# Patient Record
Sex: Female | Born: 1955 | Race: White | Hispanic: No | Smoking: Former smoker
Health system: Southern US, Community
[De-identification: ages and names within clinical notes are randomized; demographics above are authoritative.]

## PROBLEM LIST (undated history)

## (undated) DIAGNOSIS — E785 Hyperlipidemia, unspecified: Secondary | ICD-10-CM

## (undated) DIAGNOSIS — IMO0002 Reserved for concepts with insufficient information to code with codable children: Secondary | ICD-10-CM

## (undated) DIAGNOSIS — R229 Localized swelling, mass and lump, unspecified: Secondary | ICD-10-CM

## (undated) DIAGNOSIS — M1990 Unspecified osteoarthritis, unspecified site: Secondary | ICD-10-CM

## (undated) DIAGNOSIS — M25561 Pain in right knee: Secondary | ICD-10-CM

## (undated) DIAGNOSIS — F419 Anxiety disorder, unspecified: Secondary | ICD-10-CM

## (undated) DIAGNOSIS — D589 Hereditary hemolytic anemia, unspecified: Secondary | ICD-10-CM

## (undated) DIAGNOSIS — R011 Cardiac murmur, unspecified: Secondary | ICD-10-CM

## (undated) DIAGNOSIS — Z8489 Family history of other specified conditions: Secondary | ICD-10-CM

## (undated) DIAGNOSIS — I878 Other specified disorders of veins: Secondary | ICD-10-CM

## (undated) DIAGNOSIS — M25562 Pain in left knee: Secondary | ICD-10-CM

## (undated) DIAGNOSIS — I10 Essential (primary) hypertension: Secondary | ICD-10-CM

## (undated) DIAGNOSIS — R600 Localized edema: Secondary | ICD-10-CM

## (undated) DIAGNOSIS — K611 Rectal abscess: Secondary | ICD-10-CM

## (undated) DIAGNOSIS — J45909 Unspecified asthma, uncomplicated: Secondary | ICD-10-CM

## (undated) DIAGNOSIS — M2550 Pain in unspecified joint: Secondary | ICD-10-CM

## (undated) DIAGNOSIS — G8929 Other chronic pain: Secondary | ICD-10-CM

## (undated) HISTORY — DX: Other specified disorders of veins: I87.8

## (undated) HISTORY — DX: Unspecified osteoarthritis, unspecified site: M19.90

## (undated) HISTORY — DX: Pain in unspecified joint: M25.50

## (undated) HISTORY — DX: Essential (primary) hypertension: I10

## (undated) HISTORY — DX: Cardiac murmur, unspecified: R01.1

## (undated) HISTORY — DX: Localized edema: R60.0

## (undated) HISTORY — PX: COLONOSCOPY: SHX174

## (undated) HISTORY — DX: Hyperlipidemia, unspecified: E78.5

## (undated) HISTORY — DX: Rectal abscess: K61.1

## (undated) HISTORY — DX: Other chronic pain: G89.29

---

## 1999-09-15 ENCOUNTER — Emergency Department (HOSPITAL_COMMUNITY): Admission: EM | Admit: 1999-09-15 | Discharge: 1999-09-15 | Payer: Self-pay | Admitting: Internal Medicine

## 1999-09-15 ENCOUNTER — Encounter: Payer: Self-pay | Admitting: Internal Medicine

## 2000-01-17 ENCOUNTER — Other Ambulatory Visit: Admission: RE | Admit: 2000-01-17 | Discharge: 2000-01-17 | Payer: Self-pay | Admitting: Internal Medicine

## 2001-03-01 ENCOUNTER — Other Ambulatory Visit: Admission: RE | Admit: 2001-03-01 | Discharge: 2001-03-01 | Payer: Self-pay | Admitting: Obstetrics & Gynecology

## 2001-03-09 ENCOUNTER — Encounter (INDEPENDENT_AMBULATORY_CARE_PROVIDER_SITE_OTHER): Payer: Self-pay | Admitting: Specialist

## 2001-03-09 ENCOUNTER — Other Ambulatory Visit: Admission: RE | Admit: 2001-03-09 | Discharge: 2001-03-09 | Payer: Self-pay | Admitting: Obstetrics & Gynecology

## 2001-07-26 ENCOUNTER — Other Ambulatory Visit: Admission: RE | Admit: 2001-07-26 | Discharge: 2001-07-26 | Payer: Self-pay | Admitting: Obstetrics & Gynecology

## 2002-05-20 ENCOUNTER — Encounter: Payer: Self-pay | Admitting: Internal Medicine

## 2002-05-20 ENCOUNTER — Encounter: Admission: RE | Admit: 2002-05-20 | Discharge: 2002-05-20 | Payer: Self-pay | Admitting: Internal Medicine

## 2002-11-29 ENCOUNTER — Emergency Department (HOSPITAL_COMMUNITY): Admission: EM | Admit: 2002-11-29 | Discharge: 2002-11-29 | Payer: Self-pay | Admitting: Emergency Medicine

## 2002-11-29 ENCOUNTER — Encounter: Payer: Self-pay | Admitting: Emergency Medicine

## 2006-08-04 ENCOUNTER — Encounter: Admission: RE | Admit: 2006-08-04 | Discharge: 2006-08-04 | Payer: Self-pay | Admitting: Orthopedic Surgery

## 2008-03-06 ENCOUNTER — Ambulatory Visit: Payer: Self-pay | Admitting: Cardiovascular Disease

## 2008-03-06 ENCOUNTER — Inpatient Hospital Stay (HOSPITAL_COMMUNITY): Admission: EM | Admit: 2008-03-06 | Discharge: 2008-03-12 | Payer: Self-pay | Admitting: Emergency Medicine

## 2008-03-06 ENCOUNTER — Ambulatory Visit: Payer: Self-pay | Admitting: Oncology

## 2008-03-08 ENCOUNTER — Encounter (INDEPENDENT_AMBULATORY_CARE_PROVIDER_SITE_OTHER): Payer: Self-pay | Admitting: Internal Medicine

## 2008-03-13 ENCOUNTER — Ambulatory Visit: Payer: Self-pay | Admitting: Oncology

## 2008-03-17 LAB — CBC & DIFF AND RETIC
Basophils Absolute: 0 10*3/uL (ref 0.0–0.1)
EOS%: 0.2 % (ref 0.0–7.0)
Eosinophils Absolute: 0 10*3/uL (ref 0.0–0.5)
HCT: 32.8 % — ABNORMAL LOW (ref 34.8–46.6)
HGB: 11.7 g/dL (ref 11.6–15.9)
IRF: 0.41 — ABNORMAL HIGH (ref 0.130–0.330)
MCH: 36.1 pg — ABNORMAL HIGH (ref 26.0–34.0)
MCV: 101.3 fL — ABNORMAL HIGH (ref 81.0–101.0)
NEUT#: 10.1 10*3/uL — ABNORMAL HIGH (ref 1.5–6.5)
NEUT%: 92.9 % — ABNORMAL HIGH (ref 39.6–76.8)
RETIC #: 177.9 10*3/uL — ABNORMAL HIGH (ref 19.7–115.1)
lymph#: 0.5 10*3/uL — ABNORMAL LOW (ref 0.9–3.3)

## 2008-03-17 LAB — LACTATE DEHYDROGENASE: LDH: 531 U/L — ABNORMAL HIGH (ref 94–250)

## 2008-03-17 LAB — COMPREHENSIVE METABOLIC PANEL
ALT: 65 U/L — ABNORMAL HIGH (ref 0–35)
AST: 48 U/L — ABNORMAL HIGH (ref 0–37)
Albumin: 4.1 g/dL (ref 3.5–5.2)
CO2: 27 mEq/L (ref 19–32)
Calcium: 10.1 mg/dL (ref 8.4–10.5)
Chloride: 99 mEq/L (ref 96–112)
Creatinine, Ser: 1.21 mg/dL — ABNORMAL HIGH (ref 0.40–1.20)
Potassium: 4.4 mEq/L (ref 3.5–5.3)
Total Protein: 6.7 g/dL (ref 6.0–8.3)

## 2008-03-17 LAB — MORPHOLOGY

## 2008-04-06 LAB — CBC & DIFF AND RETIC
BASO%: 0.2 % (ref 0.0–2.0)
EOS%: 1.8 % (ref 0.0–7.0)
Eosinophils Absolute: 0.2 10*3/uL (ref 0.0–0.5)
LYMPH%: 15.7 % (ref 14.0–48.0)
MCH: 34.9 pg — ABNORMAL HIGH (ref 26.0–34.0)
MCHC: 35.1 g/dL (ref 32.0–36.0)
MCV: 99.6 fL (ref 81.0–101.0)
MONO%: 5.1 % (ref 0.0–13.0)
NEUT#: 6.4 10*3/uL (ref 1.5–6.5)
Platelets: 204 10*3/uL (ref 145–400)
RBC: 3.82 10*6/uL (ref 3.70–5.32)
RDW: 16.7 % — ABNORMAL HIGH (ref 11.3–14.5)
RETIC #: 98.2 10*3/uL (ref 19.7–115.1)
Retic %: 2.6 % — ABNORMAL HIGH (ref 0.4–2.3)

## 2008-04-06 LAB — MORPHOLOGY

## 2008-04-14 LAB — CBC & DIFF AND RETIC
BASO%: 0.3 % (ref 0.0–2.0)
Eosinophils Absolute: 0.1 10*3/uL (ref 0.0–0.5)
HCT: 39.6 % (ref 34.8–46.6)
LYMPH%: 15.7 % (ref 14.0–48.0)
MCHC: 35.2 g/dL (ref 32.0–36.0)
MCV: 98.9 fL (ref 81.0–101.0)
MONO#: 0.3 10*3/uL (ref 0.1–0.9)
MONO%: 2.7 % (ref 0.0–13.0)
NEUT%: 80.2 % — ABNORMAL HIGH (ref 39.6–76.8)
Platelets: 196 10*3/uL (ref 145–400)
WBC: 10.5 10*3/uL — ABNORMAL HIGH (ref 3.9–10.0)

## 2008-04-14 LAB — MORPHOLOGY

## 2008-04-14 LAB — HAPTOGLOBIN: Haptoglobin: 87 mg/dL (ref 16–200)

## 2008-04-21 LAB — CBC & DIFF AND RETIC
Basophils Absolute: 0 10*3/uL (ref 0.0–0.1)
EOS%: 0.2 % (ref 0.0–7.0)
Eosinophils Absolute: 0 10*3/uL (ref 0.0–0.5)
HCT: 37.6 % (ref 34.8–46.6)
HGB: 13.2 g/dL (ref 11.6–15.9)
IRF: 0.47 — ABNORMAL HIGH (ref 0.130–0.330)
MCH: 34.3 pg — ABNORMAL HIGH (ref 26.0–34.0)
MONO#: 0.5 10*3/uL (ref 0.1–0.9)
NEUT#: 9.5 10*3/uL — ABNORMAL HIGH (ref 1.5–6.5)
NEUT%: 80.5 % — ABNORMAL HIGH (ref 39.6–76.8)
lymph#: 1.8 10*3/uL (ref 0.9–3.3)

## 2008-04-21 LAB — MORPHOLOGY: PLT EST: ADEQUATE

## 2008-04-21 LAB — CHCC SMEAR

## 2008-04-28 ENCOUNTER — Ambulatory Visit: Payer: Self-pay | Admitting: Oncology

## 2008-04-28 LAB — CBC & DIFF AND RETIC
BASO%: 0.6 % (ref 0.0–2.0)
EOS%: 0.4 % (ref 0.0–7.0)
LYMPH%: 18.1 % (ref 14.0–48.0)
MCH: 33.4 pg (ref 26.0–34.0)
MCHC: 34.6 g/dL (ref 32.0–36.0)
MCV: 96.4 fL (ref 81.0–101.0)
MONO%: 4.5 % (ref 0.0–13.0)
Platelets: 253 10*3/uL (ref 145–400)
RBC: 3.87 10*6/uL (ref 3.70–5.32)
Retic %: 3.1 % — ABNORMAL HIGH (ref 0.4–2.3)

## 2008-04-28 LAB — COMPREHENSIVE METABOLIC PANEL
CO2: 28 mEq/L (ref 19–32)
Creatinine, Ser: 1.22 mg/dL — ABNORMAL HIGH (ref 0.40–1.20)
Glucose, Bld: 86 mg/dL (ref 70–99)
Sodium: 145 mEq/L (ref 135–145)
Total Bilirubin: 1.1 mg/dL (ref 0.3–1.2)
Total Protein: 6.1 g/dL (ref 6.0–8.3)

## 2008-05-10 LAB — CBC & DIFF AND RETIC
BASO%: 0.6 % (ref 0.0–2.0)
Basophils Absolute: 0.1 10*3/uL (ref 0.0–0.1)
HCT: 40.9 % (ref 34.8–46.6)
LYMPH%: 19.9 % (ref 14.0–48.0)
MCH: 32.7 pg (ref 26.0–34.0)
MCHC: 34.8 g/dL (ref 32.0–36.0)
MONO#: 0.4 10*3/uL (ref 0.1–0.9)
NEUT%: 74.9 % (ref 39.6–76.8)
Platelets: 240 10*3/uL (ref 145–400)
WBC: 9.6 10*3/uL (ref 3.9–10.0)

## 2008-05-10 LAB — BASIC METABOLIC PANEL
Calcium: 9.8 mg/dL (ref 8.4–10.5)
Chloride: 97 mEq/L (ref 96–112)
Creatinine, Ser: 1.13 mg/dL (ref 0.40–1.20)

## 2008-05-10 LAB — LACTATE DEHYDROGENASE: LDH: 306 U/L — ABNORMAL HIGH (ref 94–250)

## 2008-05-25 LAB — CBC & DIFF AND RETIC
BASO%: 0.4 % (ref 0.0–2.0)
LYMPH%: 23.6 % (ref 14.0–48.0)
MCH: 32.2 pg (ref 26.0–34.0)
MCHC: 35.1 g/dL (ref 32.0–36.0)
MCV: 91.7 fL (ref 81.0–101.0)
MONO%: 8.2 % (ref 0.0–13.0)
Platelets: 199 10*3/uL (ref 145–400)
RBC: 4.45 10*6/uL (ref 3.70–5.32)
Retic %: 3.2 % — ABNORMAL HIGH (ref 0.4–2.3)

## 2008-05-25 LAB — LACTATE DEHYDROGENASE: LDH: 288 U/L — ABNORMAL HIGH (ref 94–250)

## 2008-05-25 LAB — MORPHOLOGY

## 2008-05-31 ENCOUNTER — Ambulatory Visit: Payer: Self-pay | Admitting: Oncology

## 2008-06-08 LAB — CBC & DIFF AND RETIC
BASO%: 0.7 % (ref 0.0–2.0)
Eosinophils Absolute: 0.1 10*3/uL (ref 0.0–0.5)
LYMPH%: 20 % (ref 14.0–48.0)
MCHC: 34 g/dL (ref 32.0–36.0)
MONO#: 0.5 10*3/uL (ref 0.1–0.9)
NEUT#: 5.1 10*3/uL (ref 1.5–6.5)
RBC: 4.35 10*6/uL (ref 3.70–5.32)
RDW: 14.6 % — ABNORMAL HIGH (ref 11.3–14.5)
RETIC #: 110.1 10*3/uL (ref 19.7–115.1)
Retic %: 2.5 % — ABNORMAL HIGH (ref 0.4–2.3)
WBC: 7.3 10*3/uL (ref 3.9–10.0)
lymph#: 1.5 10*3/uL (ref 0.9–3.3)

## 2008-06-08 LAB — MORPHOLOGY: PLT EST: ADEQUATE

## 2008-06-15 LAB — CBC & DIFF AND RETIC
Eosinophils Absolute: 0.1 10*3/uL (ref 0.0–0.5)
MCV: 92.7 fL (ref 81.0–101.0)
MONO%: 8.1 % (ref 0.0–13.0)
NEUT#: 4 10*3/uL (ref 1.5–6.5)
RBC: 4.49 10*6/uL (ref 3.70–5.32)
RDW: 14.7 % — ABNORMAL HIGH (ref 11.3–14.5)
RETIC #: 113.6 10*3/uL (ref 19.7–115.1)
Retic %: 2.5 % — ABNORMAL HIGH (ref 0.4–2.3)
WBC: 6.2 10*3/uL (ref 3.9–10.0)

## 2008-06-29 LAB — CBC & DIFF AND RETIC
BASO%: 0.3 % (ref 0.0–2.0)
LYMPH%: 25.8 % (ref 14.0–48.0)
MCHC: 34.6 g/dL (ref 32.0–36.0)
MCV: 90.5 fL (ref 81.0–101.0)
MONO%: 8.8 % (ref 0.0–13.0)
Platelets: 191 10*3/uL (ref 145–400)
RBC: 4.57 10*6/uL (ref 3.70–5.32)
Retic %: 2 % (ref 0.4–2.3)
WBC: 6.3 10*3/uL (ref 3.9–10.0)

## 2008-07-06 LAB — CBC & DIFF AND RETIC
BASO%: 0.7 % (ref 0.0–2.0)
EOS%: 3.5 % (ref 0.0–7.0)
HCT: 41.5 % (ref 34.8–46.6)
LYMPH%: 27.4 % (ref 14.0–48.0)
MCH: 31 pg (ref 26.0–34.0)
MCHC: 34.2 g/dL (ref 32.0–36.0)
NEUT%: 60.2 % (ref 39.6–76.8)
Platelets: 193 10*3/uL (ref 145–400)

## 2008-07-11 ENCOUNTER — Ambulatory Visit: Payer: Self-pay | Admitting: Oncology

## 2008-07-13 LAB — CBC & DIFF AND RETIC
Basophils Absolute: 0 10*3/uL (ref 0.0–0.1)
Eosinophils Absolute: 0.2 10*3/uL (ref 0.0–0.5)
HCT: 41.6 % (ref 34.8–46.6)
HGB: 14.1 g/dL (ref 11.6–15.9)
IRF: 0.43 — ABNORMAL HIGH (ref 0.130–0.330)
MCV: 90.3 fL (ref 81.0–101.0)
MONO%: 7.2 % (ref 0.0–13.0)
NEUT#: 4.6 10*3/uL (ref 1.5–6.5)
NEUT%: 68.6 % (ref 39.6–76.8)
RDW: 14.3 % (ref 11.3–14.5)
RETIC #: 72.8 10*3/uL (ref 19.7–115.1)
lymph#: 1.4 10*3/uL (ref 0.9–3.3)

## 2008-07-21 LAB — FERRITIN: Ferritin: 219 ng/mL (ref 10–291)

## 2008-07-21 LAB — IRON AND TIBC
%SAT: 26 % (ref 20–55)
Iron: 76 ug/dL (ref 42–145)
TIBC: 295 ug/dL (ref 250–470)

## 2008-07-21 LAB — COMPREHENSIVE METABOLIC PANEL
Albumin: 4 g/dL (ref 3.5–5.2)
Alkaline Phosphatase: 82 U/L (ref 39–117)
BUN: 23 mg/dL (ref 6–23)
Glucose, Bld: 100 mg/dL — ABNORMAL HIGH (ref 70–99)
Total Bilirubin: 1 mg/dL (ref 0.3–1.2)

## 2008-07-21 LAB — CBC & DIFF AND RETIC
BASO%: 0.3 % (ref 0.0–2.0)
EOS%: 3.5 % (ref 0.0–7.0)
LYMPH%: 22.8 % (ref 14.0–48.0)
MCHC: 33.8 g/dL (ref 32.0–36.0)
MCV: 90.3 fL (ref 81.0–101.0)
MONO%: 7.1 % (ref 0.0–13.0)
Platelets: 189 10*3/uL (ref 145–400)
RBC: 4.53 10*6/uL (ref 3.70–5.32)
Retic %: 2.1 % (ref 0.4–2.3)

## 2008-07-21 LAB — MORPHOLOGY

## 2008-08-21 LAB — CBC WITH DIFFERENTIAL/PLATELET
BASO%: 0.3 % (ref 0.0–2.0)
HCT: 41 % (ref 34.8–46.6)
LYMPH%: 27.3 % (ref 14.0–48.0)
MCH: 30.2 pg (ref 26.0–34.0)
MCHC: 33.9 g/dL (ref 32.0–36.0)
MCV: 89.2 fL (ref 81.0–101.0)
MONO#: 0.5 10*3/uL (ref 0.1–0.9)
MONO%: 8.3 % (ref 0.0–13.0)
NEUT%: 60.3 % (ref 39.6–76.8)
Platelets: 180 10*3/uL (ref 145–400)
RBC: 4.6 10*6/uL (ref 3.70–5.32)

## 2008-08-21 LAB — MORPHOLOGY: PLT EST: ADEQUATE

## 2008-09-14 ENCOUNTER — Ambulatory Visit: Payer: Self-pay | Admitting: Oncology

## 2008-10-16 LAB — CBC WITH DIFFERENTIAL/PLATELET
BASO%: 0.4 % (ref 0.0–2.0)
EOS%: 2.6 % (ref 0.0–7.0)
LYMPH%: 23.8 % (ref 14.0–48.0)
MCHC: 34.4 g/dL (ref 32.0–36.0)
MCV: 89.3 fL (ref 81.0–101.0)
MONO%: 7.3 % (ref 0.0–13.0)
NEUT#: 4 10*3/uL (ref 1.5–6.5)
Platelets: 170 10*3/uL (ref 145–400)
RBC: 4.71 10*6/uL (ref 3.70–5.32)
RDW: 14.3 % (ref 11.3–14.5)

## 2008-10-16 LAB — MORPHOLOGY

## 2008-11-17 ENCOUNTER — Ambulatory Visit: Payer: Self-pay | Admitting: Oncology

## 2008-12-12 LAB — COMPREHENSIVE METABOLIC PANEL
ALT: 22 U/L (ref 0–35)
AST: 21 U/L (ref 0–37)
Albumin: 4.2 g/dL (ref 3.5–5.2)
BUN: 24 mg/dL — ABNORMAL HIGH (ref 6–23)
CO2: 29 mEq/L (ref 19–32)
Calcium: 9.8 mg/dL (ref 8.4–10.5)
Chloride: 100 mEq/L (ref 96–112)
Creatinine, Ser: 0.95 mg/dL (ref 0.40–1.20)
Potassium: 3.9 mEq/L (ref 3.5–5.3)

## 2008-12-12 LAB — CBC & DIFF AND RETIC
Basophils Absolute: 0 10*3/uL (ref 0.0–0.1)
EOS%: 3.1 % (ref 0.0–7.0)
Eosinophils Absolute: 0.2 10*3/uL (ref 0.0–0.5)
HCT: 43.8 % (ref 34.8–46.6)
HGB: 15.2 g/dL (ref 11.6–15.9)
IRF: 0.25 (ref 0.130–0.330)
MCH: 31.5 pg (ref 26.0–34.0)
MCV: 90.8 fL (ref 81.0–101.0)
MONO%: 8.1 % (ref 0.0–13.0)
NEUT#: 3.9 10*3/uL (ref 1.5–6.5)
NEUT%: 63.8 % (ref 39.6–76.8)
RDW: 13.6 % (ref 11.3–14.5)
RETIC #: 67 10*3/uL (ref 19.7–115.1)

## 2008-12-12 LAB — MORPHOLOGY

## 2008-12-12 LAB — LACTATE DEHYDROGENASE: LDH: 180 U/L (ref 94–250)

## 2009-08-09 ENCOUNTER — Emergency Department (HOSPITAL_COMMUNITY): Admission: EM | Admit: 2009-08-09 | Discharge: 2009-08-09 | Payer: Self-pay | Admitting: Emergency Medicine

## 2010-01-27 IMAGING — CR DG CHEST 1V PORT
1 series · 1 of 1 positions shown · non-contrast
Comparison: No priors available

CLINICAL DATA: Chest discomfort.  Weakness.

PORTABLE CHEST - 1 VIEW

[view not recorded]
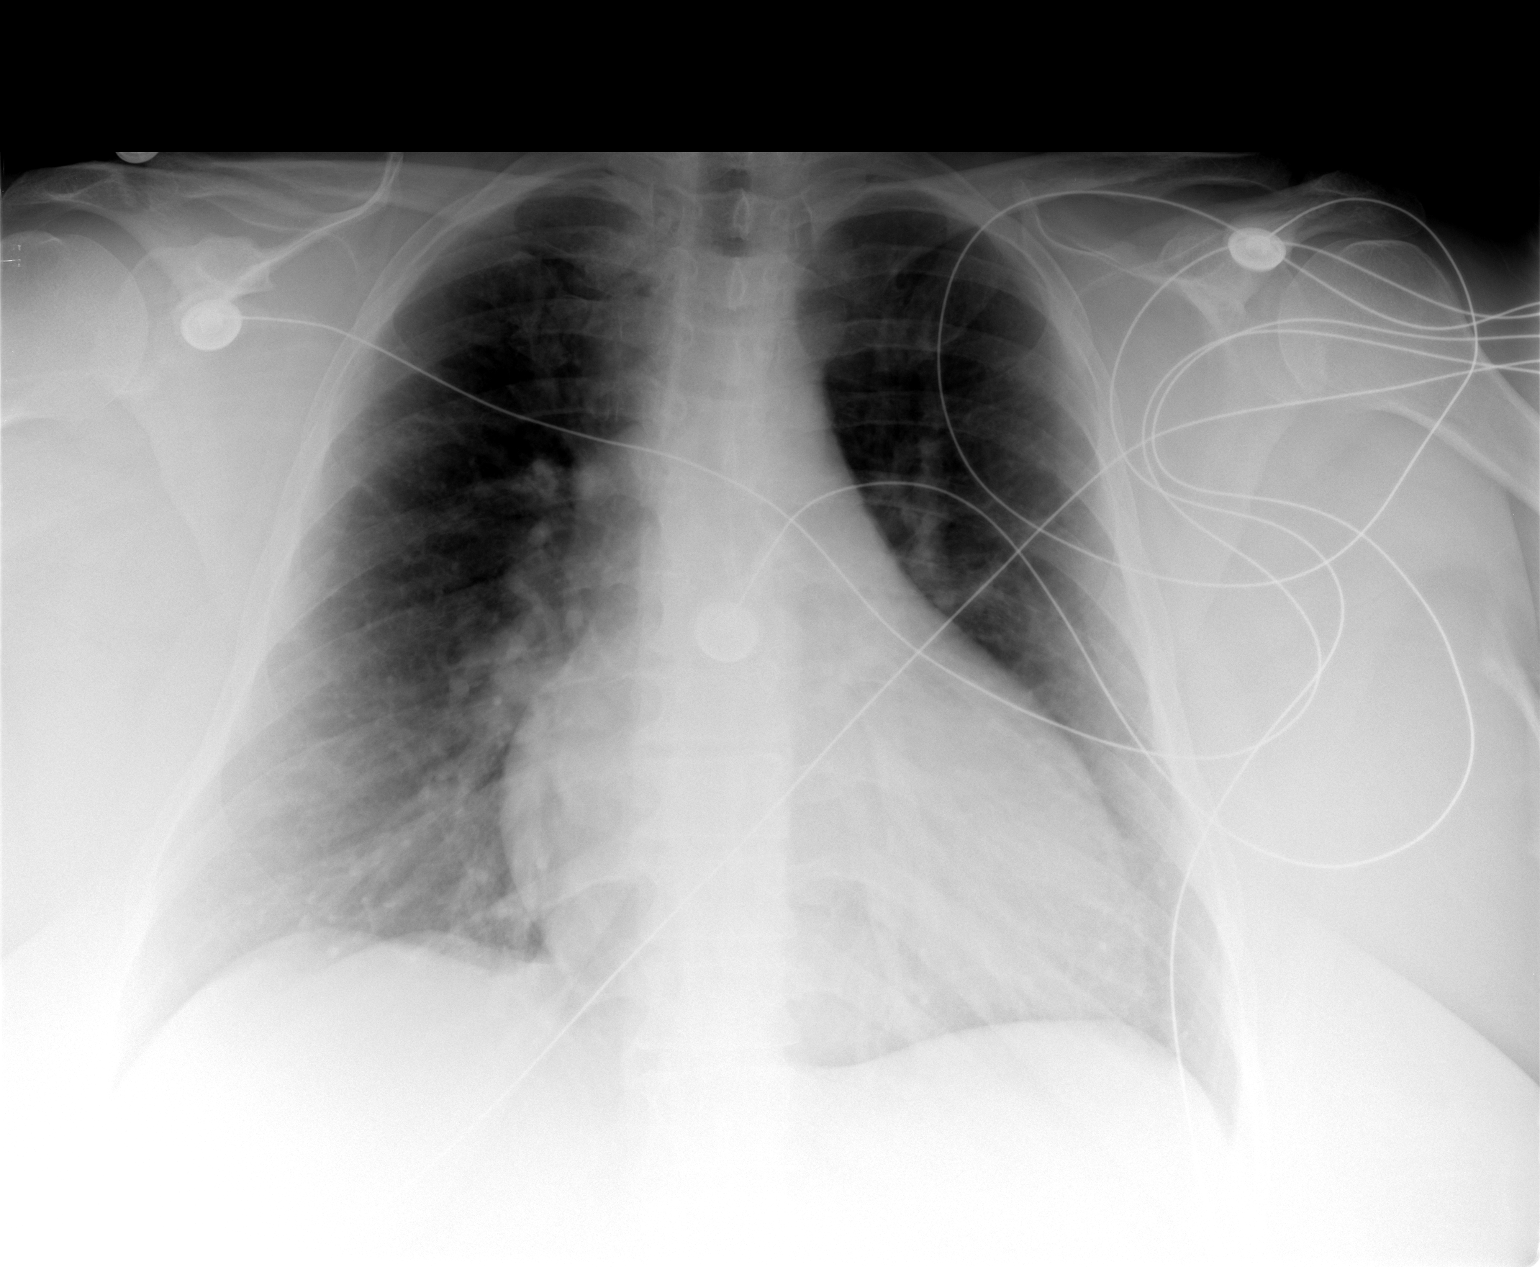

[1 of 1 positions shown; findings below may reference images not displayed]

FINDINGS: Mild to moderate cardiomegaly is seen as well as
pulmonary vascular congestion.  There is no evidence of pulmonary
infiltrate or edema.  There is no evidence of pleural effusion.
IMPRESSION: Cardiomegaly and pulmonary vascular congestion.  No acute findings.

## 2010-01-28 IMAGING — CT CT PELVIS W/ CM
2 of 5 series · 16 of 46 positions shown, 18 images · IV contrast (agent unspecified)
Comparison: Plain film 03/06/2008.

CT CHEST

CLINICAL DATA: FATIGUE.  DYSPNEA.  ELEVATED BILIRUBIN.  LOW
PLATELET COUNT.  ELEVATED WHITE BLOOD CELL COUNT.

CT CHEST, ABDOMEN AND PELVIS WITH CONTRAST
TECHNIQUE: Contiguous axial images of the chest abdomen and pelvis
were obtained after IV contrast administration.
Contrast: 125 ml [id number]-6CC.

[Series 2: cap xxl 5.0 b40s · axial · 0.81mm/px · z∈[-591,-71]mm · 13 of 118 slices shown, 15 images]
[im 7/118  soft-tissue]
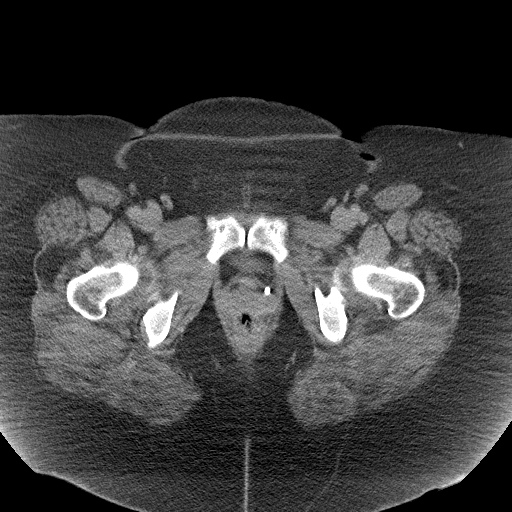
[im 7/118  bone]
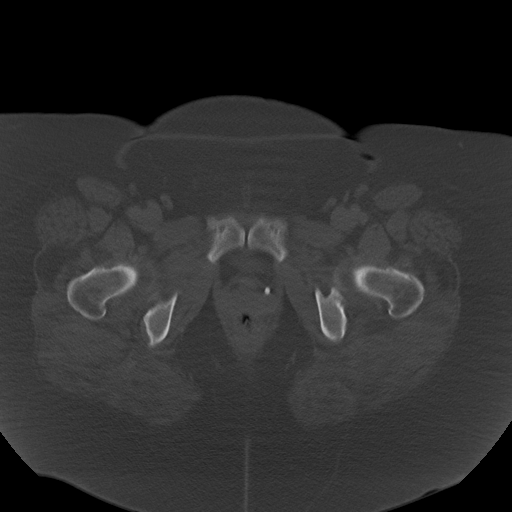
[im 14/118  soft-tissue]
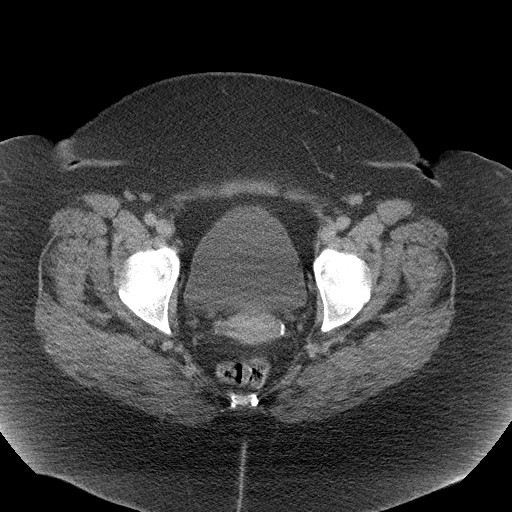
[im 28/118  soft-tissue]
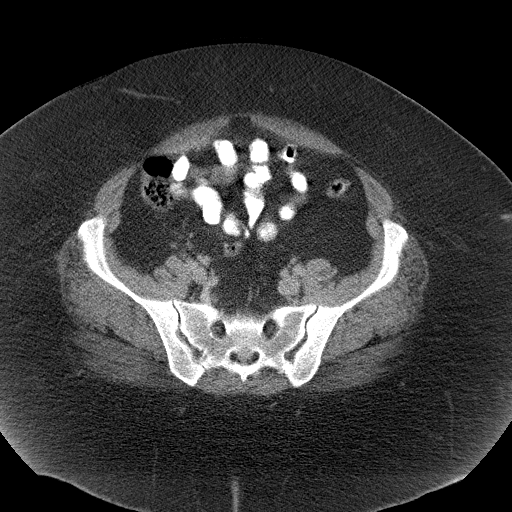
[im 35/118  soft-tissue]
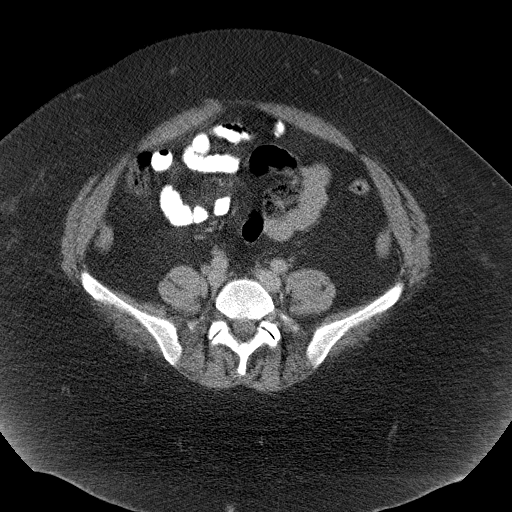
[im 42/118  soft-tissue]
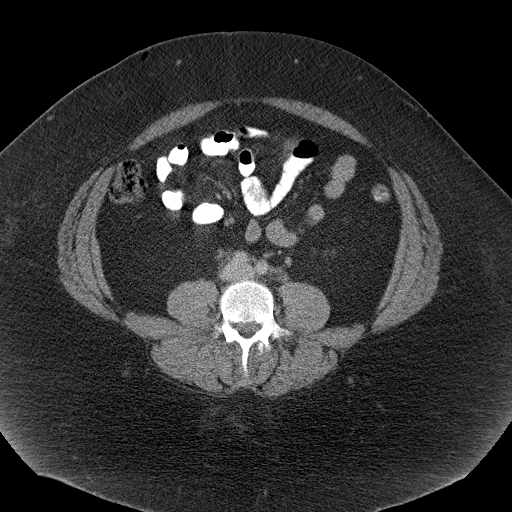
[im 49/118  soft-tissue]
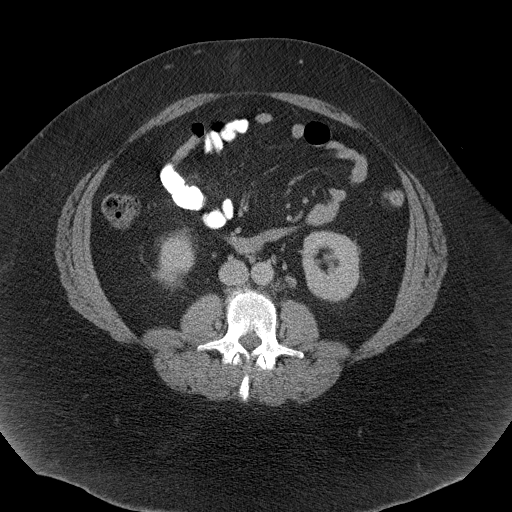
[im 62/118  soft-tissue]
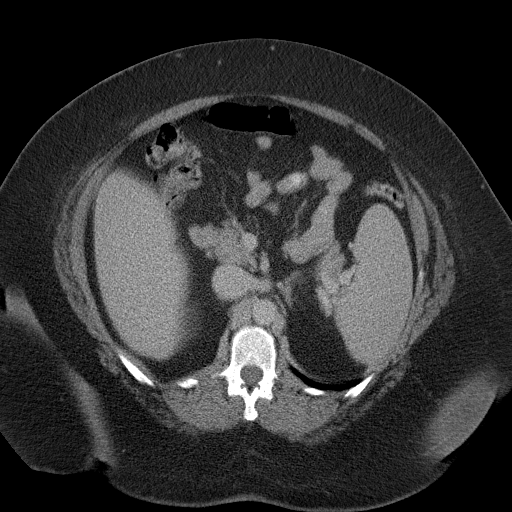
[im 69/118  soft-tissue]
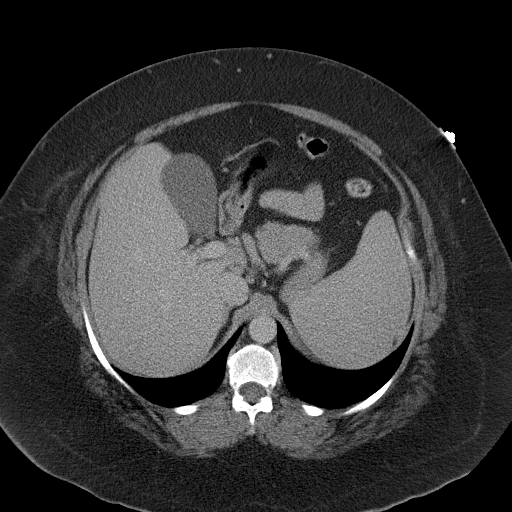
[im 76/118  soft-tissue]
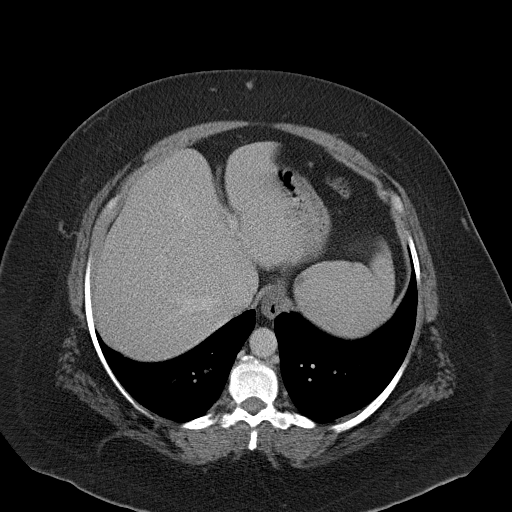
[im 76/118  bone]
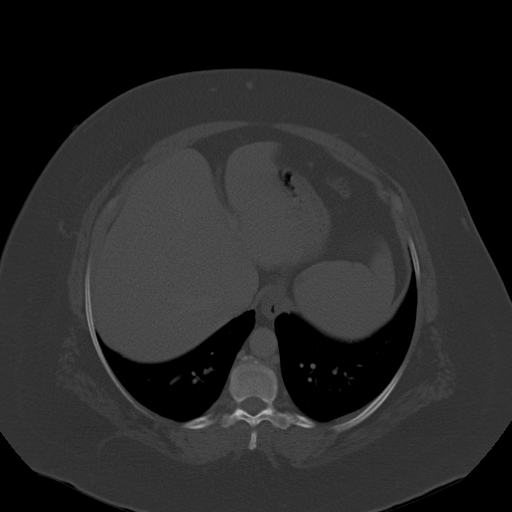
[im 83/118  soft-tissue]
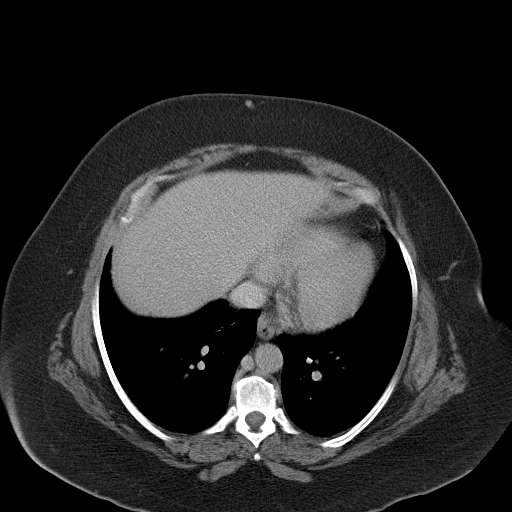
[im 90/118  soft-tissue]
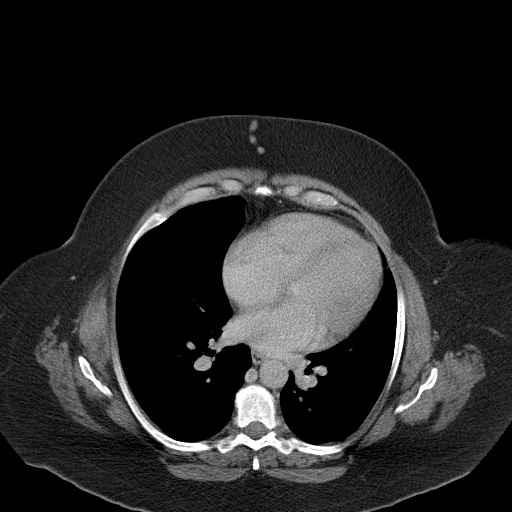
[im 104/118  soft-tissue]
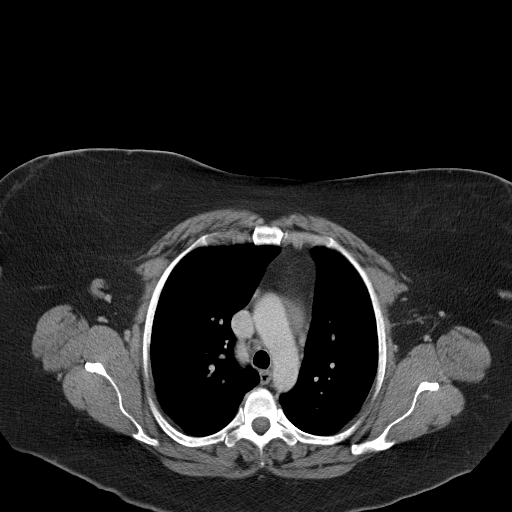
[im 111/118  soft-tissue]
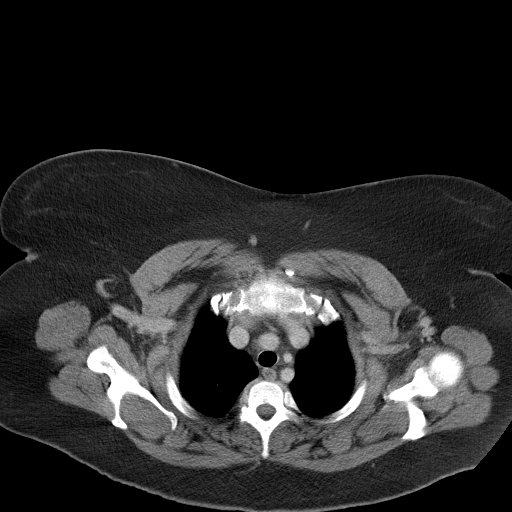

[Series 602: <mpr thick range> · coronal · 1.15mm/px · 3 of 102 slices shown]
[im 34/102  soft-tissue]
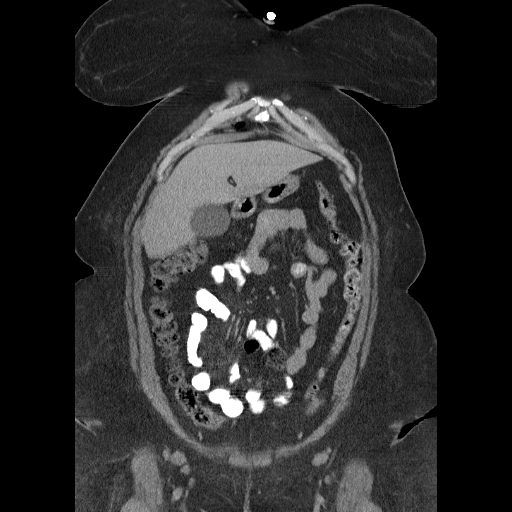
[im 45/102  soft-tissue]
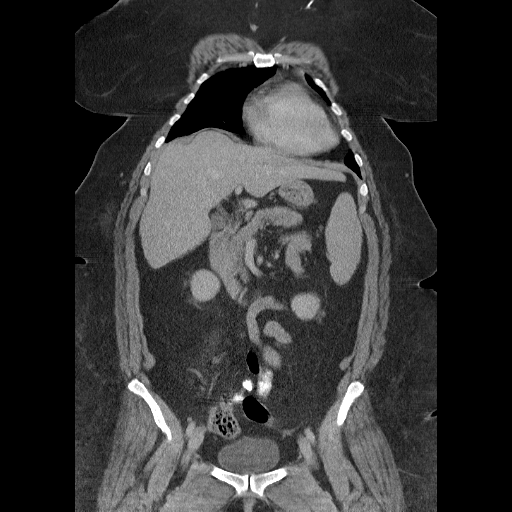
[im 57/102  soft-tissue]
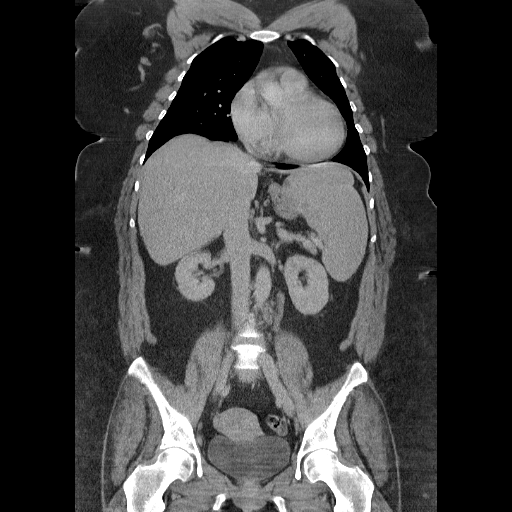

[16 of 46 positions shown; findings below may reference images not displayed]

FINDINGS: Lung windows demonstrate mild motion artifact at the lung
bases.  No nodules, airspace opacities.

Soft tissue windows demonstrate no axillary adenopathy.  Mild
cardiomegaly.  No pericardial or pleural effusion.  No mediastinal
adenopathy.  Evaluation of the hila is mildly limited by patient's
size and suboptimal contrast enhancement.  Tiny hiatal hernia.
IMPRESSION: 1.  No acute process in the chest.

CT ABDOMEN
FINDINGS: Normal liver.  The spleen is mildly enlarged measuring
9.4 x 13.3 cm transverse and 13.5 cm cranial caudal dimension.
Mild motion artifact involves the abdomen as well.  Normal
pancreas, gallbladder, biliary tract , adrenal glands, right
kidney.  Left upper pole renal cyst.

A left para-aortic retroperitoneal lymph node measures 1.6 x 2.3 cm
on image 34.  This appears to maintain its fatty hilum.  More
superior node measures 1.1 x 1.2 cm image 66.  No retrocrural
adenopathy.

Normal colon, appendix, and terminal ileum.

normal abdominal small bowel without ascites.
IMPRESSION: 1.  Mild splenomegaly and retroperitoneal adenopathy.  Findings are
nonspecific.  In the right clinical setting, early lymphoma could
have this appearance.  If this is a clinical concern, further
evaluation with CT PET may be informative.  Alternatively, the
abdominal abnormalities may be reactive and could be reevaluated
with follow-up CT at 2- 3 months.

CT PELVIS
FINDINGS: Sigmoid colon diverticulosis.  Normal pelvic small bowel
loops.  8 mm right external iliac lymph node on image 103 is not
pathologic by size criteria.  Normal urinary bladder and uterus.
Normal bones.
IMPRESSION: 1.  No acute process in the pelvis.

## 2011-03-25 NOTE — Consult Note (Signed)
Maria Stone            ACCOUNT NO.:  [id number]   MEDICAL RECORD NO.:  [id number]          PATIENT TYPE:  INP   LOCATION:  1401                         FACILITY:  [facility]   PHYSICIAN:  Pierce Crane, MD        DATE OF BIRTH:  1956/10/26   DATE OF CONSULTATION:  03/06/2008  DATE OF DISCHARGE:                                 CONSULTATION   Please refer to notes from Maria Stone, P.A.   Maria Stone is a pleasant 55 year old woman in general good health.  She has a history of osteoarthritis, has been on Lodine for the past 2-3  weeks.  She has a 2 to 3-week history of progressive fatigue, shortness  of breath on exertion, and frank exhaustion.  She also has been seen in  the ER.  Hemoglobin was found to be 5.8.  She was admitted to the  hospital.  In association with her labs, she was also found to have a  white count of 8.3, a platelet count of 153.  Smear revealed  polychromasia, no leukoblasts.  Coags normal.  LDH elevated at 612.  Bilirubin noted to be elevated at 4.9, mostly indirect.  Reticulocyte  count is 23%.  She does have warm autoantibodies noted on a cross match  as well.  She is in the process of receiving her second unit of packed  red blood cells with no ill effects.   PAST MEDICAL HISTORY:  Significant for:  1. History of asthma.  2. History of murmur.  3. History of obesity.  4. She has previously been on Lasix for peripheral edema.  5. She has been on a variety of different blood pressure medications,      initially Prinivil, then Benicar, now off all other medications.   FAMILY HISTORY:  She has a positive family history of the mother having  been treated for aplastic anemia.  She has a father, brother, and sister  all who have history of diabetes.   SOCIAL HISTORY:  She is divorced, has one adult child, age 44.  She is  originally from [place], moving to this area about 12 years ago.  She does not smoke or drink.   ALLERGIES:  CODEINE and  PENICILLIN.   REVIEW OF SYSTEMS:  She denied any bleeding or bruising, no other  rashes, joint pains.   PHYSICAL EXAMINATION:  GENERAL:  This is an obese woman looking her  stated age.  She has no palpable peripheral adenopathy.  BREASTS:  Not examined.  Axillae normal.  ABDOMEN:  Prominent.  I cannot appreciate any obvious hepatomegaly.  There may be a spleen that is prominent and/or palpable.  I cannot  appreciate any mass.  EXTREMITIES:  She has some peripheral edema.   Smear not examined as of yet.   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  She has been given blood because of  profound anemia.  We will start her on steroids this evening and follow  her LDH and  reticulocyte count.  Because of her  weight, she will need to have her blood sugars monitored fairly  carefully.  She may require sliding scale insulin.  She should also  receive DVT prophylaxis as well.  On exam, it is also noted that she has  scleral icterus.  We will plan to go ahead with CT scan in the morning  as well.      Pierce Crane, MD  Electronically Signed     PR/MEDQ  D:  03/06/2008  T:  03/06/2008  Job:  [id number]

## 2011-03-25 NOTE — H&P (Signed)
Maria Stone, BETTEN            ACCOUNT NO.:  [id number]   MEDICAL RECORD NO.:  [id number]          PATIENT TYPE:  EMS   LOCATION:  ED                           [facility]ACILITY[facility]  [facility]   PHYSICIAN:  Herbie Saxon, MDDATE OF BIRTH:  1956/04/03   DATE OF ADMISSION:  03/06/2008  DATE OF DISCHARGE:                              HISTORY & PHYSICAL   PRIMARY CARE PHYSICIAN:  Dr. Josiah Lobo in [facility].   She has no health care power of attorney.  She is a full code.   PRESENTING COMPLAINT:  Weakness, dizziness, one week.   HISTORY OF PRESENTING COMPLAINT:  This is a 55 year old Caucasian lady  who has been feeling extremely weak in the last six weeks with episodic  dizziness, lightheadedness, and a ringing sensation in the ears.  She  was initially seen by her primary care physician, who switched her blood  pressure medication from Prinivil to Benicar after low renal doses  __________  it was hypotension causing these episodes.  However, the  symptoms persisted with dyspnea on mild exertion, occasional  palpitation.  Patient also has chronic bilateral leg swelling, for which  she uses a diuretic.  She denies any bleeding from orifices, no  hematuria, no melena, no hematemesis, no nausea or vomiting.  There is  no diarrhea, constipation, no frequency of urination.  No syncopal  episodes.  No headache.  Patient denies any new skin rash or joint  swelling.  No bruising from the skin.  She has witnessed an  unintentional weight loss of 10 pounds in the last two months.  She does  not have any persistent flu-like symptoms.  She does not use any  antibiotics at the onset of all of this weakness and dizziness.   PAST MEDICAL HISTORY:  Hypertension.  Also includes hemorrhoids.   PAST SURGICAL HISTORY:  Nil.   FAMILY HISTORY:  Father, brother, sister all have diabetes.  Mother,  aplastic anemia.   SOCIAL HISTORY:  She is divorced.  She has one child.  She works as a  Astronomer  at [employer].  She quit smoking more than 30 years ago.  She  had smoked for less than five years, less than one pack per day.  There  is no history of significant alcohol.  There is no history of drug  abuse.   ALLERGIES:  CODEINE, PENICILLIN.   MEDICATIONS:  Prinivil.  Benicar, totally not using.  A total of 500 mg  b.i.d.   REVIEW OF SYSTEMS:  Fourteen systems reviewed.  Pertinent positives as  in the history of presenting illness.   PHYSICAL EXAMINATION:  She is a middle-aged lady, obese.  Not in acute  respiratory distress.  She is anxious.  Temperature 98, pulse 118, respiratory rate 18.  Blood pressure is  137/53.  Pale clinically.  No jaundice.  There is no clubbing or cyanosis.  Head is normocephalic and atraumatic.  Mucous membranes are moist.  No  submandibular lymphadenopathy.  Oropharynx and nasopharynx are clear.  There is no elevated JVD.  CHEST:  She has reduced breath sounds at the bases.  Heart  sounds 1, 2, and 3.  Tachycardic.  ABDOMEN:  Soft and nontender.  She has truncal obesity.  No organomegaly  palpable.  She is alert and oriented to time, place, and person.  Power is 5 in all  limbs.  Peripheral pulses reduced.  She has +2 pedal edema.  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  Cranial nerves II-XII are intact.  No new skin rash.  No joint  effusions.   Available labs show that the hematocrit is 17.6, WBC 18, platelet count  153.  __________  shows polychromasia and leukoblasts.  The chemistry  shows a sodium of 141, potassium 3.5, chloride 103, bicarbonate 32,  glucose 119, BUN 16, creatinine 1.1.  She does have 1-2 antibodies.   Chest x-ray shows cardiomegaly and pulmonary vascular congestion.  No  acute process.   EKG:  Sinus tachycardia at 107 per minute.   ASSESSMENT:  1. 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      Rule out leukemia.  2. Sinus tachycardia.  3. Pulmonary vascular congestion.  4. Morbid  obesity.  5. Hypertension, stable.  6. History of hemorrhoids.   Patient is to be admitted to a telemetry bed.  Will type and crossmatch  4 units of packed red blood cells, transfuse 2 units of packed red blood  cells with 40 mg IV Lasix after each transfusion.  Anemia workup.  Iron  studies.  Vitamin B12, folate.  Peripheral blood flow cytometry, ESR,  haptoglobin, hemoccult x2, Coombs test of the serum, and urine protein  electrophoresis.  Lupus anticoagulant antibody.  Ferritin and __________  will repeat it.  Obtain a thyroid function test, with a , BNP, lipids  and a coagulation parameters.  Cardiac enzymes x1.  Will get a  hematology evaluation for possible bone marrow biopsy and lymph node  biopsy.  Watch carefully for blood transfusion reaction.  Continue with  HCTZ 25 mg daily and KCL 10 mEq daily.  Will obtain a 2D echocardiogram.  DVT prophylaxis.  Lovenox 40 mg subcu daily.  Protonix 40 mg IV daily  for GI  prophylaxis.  Duonebs 3 ml q.6h. p.r.n.  __________ ; however,  we will keep her IV hep-locked for now.  Activity will be bedrest.  Orthostatic blood pressure checks in the morning.      Herbie Saxon, MD  Electronically Signed     MIO/MEDQ  D:  03/06/2008  T:  03/06/2008  Job:  [id number]   cc:   Josiah Lobo  Fax: [phone]

## 2011-03-25 NOTE — Consult Note (Signed)
Maria Stone, Maria Stone            ACCOUNT NO.:  [id number]   MEDICAL RECORD NO.:  [id number]          PATIENT TYPE:  INP   LOCATION:  1401                         FAC[facility]  [facility]   PHYSICIAN:  Dr. Donnie Stone              DATE OF BIRTH:  February 01, 1956   DATE OF CONSULTATION:  03/06/2008  DATE OF DISCHARGE:                                 CONSULTATION   This is an hour man dictating for Dr. Recardo Stone breath Maria Stone date of  consult.  Please marked March 06, 2008 may sure is March 06, 2008 in  March 06, 2008.   REFERRING PHYSICIAN:   CONSULTING PHYSICIAN:  Dr. Donnie Stone   REASON FOR CONSULTATION:  Rule out autoimmune anemia.   HISTORY OF PRESENT ILLNESS:  Maria Stone is a 55 year old woman whom we  asked to see for evaluation of autoimmune anemia.  She presented to the  emergency department with a 1-week history of weakness, dizziness,  anorexia, and myalgia.  She was found to have a hemoglobin of 5.6.  Blood films showed a marked anemia with prominent polychromasia, and  rare normal red blood cells, but no leukoblasts.  Her hemoglobin and  hematocrit, prior to receiving transfusion, was 5.8 and 16.4  respectively.  Her retic count is 28%; she has indirect bilirubin of  4.5; and her LDH is elevated.  Her warm antibodies are consistent with  autoimmune anemia.  Currently, she is receiving a second unit of packed  rbc's to improve her count.  Based on these findings, we were asked to  see her, with recommendations regarding her care.  Of note, it is  important to mention, that a chest x-ray shows pulmonary and vascular  congestion, as well as cardiomegaly.  Her 2-D echo is pending.  Her  mother has a diagnosis of aplastic anemia, requiring transfusion, until  she had a hysterectomy performed at age 68.  The patient had been  checked as a child, and this was negative.  This may need to be followed  up while in the hospital.   PAST MEDICAL HISTORY:  1. Hypertension.  2. Full code.  3.  Morbid obesity.  4. History of hemorrhoids.  5. Menopausal.  6. Asthma.  7. 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  8. 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   SURGERIES:  None.   ALLERGIES:  CODEINE, PENICILLIN.   MEDICATIONS:  Lovenox, Phenergan, Protonix, albuterol, Tylenol,  oxycodone, Senokot, HCTZ, KCl.   REVIEW OF SYSTEMS:  Remarkable for night sweats, but the patient admits  to be menopausal, dyspnea on exertion, hacking cough secondary to  Prinivil, but after the discontinuing the medication, this resolved.  She has intermittent diarrhea.  Extremely fatigued.  No recent air  travel.  No surgical complications such as the present situation.  Her  last menstrual period was in October 2008.  The rest of the review of  systems is negative.   FAMILY HISTORY:  Mother alive, with  a history of aplastic anemia and  iron deficiency.  Father alive with a history of diabetes, one sister  with thyroid disease and osteoarthritis.   SOCIAL HISTORY:  The patient is divorced, one grown child.  No tobacco  or alcohol abuse.  Lives in [place].  Catholic.  Last physical was 6  months ago, with normal lab values.   PHYSICAL EXAMINATION:  GENERAL:  On physical exam this is an obese 56-  year-old white female in no acute distress, alert and oriented x3.  VITAL SIGNS:  Blood pressure 173/45, pulse 109, respirations 20,  temperature 98.8, pulse oximetry 96% on room air.  HEENT:  Normocephalic, atraumatic.  Sclerae anicteric.  Oral cavity  without thrush or lesions.  NECK:  Supple.  No cervical or supraclavicular masses.  LUNGS:  Clear to auscultation bilaterally.  CARDIOVASCULAR:  Regular rate and rhythm with a soft 1/6 short systolic  murmur.  No rubs or gallops.  ABDOMEN:  Obese, nontender.  Bowel sounds x4.  No palpable hepatic  border, but there is a question of a splenomegaly.   No adenopathy.  EXTREMITIES:  With no clubbing or cyanosis.  No edema.  No inguinal  masses.  SKIN:  Without bruising or petechial rash.  No jaundice.  BREASTS:  Not examined.  GENITOURINARY AND RECTAL:  Deferred.  MUSCULOSKELETAL:  With no spinal tenderness.  NEURO:  Nonfocal.   LABS:  Hemoglobin 5.8, hematocrit 16.4, white count 8.6, platelets 135,  MCV 117.6, ANC 5.4, monocytes 0.6, lymphocytes 2.2.  SPEP and UPEP  pending, flow cytometry pending, hemoccult pending, haptoglobin pending.  Iron studies pending.  Sed rate pending.  All the other labs as  mentioned in HPI.  PTT 25.  PT 14.2, INR 1.1.  Sodium 141, potassium  3.5, BUN 16, creatinine 1.14, glucose 119, calcium 9.1.  Past review  shows marked anemia with prominent polychromasia, and rare normal red  blood cells, no leukoblasts, and there is basophilic stippling.  Normal  platelets.   ASSESSMENT/PLAN:  Dr. Donnie Stone has seen and evaluated the patient and  reviewed the chart. This is a 55 year old white female admitted, with  potential hemolytic anemia, autoimmune, currently receiving the second  unit of packed rbc's, with no reaction.  On exam, a questionable  splenomegaly was suspected, rule out underlying lymphoma.  We will begin  IV steroids, and will monitor her CBGs. DVT prophylaxis is important.  We will await for the other  results to return, and once these become available, will proceed with  further recommendations.  At this time, will monitor any changes.  Will  continue with the transfusion.  Thank you very much for allowing [name] the  opportunity to participate in the care of this nice patient.      Maria Stone, P.A.    ______________________________  Dr. Donnie Stone    SW/MEDQ  D:  03/10/2008  T:  03/10/2008  Job:  [id number]   cc:   Maria Stone, M.D.  Fax: [phone]   Maria Stone, M.D.  Fax: [phone]   Maria Stone  Fax: [phone]

## 2011-03-25 NOTE — Discharge Summary (Signed)
Maria Stone, Maria Stone            ACCOUNT NO.:  [id number]   MEDICAL RECORD NO.:  [id number]          PATIENT TYPE:  INP   LOCATION:  1401                         FACILITY:  [facility]   PHYSICIAN:  Hettie Holstein, D.O.    DATE OF BIRTH:  19-Apr-1956   DATE OF ADMISSION:  03/06/2008  DATE OF DISCHARGE:  03/12/2008                               DISCHARGE SUMMARY   PRIMARY CARE PHYSICIAN:  Dr. Josiah Lobo in [facility].   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  Donnie Coffin and Dr. [name] Chime.  Her studies revealed a warm antibody and she  responded to 120 mg dose of prednisone.   SECONDARY DIAGNOSES:  1. Hypertension with initiation of hydrochlorothiazide this admission,      suspect that this is related to initiation of steroid therapy.  2. Obesity.  3. History of asthma.  4. History of mild renal insufficiency, though her creatinine at the      time of discharge was 1.16 with an estimated GFR of 49.  This will      require followup with her primary care physician.  5. Low HDL and elevated LDL.  Her fasting lipid profile revealed a      total cholesterol/HDL ratio that places her in the increased risk      for coronary disease.  Her total cholesterol is 160, LDL is 100,      HDL 22 with a cholesterol/HDL ratio of 7.3.   DISCHARGE MEDICATIONS:  She is being discharged on:  1. Prednisone 60 mg b.i.d. as prescribed by Dr. [name] Chime with      instructions to follow up this week and prior to returning to work      to see Dr. Donnie Coffin at which time it is anticipated this will likely      be tapered after 2-4 weeks of high dose therapy, especially if her      counts continue to respond.  2. Glucophage.  She did not have a formal diagnosis of diabetes.  In      fact her hemoglobin A1c was 3.6 but her therapy with steroids has      increased her morning CBGs.  She is being initiated on small  dose      of Glucophage at 500 mg daily.  3. HCTZ 25 mg daily.  4. Folic acid 1 tablet twice daily.  5. Niacin 500 mg each night.  6. Aspirin 81 mg daily.   HISTORY OF PRESENT ILLNESS:  For full details please refer to the H&P as  dictated by Dr. Christella Noa; however briefly, Maria Stone is a 55 year old  female with a history of feeling extremely weak over the prior 6 weeks  with episodic dizziness, lightheadedness, and a ringing sensation in her  ears.  She was initially seen by her primary care physician who switched  her blood pressure medication from Prinivil to Benicar.  These were  stopped and symptoms persisted which were essentially dyspnea on mild  exertion and also occasional palpitations.  She had some chronic  bilateral lower  leg swelling for which she uses a diuretic.  She denied  any bleeding, no hematuria, no melena.  She was admitted for further  workup.  In the emergency room, she was noted to have a profoundly low  hemoglobin/hematocrit.  Hematocrit was 17.6.  She was transferred to  telemetry and a transfusion was ordered.  In the type and cross, it was determined that she had evidence of  hemolysis with a low heptoglobin and elevated LDH.  Hematology was  consulted.  She underwent a battery of testing and it was determined  that she had a warm antibody.  BNP and total CPK were all within normal  range.  Her initial LDH was 612.  Her hepatic function panel revealed  only a mild elevation of her AST, normal ALT.  Her bilirubin was  elevated as well.  She underwent transfusion and a consultation with hematology.  She  tolerated her transfusion and was initiated on steroid dose and  transitioned to prednisone without event.  She underwent the total of  5  PRBC transfusion.   LABORATORY DATA:  At the time of discharge revealed her reticulocyte  count to be elevated at 11.2.  Her total LDH was 560 which was down from  the previous day.  Sodium was 141, potassium 3.9,  BUN 32, and creatinine  1.16, glucose was 88.  Her CBC  revealed a hemoglobin of 9.1, hematocrit  of 26.5, and platelet count 128, WBC 6.9.  Her ANA was negative and her  direct bili was 0.6.  Hemoglobin A1c as described above.  Lipid profile  as described above.  Ferritin was 518.  B12 533.  TSH 0.927.  Other  indices were supportive of the diagnosis of hemolytic anemia.   A CT scan revealed no acute process in the pelvis.  There was a normal  colon, appendix, and terminal ileum, normal abdominal small bowel  without ascites.  There was a left paraaortic retroperitoneal lymph node  1.6 x 2.3 and there was a more superior node that measures 1.1 to 1.2.  There was no crural adenopathy.  The liver was normal.  The spleen was  mildly enlarged at 9.4 x 13.3 x 13.5.  These will be followed up by her  oncologist/hematologist, Dr. Donnie Coffin and Dr. [name] Chime.  There was mild  splenomegaly and retroperitoneal adenopathy, findings are nonspecific.  In the right clinical setting early lymphoma could have this appearance.  If this is a clinical concern further evaluation with CT __________  may  be more informative.  Again, this is to be followed by her  oncologist/hematologist.  She has been seen in consultation during this  hospital visit.   She is clinically medically stable for discharge to follow up with  oncology and her primary care physician.      Hettie Holstein, D.O.  Electronically Signed     ESS/MEDQ  D:  03/12/2008  T:  03/12/2008  Job:  [id number]   cc:   Pierce Crane, MD  Fax: [phone]   Josiah Lobo  Fax: [phone]

## 2011-08-05 LAB — CROSSMATCH
ABO/RH(D): O POS
DAT, IgG: POSITIVE

## 2011-08-05 LAB — BASIC METABOLIC PANEL
BUN: 16
BUN: 16
BUN: 19
CO2: 26
CO2: 32
Calcium: 8.7
Calcium: 9.1
Calcium: 9.4
Chloride: 103
Creatinine, Ser: 1.02
Creatinine, Ser: 1.14
Creatinine, Ser: 1.22 — ABNORMAL HIGH
Creatinine, Ser: 1.34 — ABNORMAL HIGH
GFR calc Af Amer: 56 — ABNORMAL LOW
GFR calc non Af Amer: 42 — ABNORMAL LOW
GFR calc non Af Amer: 46 — ABNORMAL LOW
GFR calc non Af Amer: 50 — ABNORMAL LOW
Glucose, Bld: 119 — ABNORMAL HIGH
Glucose, Bld: 88
Sodium: 139
Sodium: 141

## 2011-08-05 LAB — CBC
HCT: 16.4 — ABNORMAL LOW
Hemoglobin: 5.6 — CL
Hemoglobin: 5.8 — CL
Hemoglobin: 7.8 — CL
MCHC: 31.6
MCHC: 35.1
MCHC: 35.2
MCV: 106.1 — ABNORMAL HIGH
MCV: 107.2 — ABNORMAL HIGH
MCV: 117.6 — ABNORMAL HIGH
Platelets: 118 — ABNORMAL LOW
Platelets: 135 — ABNORMAL LOW
Platelets: 139 — ABNORMAL LOW
Platelets: 153
RBC: 2.16 — ABNORMAL LOW
RDW: 18.9 — ABNORMAL HIGH
RDW: 19.8 — ABNORMAL HIGH
RDW: 25.8 — ABNORMAL HIGH
WBC: 11 — ABNORMAL HIGH
WBC: 14.3 — ABNORMAL HIGH

## 2011-08-05 LAB — DIFFERENTIAL
Basophils Absolute: 0
Basophils Absolute: 0
Basophils Absolute: 0
Basophils Absolute: 0.1
Basophils Relative: 0
Basophils Relative: 1
Eosinophils Absolute: 0.3
Eosinophils Absolute: 0.3
Eosinophils Relative: 0
Eosinophils Relative: 0
Lymphocytes Relative: 17
Lymphocytes Relative: 25
Lymphocytes Relative: 27
Lymphs Abs: 2.2
Lymphs Abs: 3
Monocytes Absolute: 0.5
Monocytes Absolute: 0.6
Monocytes Relative: 2 — ABNORMAL LOW
Monocytes Relative: 3
Monocytes Relative: 3
Neutro Abs: 5.4
Neutrophils Relative %: 66
Neutrophils Relative %: 74

## 2011-08-05 LAB — RETICULOCYTES: Retic Count, Absolute: 310.9 — ABNORMAL HIGH

## 2011-08-05 LAB — T-HELPER CELLS (CD4) COUNT (NOT AT ARMC)
CD4 % Helper T Cell: 44
CD4 T Cell Abs: 900

## 2011-08-05 LAB — URINALYSIS, ROUTINE W REFLEX MICROSCOPIC
Hgb urine dipstick: NEGATIVE
Nitrite: NEGATIVE
Specific Gravity, Urine: 1.006
Urobilinogen, UA: 0.2

## 2011-08-05 LAB — BILIRUBIN, DIRECT: Bilirubin, Direct: 0.6 — ABNORMAL HIGH

## 2011-08-05 LAB — COMPREHENSIVE METABOLIC PANEL
ALT: 37 — ABNORMAL HIGH
Calcium: 9.5
Creatinine, Ser: 1.28 — ABNORMAL HIGH
GFR calc Af Amer: 53 — ABNORMAL LOW
GFR calc non Af Amer: 44 — ABNORMAL LOW
Glucose, Bld: 156 — ABNORMAL HIGH
Sodium: 137
Total Protein: 6

## 2011-08-05 LAB — LUPUS ANTICOAGULANT PANEL
Lupus Anticoagulant: NOT DETECTED
dRVVT Incubated 1:1 Mix: 43.3 (ref 36.1–47.0)

## 2011-08-05 LAB — HEPATIC FUNCTION PANEL
ALT: 34
Bilirubin, Direct: 0.7 — ABNORMAL HIGH
Indirect Bilirubin: 4.2 — ABNORMAL HIGH

## 2011-08-05 LAB — TROPONIN I: Troponin I: 0.16 — ABNORMAL HIGH

## 2011-08-05 LAB — ANA
Anti Nuclear Antibody(ANA): NEGATIVE
Anti Nuclear Antibody(ANA): NEGATIVE
Anti Nuclear Antibody(ANA): NEGATIVE

## 2011-08-05 LAB — PATHOLOGIST SMEAR REVIEW

## 2011-08-05 LAB — PROTEIN ELECTROPHORESIS, SERUM
Beta 2: 5.6
Gamma Globulin: 17.5
M-Spike, %: NOT DETECTED

## 2011-08-05 LAB — TRANSFERRIN: Transferrin: 195 — ABNORMAL LOW

## 2011-08-05 LAB — CK TOTAL AND CKMB (NOT AT ARMC): CK, MB: 2.2

## 2011-08-05 LAB — LACTATE DEHYDROGENASE
LDH: 612 — ABNORMAL HIGH
LDH: 649 — ABNORMAL HIGH

## 2011-08-05 LAB — PROTIME-INR: INR: 1.1

## 2011-08-05 LAB — IRON AND TIBC: UIBC: <55

## 2011-08-05 LAB — APTT: aPTT: 25

## 2011-08-05 LAB — FOLATE: Folate: 13.5

## 2011-08-05 LAB — LIPID PANEL
Cholesterol: 160
Total CHOL/HDL Ratio: 7.3

## 2011-08-05 LAB — HEMOGLOBIN A1C
Hgb A1c MFr Bld: 3.6 — ABNORMAL LOW
Mean Plasma Glucose: 51

## 2012-05-20 ENCOUNTER — Inpatient Hospital Stay (HOSPITAL_COMMUNITY)
Admission: EM | Admit: 2012-05-20 | Discharge: 2012-05-28 | DRG: 348 | Disposition: A | Discharge status: Home / Self Care | Payer: 59 | Attending: Internal Medicine | Admitting: Internal Medicine

## 2012-05-20 ENCOUNTER — Encounter (HOSPITAL_COMMUNITY): Payer: Self-pay | Admitting: Emergency Medicine

## 2012-05-20 ENCOUNTER — Emergency Department (HOSPITAL_COMMUNITY): Payer: 59

## 2012-05-20 DIAGNOSIS — K529 Noninfective gastroenteritis and colitis, unspecified: Secondary | ICD-10-CM

## 2012-05-20 DIAGNOSIS — Z6841 Body Mass Index (BMI) 40.0 and over, adult: Secondary | ICD-10-CM | POA: Diagnosis present

## 2012-05-20 DIAGNOSIS — G8929 Other chronic pain: Secondary | ICD-10-CM | POA: Diagnosis present

## 2012-05-20 DIAGNOSIS — L0231 Cutaneous abscess of buttock: Secondary | ICD-10-CM | POA: Diagnosis present

## 2012-05-20 DIAGNOSIS — R269 Unspecified abnormalities of gait and mobility: Secondary | ICD-10-CM | POA: Diagnosis present

## 2012-05-20 DIAGNOSIS — I10 Essential (primary) hypertension: Secondary | ICD-10-CM | POA: Diagnosis present

## 2012-05-20 DIAGNOSIS — L89109 Pressure ulcer of unspecified part of back, unspecified stage: Secondary | ICD-10-CM | POA: Diagnosis present

## 2012-05-20 DIAGNOSIS — L03317 Cellulitis of buttock: Secondary | ICD-10-CM | POA: Diagnosis present

## 2012-05-20 DIAGNOSIS — K611 Rectal abscess: Secondary | ICD-10-CM | POA: Diagnosis present

## 2012-05-20 DIAGNOSIS — I959 Hypotension, unspecified: Secondary | ICD-10-CM | POA: Diagnosis present

## 2012-05-20 DIAGNOSIS — M810 Age-related osteoporosis without current pathological fracture: Secondary | ICD-10-CM | POA: Diagnosis present

## 2012-05-20 DIAGNOSIS — K612 Anorectal abscess: Principal | ICD-10-CM | POA: Diagnosis present

## 2012-05-20 DIAGNOSIS — N179 Acute kidney failure, unspecified: Secondary | ICD-10-CM | POA: Diagnosis present

## 2012-05-20 DIAGNOSIS — L8992 Pressure ulcer of unspecified site, stage 2: Secondary | ICD-10-CM | POA: Diagnosis present

## 2012-05-20 DIAGNOSIS — N390 Urinary tract infection, site not specified: Secondary | ICD-10-CM | POA: Diagnosis present

## 2012-05-20 DIAGNOSIS — R112 Nausea with vomiting, unspecified: Secondary | ICD-10-CM | POA: Diagnosis present

## 2012-05-20 DIAGNOSIS — K6289 Other specified diseases of anus and rectum: Secondary | ICD-10-CM

## 2012-05-20 HISTORY — DX: Pain in right knee: M25.562

## 2012-05-20 HISTORY — DX: Pain in right knee: M25.561

## 2012-05-20 HISTORY — DX: Unspecified asthma, uncomplicated: J45.909

## 2012-05-20 HISTORY — DX: Hereditary hemolytic anemia, unspecified: D58.9

## 2012-05-20 HISTORY — DX: Morbid (severe) obesity due to excess calories: E66.01

## 2012-05-20 HISTORY — DX: Unspecified osteoarthritis, unspecified site: M19.90

## 2012-05-20 LAB — URINALYSIS, ROUTINE W REFLEX MICROSCOPIC
Glucose, UA: NEGATIVE mg/dL
Specific Gravity, Urine: 1.035 — ABNORMAL HIGH (ref 1.005–1.030)
Urobilinogen, UA: 1 mg/dL (ref 0.0–1.0)

## 2012-05-20 LAB — CBC WITH DIFFERENTIAL/PLATELET
Basophils Absolute: 0 10*3/uL (ref 0.0–0.1)
Basophils Relative: 0 % (ref 0–1)
Eosinophils Relative: 0 % (ref 0–5)
HCT: 35.9 % — ABNORMAL LOW (ref 36.0–46.0)
MCHC: 33.1 g/dL (ref 30.0–36.0)
MCV: 94.5 fL (ref 78.0–100.0)
Monocytes Absolute: 1.3 10*3/uL — ABNORMAL HIGH (ref 0.1–1.0)
Neutro Abs: 20.2 10*3/uL — ABNORMAL HIGH (ref 1.7–7.7)
RDW: 12.1 % (ref 11.5–15.5)

## 2012-05-20 LAB — URINE MICROSCOPIC-ADD ON

## 2012-05-20 LAB — COMPREHENSIVE METABOLIC PANEL
AST: 21 U/L (ref 0–37)
Albumin: 3.5 g/dL (ref 3.5–5.2)
Calcium: 10.1 mg/dL (ref 8.4–10.5)
Creatinine, Ser: 1.45 mg/dL — ABNORMAL HIGH (ref 0.50–1.10)

## 2012-05-20 MED ORDER — MORPHINE SULFATE 4 MG/ML IJ SOLN
4.0000 mg | Freq: Once | INTRAMUSCULAR | Status: AC
  Administered 2012-05-20: 4 mg via INTRAVENOUS
  Filled 2012-05-20: qty 1

## 2012-05-20 MED ORDER — ACETAMINOPHEN 325 MG PO TABS
650.0000 mg | ORAL_TABLET | Freq: Once | ORAL | Status: AC
  Administered 2012-05-20: 650 mg via ORAL
  Filled 2012-05-20: qty 2

## 2012-05-20 MED ORDER — ONDANSETRON HCL 4 MG/2ML IJ SOLN
4.0000 mg | Freq: Once | INTRAMUSCULAR | Status: AC
  Administered 2012-05-20: 4 mg via INTRAVENOUS
  Filled 2012-05-20: qty 2

## 2012-05-20 MED ORDER — VANCOMYCIN HCL 1000 MG IV SOLR
2000.0000 mg | INTRAVENOUS | Status: DC
  Filled 2012-05-20: qty 2000

## 2012-05-20 MED ORDER — ALBUTEROL SULFATE (5 MG/ML) 0.5% IN NEBU: 2.5000 mg | INHALATION_SOLUTION | Freq: Four times a day (QID) | RESPIRATORY_TRACT | Status: DC

## 2012-05-20 MED ORDER — SODIUM CHLORIDE 0.9 % IV BOLUS (SEPSIS)
1000.0000 mL | Freq: Once | INTRAVENOUS | Status: AC
  Administered 2012-05-20: 1000 mL via INTRAVENOUS

## 2012-05-20 MED ORDER — VANCOMYCIN HCL IN DEXTROSE 1-5 GM/200ML-% IV SOLN
1000.0000 mg | INTRAVENOUS | Status: DC
  Filled 2012-05-20: qty 200

## 2012-05-20 MED ORDER — VANCOMYCIN HCL IN DEXTROSE 1-5 GM/200ML-% IV SOLN
1000.0000 mg | Freq: Once | INTRAVENOUS | Status: AC
  Administered 2012-05-21: 1000 mg via INTRAVENOUS
  Filled 2012-05-20: qty 200

## 2012-05-20 MED ORDER — VANCOMYCIN HCL IN DEXTROSE 1-5 GM/200ML-% IV SOLN
1000.0000 mg | Freq: Once | INTRAVENOUS | Status: AC
  Administered 2012-05-20: 1000 mg via INTRAVENOUS
  Filled 2012-05-20: qty 200

## 2012-05-20 MED ORDER — SODIUM CHLORIDE 0.9 % IV BOLUS (SEPSIS)
500.0000 mL | Freq: Once | INTRAVENOUS | Status: AC
  Administered 2012-05-20: 500 mL via INTRAVENOUS

## 2012-05-20 NOTE — ED Notes (Signed)
MD at bedside. 

## 2012-05-20 NOTE — ED Notes (Signed)
Patient transported to CT 

## 2012-05-20 NOTE — ED Notes (Signed)
Pt c/o extremely painful hemorrhoids x 3 days.  Pt states she began vomiting this am.  Pt called her PCP who was out of the office so they recommended she come here.

## 2012-05-20 NOTE — ED Provider Notes (Addendum)
History     CSN: [id number]  Arrival date & time 05/20/12  1229   First MD Initiated Contact with Patient 05/20/12 1316      Chief Complaint  Patient presents with  . Emesis  . Rectal Pain  . Fever    (Consider location/radiation/quality/duration/timing/severity/associated sxs/prior treatment) HPI  56yoF h/o hemolytic anemia pw multiple complaints. C/O 3 days of hemorrhoid pain, now resolved after prep H and stool softeners which helped. Min pain at this time.2/10. This morning began to have vomiting and nausea. Unable to tolerate PO except pedialyte and min saltines. Bilious. One episode. +Chills Subj fevers. Denies abdominal pain, back pain. Denies rash. Last BM today was loose stool (after taking softeners). Denies hematuria/dysuria/freq/urgency. Also complaining of headache "at the top of my head" which she rates as 6/10 which began after the nausea this morning. She does not have h/o freq headaches. Did not take anything for pain at home. Denies SOB/CP/coughing  ED Notes, ED Provider Notes from 05/20/12 0000 to 05/20/12 13:08:07       Bridgette Casimer Leek, RN 05/20/2012 13:03      Pt c/o extremely painful hemorrhoids x 3 days. Pt states she began vomiting this am. Pt called her PCP who was out of the office so they recommended she come here.    Past Medical History  Diagnosis Date  . Asthma   . Hemolytic anemia     History reviewed. No pertinent past surgical history.  History reviewed. No pertinent family history.  History  Substance Use Topics  . Smoking status: Former Games developer  . Smokeless tobacco: Not on file  . Alcohol Use: Yes     occasionally    OB History    Grav Para Term Preterm Abortions TAB SAB Ect Mult Living                  Review of Systems  All other systems reviewed and are negative.  except as noted HPI   Allergies  Nsaids; Contrast media; Penicillins; and Codeine  Home Medications   Current Outpatient Rx  Name Route Sig Dispense Refill   . ACETAMINOPHEN 500 MG PO TABS Oral Take 1,000 mg by mouth every 6 (six) hours as needed. For pain    . ALENDRONATE SODIUM 70 MG PO TABS Oral Take 70 mg by mouth every Wednesday. Take with a full glass of water on an empty stomach.    Marland Kitchen VITAMIN D 1000 UNITS PO TABS Oral Take 1,000 Units by mouth daily.    Marland Kitchen FOLIC ACID 1 MG PO TABS Oral Take 1 mg by mouth daily.    . FUROSEMIDE 40 MG PO TABS Oral Take 40 mg by mouth daily as needed. Water retention    . INOSITOL NIACINATE 500 MG PO CAPS Oral Take 1 capsule by mouth daily.    Marland Kitchen LISINOPRIL 10 MG PO TABS Oral Take 10 mg by mouth daily.    Lanetta Inch BI-FLEX JOINT SHIELD PO TABS Oral Take 1 tablet by mouth daily.    . ADULT MULTIVITAMIN W/MINERALS CH Oral Take 1 tablet by mouth daily.    Marland Kitchen SPIRONOLACTONE-HCTZ 25-25 MG PO TABS Oral Take 1 tablet by mouth daily.    . TRAMADOL HCL 50 MG PO TABS Oral Take 50 mg by mouth daily as needed. For pain      BP 104/47  Pulse 113  Temp 101.7 F (38.7 C) (Oral)  Resp 18  Ht 5\' 1"  (1.549 m)  Wt 385 lb (174.635  kg)  BMI 72.75 kg/m2  SpO2 98%  Physical Exam  Nursing note and vitals reviewed. Constitutional: She is oriented to person, place, and time. She appears well-developed.  HENT:  Head: Atraumatic.  Mouth/Throat: Oropharynx is clear and moist.  Eyes: Conjunctivae and EOM are normal. Pupils are equal, round, and reactive to light.  Neck: Normal range of motion. Neck supple.       No meningismus  Cardiovascular: Normal rate, regular rhythm, normal heart sounds and intact distal pulses.   Pulmonary/Chest: Effort normal and breath sounds normal. No respiratory distress. She has no wheezes. She has no rales.  Abdominal: Soft. She exhibits no distension. There is no tenderness. There is no rebound and no guarding.       Obese No cvat  Genitourinary:       Difficult to fully visualize rectum 2/2 body habitus. +mild erythema gluteal folds +Rectal pain with digital exam  Musculoskeletal: Normal range of  motion.  Neurological: She is alert and oriented to person, place, and time. No cranial nerve deficit. She exhibits normal muscle tone. Coordination normal.       Strength 5/5 all extremities No pronator drift No facial droop   Skin: Skin is warm and dry. Rash noted.  Psychiatric: She has a normal mood and affect.    Date: 05/20/2012  Rate: 103  Rhythm: sinus tachycardia  QRS Axis: normal  Intervals: normal  ST/T Wave abnormalities: normal  Conduction Disutrbances:none  Narrative Interpretation:   Old EKG Reviewed: no sig change from previous  ED Course  Procedures (including critical care time)  Labs Reviewed  CBC WITH DIFFERENTIAL - Abnormal; Notable for the following:    WBC 22.5 (*)     RBC 3.80 (*)     Hemoglobin 11.9 (*)     HCT 35.9 (*)     Neutrophils Relative 89 (*)     Neutro Abs 20.2 (*)     Lymphocytes Relative 5 (*)     Monocytes Absolute 1.3 (*)     All other components within normal limits  COMPREHENSIVE METABOLIC PANEL - Abnormal; Notable for the following:    Sodium 134 (*)     Chloride 94 (*)     Glucose, Bld 126 (*)     Creatinine, Ser 1.45 (*)     GFR calc non Af Amer 39 (*)     GFR calc Af Amer 45 (*)     All other components within normal limits  URINALYSIS, ROUTINE W REFLEX MICROSCOPIC   No results found.  No diagnosis found.  MDM  Fever, rectal pain, vomiting. Leukocytosis 22K. CR 1.45. IVF bolus given. Labs pending including U/A. CT pelvis eval rectal abscess. IVF, morphine, zofran ordered. Reassess.      Forbes Cellar, MD 05/20/12 1610  Forbes Cellar, MD 05/20/12 1515  Forbes Cellar, MD 05/20/12 [id number]  Forbes Cellar, MD 05/20/12 [phone]

## 2012-05-21 ENCOUNTER — Encounter (HOSPITAL_COMMUNITY): Payer: Self-pay | Admitting: Emergency Medicine

## 2012-05-21 ENCOUNTER — Inpatient Hospital Stay (HOSPITAL_COMMUNITY): Payer: 59

## 2012-05-21 DIAGNOSIS — J45909 Unspecified asthma, uncomplicated: Secondary | ICD-10-CM | POA: Insufficient documentation

## 2012-05-21 DIAGNOSIS — I10 Essential (primary) hypertension: Secondary | ICD-10-CM | POA: Diagnosis present

## 2012-05-21 DIAGNOSIS — I959 Hypotension, unspecified: Secondary | ICD-10-CM | POA: Diagnosis present

## 2012-05-21 DIAGNOSIS — R112 Nausea with vomiting, unspecified: Secondary | ICD-10-CM | POA: Diagnosis present

## 2012-05-21 DIAGNOSIS — I872 Venous insufficiency (chronic) (peripheral): Secondary | ICD-10-CM | POA: Insufficient documentation

## 2012-05-21 DIAGNOSIS — E785 Hyperlipidemia, unspecified: Secondary | ICD-10-CM | POA: Insufficient documentation

## 2012-05-21 DIAGNOSIS — M810 Age-related osteoporosis without current pathological fracture: Secondary | ICD-10-CM | POA: Insufficient documentation

## 2012-05-21 DIAGNOSIS — N390 Urinary tract infection, site not specified: Secondary | ICD-10-CM | POA: Diagnosis present

## 2012-05-21 DIAGNOSIS — M170 Bilateral primary osteoarthritis of knee: Secondary | ICD-10-CM | POA: Insufficient documentation

## 2012-05-21 DIAGNOSIS — K611 Rectal abscess: Secondary | ICD-10-CM | POA: Diagnosis present

## 2012-05-21 DIAGNOSIS — D589 Hereditary hemolytic anemia, unspecified: Secondary | ICD-10-CM | POA: Insufficient documentation

## 2012-05-21 LAB — CREATININE, SERUM
Creatinine, Ser: 2.52 mg/dL — ABNORMAL HIGH (ref 0.50–1.10)
GFR calc Af Amer: 23 mL/min — ABNORMAL LOW (ref >90)

## 2012-05-21 LAB — CBC
MCV: 93.8 fL (ref 78.0–100.0)
Platelets: 149 10*3/uL — ABNORMAL LOW (ref 150–400)
RBC: 3.41 MIL/uL — ABNORMAL LOW (ref 3.87–5.11)
RDW: 12.4 % (ref 11.5–15.5)
WBC: 21.6 10*3/uL — ABNORMAL HIGH (ref 4.0–10.5)

## 2012-05-21 MED ORDER — ONDANSETRON HCL 4 MG/2ML IJ SOLN
4.0000 mg | Freq: Four times a day (QID) | INTRAMUSCULAR | Status: DC | PRN
  Administered 2012-05-21 – 2012-05-24 (×4): 4 mg via INTRAVENOUS
  Filled 2012-05-21 (×2): qty 2

## 2012-05-21 MED ORDER — ENOXAPARIN SODIUM 40 MG/0.4ML ~~LOC~~ SOLN
40.0000 mg | SUBCUTANEOUS | Status: DC
  Administered 2012-05-21 – 2012-05-24 (×4): 40 mg via SUBCUTANEOUS
  Filled 2012-05-21 (×5): qty 0.4

## 2012-05-21 MED ORDER — POTASSIUM CHLORIDE IN NACL 20-0.9 MEQ/L-% IV SOLN: INTRAVENOUS | Status: DC

## 2012-05-21 MED ORDER — DEXTROSE 5 % IV SOLN
1.0000 g | Freq: Once | INTRAVENOUS | Status: AC
  Administered 2012-05-21: 1 g via INTRAVENOUS
  Filled 2012-05-21: qty 10

## 2012-05-21 MED ORDER — FOLIC ACID 1 MG PO TABS
1.0000 mg | ORAL_TABLET | Freq: Every day | ORAL | Status: DC
  Administered 2012-05-21 – 2012-05-28 (×8): 1 mg via ORAL
  Filled 2012-05-21 (×8): qty 1

## 2012-05-21 MED ORDER — VANCOMYCIN HCL 1000 MG IV SOLR
1500.0000 mg | Freq: Two times a day (BID) | INTRAVENOUS | Status: DC
  Administered 2012-05-21: 1500 mg via INTRAVENOUS
  Filled 2012-05-21 (×2): qty 1500

## 2012-05-21 MED ORDER — ZOLPIDEM TARTRATE 5 MG PO TABS
5.0000 mg | ORAL_TABLET | Freq: Every evening | ORAL | Status: DC | PRN
  Administered 2012-05-25: 5 mg via ORAL
  Filled 2012-05-21 (×2): qty 1

## 2012-05-21 MED ORDER — ACETAMINOPHEN 325 MG PO TABS: 650.0000 mg | ORAL_TABLET | Freq: Four times a day (QID) | ORAL | Status: DC | PRN

## 2012-05-21 MED ORDER — POTASSIUM CHLORIDE IN NACL 20-0.9 MEQ/L-% IV SOLN
INTRAVENOUS | Status: DC
  Administered 2012-05-21 – 2012-05-22 (×2): via INTRAVENOUS
  Administered 2012-05-22: 1000 mL via INTRAVENOUS
  Administered 2012-05-23 – 2012-05-28 (×6): via INTRAVENOUS
  Filled 2012-05-21 (×13): qty 1000

## 2012-05-21 MED ORDER — PANTOPRAZOLE SODIUM 40 MG IV SOLR: 40.0000 mg | Freq: Once | INTRAVENOUS | Status: AC

## 2012-05-21 MED ORDER — ONDANSETRON HCL 4 MG PO TABS
4.0000 mg | ORAL_TABLET | Freq: Four times a day (QID) | ORAL | Status: DC | PRN
  Administered 2012-05-23: 4 mg via ORAL
  Filled 2012-05-21: qty 1

## 2012-05-21 MED ORDER — BISACODYL 5 MG PO TBEC: 5.0000 mg | DELAYED_RELEASE_TABLET | Freq: Every day | ORAL | Status: DC | PRN

## 2012-05-21 MED ORDER — ADULT MULTIVITAMIN W/MINERALS CH
1.0000 | ORAL_TABLET | Freq: Every day | ORAL | Status: DC
  Administered 2012-05-21 – 2012-05-28 (×8): 1 via ORAL
  Filled 2012-05-21 (×8): qty 1

## 2012-05-21 MED ORDER — ONDANSETRON HCL 4 MG/2ML IJ SOLN
4.0000 mg | Freq: Once | INTRAMUSCULAR | Status: AC
  Administered 2012-05-21: 4 mg via INTRAVENOUS
  Filled 2012-05-21: qty 2

## 2012-05-21 MED ORDER — ONDANSETRON HCL 4 MG/2ML IJ SOLN
4.0000 mg | Freq: Once | INTRAMUSCULAR | Status: DC
  Filled 2012-05-21 (×4): qty 2

## 2012-05-21 MED ORDER — DEXTROSE 5 % IV SOLN
1.0000 g | Freq: Every day | INTRAVENOUS | Status: DC
  Administered 2012-05-21 – 2012-05-27 (×7): 1 g via INTRAVENOUS
  Filled 2012-05-21 (×7): qty 10

## 2012-05-21 MED ORDER — ACETAMINOPHEN 650 MG RE SUPP: 650.0000 mg | Freq: Four times a day (QID) | RECTAL | Status: DC | PRN

## 2012-05-21 MED ORDER — ALUM & MAG HYDROXIDE-SIMETH 200-200-20 MG/5ML PO SUSP: 30.0000 mL | Freq: Four times a day (QID) | ORAL | Status: DC | PRN

## 2012-05-21 MED ORDER — ACETAMINOPHEN 500 MG PO TABS
1000.0000 mg | ORAL_TABLET | Freq: Four times a day (QID) | ORAL | Status: DC | PRN
  Administered 2012-05-21 – 2012-05-24 (×2): 1000 mg via ORAL
  Filled 2012-05-21 (×3): qty 2

## 2012-05-21 MED ORDER — CIPROFLOXACIN IN D5W 400 MG/200ML IV SOLN
400.0000 mg | Freq: Two times a day (BID) | INTRAVENOUS | Status: DC
  Administered 2012-05-21 – 2012-05-28 (×15): 400 mg via INTRAVENOUS
  Filled 2012-05-21 (×15): qty 200

## 2012-05-21 MED ORDER — VITAMIN D3 25 MCG (1000 UNIT) PO TABS
1000.0000 [IU] | ORAL_TABLET | Freq: Every day | ORAL | Status: DC
  Administered 2012-05-21 – 2012-05-28 (×8): 1000 [IU] via ORAL
  Filled 2012-05-21 (×8): qty 1

## 2012-05-21 MED ORDER — METHYLPREDNISOLONE SODIUM SUCC 125 MG IJ SOLR
125.0000 mg | Freq: Once | INTRAMUSCULAR | Status: AC
  Administered 2012-05-21: 125 mg via INTRAVENOUS
  Filled 2012-05-21: qty 2

## 2012-05-21 MED ORDER — TRAMADOL HCL 50 MG PO TABS
50.0000 mg | ORAL_TABLET | Freq: Four times a day (QID) | ORAL | Status: DC | PRN
  Administered 2012-05-21 – 2012-05-22 (×4): 50 mg via ORAL
  Filled 2012-05-21 (×4): qty 1

## 2012-05-21 MED ORDER — SODIUM CHLORIDE 0.9 % IV SOLN
INTRAVENOUS | Status: AC
  Administered 2012-05-21: 04:00:00 via INTRAVENOUS

## 2012-05-21 MED ORDER — PANTOPRAZOLE SODIUM 40 MG IV SOLR
40.0000 mg | Freq: Once | INTRAVENOUS | Status: AC
  Administered 2012-05-21: 40 mg via INTRAVENOUS
  Filled 2012-05-21: qty 40

## 2012-05-21 NOTE — ED Provider Notes (Signed)
History     CSN: [id number]  Arrival date & time 05/20/12  1229   First MD Initiated Contact with Patient 05/20/12 1316      Chief Complaint  Patient presents with  . Emesis  . Rectal Pain  . Fever    (Consider location/radiation/quality/duration/timing/severity/associated sxs/prior treatment) HPI  Past Medical History  Diagnosis Date  . Asthma   . Hemolytic anemia     History reviewed. No pertinent past surgical history.  History reviewed. No pertinent family history.  History  Substance Use Topics  . Smoking status: Former Games developer  . Smokeless tobacco: Not on file  . Alcohol Use: Yes     occasionally    OB History    Grav Para Term Preterm Abortions TAB SAB Ect Mult Living                  Review of Systems  Allergies  Nsaids; Contrast media; Penicillins; and Codeine  Home Medications   Current Outpatient Rx  Name Route Sig Dispense Refill  . ACETAMINOPHEN 500 MG PO TABS Oral Take 1,000 mg by mouth every 6 (six) hours as needed. For pain    . ALENDRONATE SODIUM 70 MG PO TABS Oral Take 70 mg by mouth every Wednesday. Take with a full glass of water on an empty stomach.    Marland Kitchen VITAMIN D 1000 UNITS PO TABS Oral Take 1,000 Units by mouth daily.    Marland Kitchen FOLIC ACID 1 MG PO TABS Oral Take 1 mg by mouth daily.    . FUROSEMIDE 40 MG PO TABS Oral Take 40 mg by mouth daily as needed. Water retention    . INOSITOL NIACINATE 500 MG PO CAPS Oral Take 1 capsule by mouth daily.    Marland Kitchen LISINOPRIL 10 MG PO TABS Oral Take 10 mg by mouth daily.    Lanetta Inch BI-FLEX JOINT SHIELD PO TABS Oral Take 1 tablet by mouth daily.    . ADULT MULTIVITAMIN W/MINERALS CH Oral Take 1 tablet by mouth daily.    Marland Kitchen SPIRONOLACTONE-HCTZ 25-25 MG PO TABS Oral Take 1 tablet by mouth daily.    . TRAMADOL HCL 50 MG PO TABS Oral Take 50 mg by mouth daily as needed. For pain      BP 112/48  Pulse 93  Temp 102.7 F (39.3 C) (Oral)  Resp 16  Ht 5\' 1"  (1.549 m)  Wt 385 lb (174.635 kg)  BMI 72.75 kg/m2   SpO2 93%  Physical Exam  ED Course  Procedures (including critical care time)  Labs Reviewed  CBC WITH DIFFERENTIAL - Abnormal; Notable for the following:    WBC 22.5 (*)     RBC 3.80 (*)     Hemoglobin 11.9 (*)     HCT 35.9 (*)     Neutrophils Relative 89 (*)     Neutro Abs 20.2 (*)     Lymphocytes Relative 5 (*)     Monocytes Absolute 1.3 (*)     All other components within normal limits  COMPREHENSIVE METABOLIC PANEL - Abnormal; Notable for the following:    Sodium 134 (*)     Chloride 94 (*)     Glucose, Bld 126 (*)     Creatinine, Ser 1.45 (*)     GFR calc non Af Amer 39 (*)     GFR calc Af Amer 45 (*)     All other components within normal limits  URINALYSIS, ROUTINE W REFLEX MICROSCOPIC - Abnormal; Notable for the following:  Color, Urine AMBER (*)  BIOCHEMICALS MAY BE AFFECTED BY COLOR   APPearance TURBID (*)     Specific Gravity, Urine 1.035 (*)     Hgb urine dipstick MODERATE (*)     Bilirubin Urine MODERATE (*)     Ketones, ur TRACE (*)     Protein, ur 100 (*)     Leukocytes, UA LARGE (*)     All other components within normal limits  URINE MICROSCOPIC-ADD ON - Abnormal; Notable for the following:    Squamous Epithelial / LPF MANY (*)     Bacteria, UA MANY (*)     Casts GRANULAR CAST (*)     All other components within normal limits   Ct Pelvis Wo Contrast  05/20/2012  *RADIOLOGY REPORT*  Clinical Data:  Rectal abscess  CT PELVIS WITHOUT CONTRAST  Technique:  Multidetector CT imaging of the pelvis was performed following the standard protocol without intravenous contrast.  Comparison:  03/07/2008  Findings:  Noncontrast images of the pelvis demonstrate abnormal stranding extending from the left ischiorectal fossa region inferiorly into the subcutaneous tissues, with a small amount of gas density in the soft tissues in the left lower perianal region which could represent a perianal fistula.  A well-defined fluid density abscess is not identified, although IV  contrast could not be provided due to a dye allergy.  Inflammatory stranding extends in the subcutaneous tissues of the left buttock.  The appendix appears normal.  There is an abnormal appearance of the cecum which is thick-walled.  The terminal ileum appears unremarkable.  Sigmoid diverticulosis noted without active diverticulitis.  Small bilateral inguinal lymph nodes are present.  IMPRESSION:  1.  Abnormal inflammatory stranding in the left ischiorectal fossa and perianal region, with a small amount of gas within the soft tissue density in the left perianal subcutaneous region, conceivably representing a perianal fistula or a small abscess.  A well-defined fluid density abscess is not observed, although IV contrast cannot be given due to a contrast allergy.  If clinically warranted, MRI using perianal fistula protocol with and without contrast could provide considerable regional detail. 2.  Suspected wall thickening in the cecum, without abnormality of the appendix or terminal ileum.  Inflammation involving the cecum is not excluded. 3.  Sigmoid diverticulosis without active diverticulitis  Original Report Authenticated By: Dellia Cloud, M.D.     1. Gastroenteritis   2. Urinary tract infection   3. Rectal inflammation       MDM  Patient is morbidly obese. She is dehydrated. Laboratory evaluation shows a urinary tract infection. CT scan shows a possible early perianal abscess. IV Rocephin and vancomycin given. Admit        Donnetta Hutching, MD 05/21/12 [phone]

## 2012-05-21 NOTE — H&P (Signed)
Maria Stone is an 56 y.o. female.   Chief Complaint: rectal pain HPI:  The patient is a 56 year old woman who for the past few days has had worsening symptoms of rectal pain, decreased appetite, nausea, and vomiting. Today she had some fever along with nausea and vomiting, so she presented to the emergency room for evaluation. She had been using Anusol HC for possible hemorrhoids without improvement in her symptoms. She denied problems with shortness of breath, cough, chest pain, abdominal pain, dysuria, or frequency. She has several medical problems including morbid obesity, hypertension, chronic venous insufficiency, a history of hemolytic anemia treated with prednisone until about 3 years ago, asthma, and hyperlipidemia. Emergency room workup was most significant for a CT scan of the abdomen and pelvis that showed some inflammatory change in the left perirectal region with possible gas consistent with abscess or fistula. There was no evidence of diverticulitis or other site of infection. Labs in the emergency room was significant for bacteriuria and moderate leukocytosis with a white blood cell count greater than 20,000. She initially felt poorly in the emergency room with nausea and vomiting. This has subsided and she no longer has significant nausea, but does have the feeling that she would like to have a bowel movement. She continues to have some left-sided rectal pain.  Past Medical History  Diagnosis Date  . Asthma   . Hemolytic anemia   . Morbid obesity   . Knee pain, bilateral   . Arthritis     Medications Prior to Admission  Medication Sig Dispense Refill  . acetaminophen (TYLENOL) 500 MG tablet Take 1,000 mg by mouth every 6 (six) hours as needed. For pain      . alendronate (FOSAMAX) 70 MG tablet Take 70 mg by mouth every Wednesday. Take with a full glass of water on an empty stomach.      . cholecalciferol (VITAMIN D) 1000 UNITS tablet Take 1,000 Units by mouth daily.      .  folic acid (FOLVITE) 1 MG tablet Take 1 mg by mouth daily.      . furosemide (LASIX) 40 MG tablet Take 40 mg by mouth daily as needed. Water retention      . Inositol Niacinate (NIACIN FLUSH FREE) 500 MG CAPS Take 1 capsule by mouth daily.      Marland Kitchen lisinopril (PRINIVIL,ZESTRIL) 10 MG tablet Take 10 mg by mouth daily.      . Misc Natural Products (OSTEO BI-FLEX JOINT SHIELD) TABS Take 1 tablet by mouth daily.      . Multiple Vitamin (MULTIVITAMIN WITH MINERALS) TABS Take 1 tablet by mouth daily.      Marland Kitchen spironolactone-hydrochlorothiazide (ALDACTAZIDE) 25-25 MG per tablet Take 1 tablet by mouth daily.      . traMADol (ULTRAM) 50 MG tablet Take 50 mg by mouth daily as needed. For pain        ADDITIONAL HOME MEDICATIONS: Folate 1 mg daily, Lasix 40 mg daily, multivitamin once daily, niacin 500 mg daily, Osteo Bi-Flex 2 tablets daily, Proventil HFA as needed, Aldactazide 25/25 once daily, vitamin D 400 units daily, lisinopril 10 mg daily, tramadol 50 mg by mouth daily when necessary moderate pain, Fosamax 70 mg by mouth once weekly, Lidoderm 5% patch daily  PHYSICIANS INVOLVED IN CARE: Creola Corn, Lacretia Nicks, Dr. Seymour Bars  History reviewed. No pertinent past surgical history. 1980 she had a leg fracture treated with casting, 2006 she had a foot fracture treated with casting  History reviewed. No  pertinent family history. her father died of complications of diabetes mellitus, type II and obesity. Her mother is age 40 years and in good health. She has one brother with diabetes mellitus, type II and one sister with diabetes mellitus, type II.   Social History:  reports that she has quit smoking. She does not have any smokeless tobacco history on file. She reports that she drinks alcohol. Her drug history not on file. she is divorced and has one daughter born in April 1977. 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 She has a history of tobacco abuse stopped in 1977 and no history of alcohol  abuse.  Allergies:  Allergies  Allergen Reactions  . Nsaids Anaphylaxis  . Contrast Media (Iodinated Diagnostic Agents) Swelling  . Penicillins Other (See Comments)    Childhood allergy  . Codeine Rash     ROS: anemia, ankle swelling, arthritis, asthma and high blood pressure  PHYSICAL EXAM: Blood pressure 80/50, pulse 92, temperature 99.2 F (37.3 C), temperature source Oral, resp. rate 20, height 5\' 1"  (1.549 m), weight 173.4 kg (382 lb 4.4 oz), SpO2 96.00%. In general, the patient is a pleasant Caucasian woman with morbid obesity who was in no apparent distress while lying supine. HEENT exam was within normal limits, neck was supple and without jugular venous distention or carotid bruit, chest was clear to auscultation, heart had a regular rate and rhythm without significant murmur or gallop, abdomen had normal bowel sounds and no hepatosplenomegaly or tenderness, extremities have bilateral 1+ edema with bilateral normal pedal pulses. There was mild left perirectal tenderness without evidence of fluctuant mass or hemorrhoids. Neurologic exam: She was alert and well oriented with normal affect, cranial nerves II through XII are normal, she was able to move all extremities well.  Results for orders placed during the hospital encounter of 05/20/12 (from the past 48 hour(s))  CBC WITH DIFFERENTIAL     Status: Abnormal   Collection Time   05/20/12  1:48 PM      Component Value Range Comment   WBC 22.5 (*) 4.0 - 10.5 K/uL    RBC 3.80 (*) 3.87 - 5.11 MIL/uL    Hemoglobin 11.9 (*) 12.0 - 15.0 Stone/dL    HCT 16.1 (*) 09.6 - 46.0 %    MCV 94.5  78.0 - 100.0 fL    MCH 31.3  26.0 - 34.0 pg    MCHC 33.1  30.0 - 36.0 Stone/dL    RDW 04.5  40.9 - 81.1 %    Platelets 179  150 - 400 K/uL    Neutrophils Relative 89 (*) 43 - 77 %    Neutro Abs 20.2 (*) 1.7 - 7.7 K/uL    Lymphocytes Relative 5 (*) 12 - 46 %    Lymphs Abs 1.1  0.7 - 4.0 K/uL    Monocytes Relative 6  3 - 12 %    Monocytes Absolute 1.3  (*) 0.1 - 1.0 K/uL    Eosinophils Relative 0  0 - 5 %    Eosinophils Absolute 0.0  0.0 - 0.7 K/uL    Basophils Relative 0  0 - 1 %    Basophils Absolute 0.0  0.0 - 0.1 K/uL   COMPREHENSIVE METABOLIC PANEL     Status: Abnormal   Collection Time   05/20/12  1:48 PM      Component Value Range Comment   Sodium 134 (*) 135 - 145 mEq/L    Potassium 3.8  3.5 - 5.1 mEq/L  Chloride 94 (*) 96 - 112 mEq/L    CO2 28  19 - 32 mEq/L    Glucose, Bld 126 (*) 70 - 99 mg/dL    BUN 19  6 - 23 mg/dL    Creatinine, Ser 1.61 (*) 0.50 - 1.10 mg/dL    Calcium 09.6  8.4 - 10.5 mg/dL    Total Protein 7.1  6.0 - 8.3 Stone/dL    Albumin 3.5  3.5 - 5.2 Stone/dL    AST 21  0 - 37 U/L    ALT 25  0 - 35 U/L    Alkaline Phosphatase 82  39 - 117 U/L    Total Bilirubin 0.8  0.3 - 1.2 mg/dL    GFR calc non Af Amer 39 (*) >90 mL/min    GFR calc Af Amer 45 (*) >90 mL/min   URINALYSIS, ROUTINE W REFLEX MICROSCOPIC     Status: Abnormal   Collection Time   05/20/12  4:15 PM      Component Value Range Comment   Color, Urine AMBER (*) YELLOW BIOCHEMICALS MAY BE AFFECTED BY COLOR   APPearance TURBID (*) CLEAR    Specific Gravity, Urine 1.035 (*) 1.005 - 1.030    pH 5.5  5.0 - 8.0    Glucose, UA NEGATIVE  NEGATIVE mg/dL    Hgb urine dipstick MODERATE (*) NEGATIVE    Bilirubin Urine MODERATE (*) NEGATIVE    Ketones, ur TRACE (*) NEGATIVE mg/dL    Protein, ur 045 (*) NEGATIVE mg/dL    Urobilinogen, UA 1.0  0.0 - 1.0 mg/dL    Nitrite NEGATIVE  NEGATIVE    Leukocytes, UA LARGE (*) NEGATIVE   URINE MICROSCOPIC-ADD ON     Status: Abnormal   Collection Time   05/20/12  4:15 PM      Component Value Range Comment   Squamous Epithelial / LPF MANY (*) RARE    WBC, UA 21-50  <3 WBC/hpf    RBC / HPF 11-20  <3 RBC/hpf    Bacteria, UA MANY (*) RARE    Casts GRANULAR CAST (*) NEGATIVE    Ct Pelvis Wo Contrast  05/20/2012  *RADIOLOGY REPORT*  Clinical Data:  Rectal abscess  CT PELVIS WITHOUT CONTRAST  Technique:  Multidetector CT  imaging of the pelvis was performed following the standard protocol without intravenous contrast.  Comparison:  03/07/2008  Findings:  Noncontrast images of the pelvis demonstrate abnormal stranding extending from the left ischiorectal fossa region inferiorly into the subcutaneous tissues, with a small amount of gas density in the soft tissues in the left lower perianal region which could represent a perianal fistula.  A well-defined fluid density abscess is not identified, although IV contrast could not be provided due to a dye allergy.  Inflammatory stranding extends in the subcutaneous tissues of the left buttock.  The appendix appears normal.  There is an abnormal appearance of the cecum which is thick-walled.  The terminal ileum appears unremarkable.  Sigmoid diverticulosis noted without active diverticulitis.  Small bilateral inguinal lymph nodes are present.  IMPRESSION:  1.  Abnormal inflammatory stranding in the left ischiorectal fossa and perianal region, with a small amount of gas within the soft tissue density in the left perianal subcutaneous region, conceivably representing a perianal fistula or a small abscess.  A well-defined fluid density abscess is not observed, although IV contrast cannot be given due to a contrast allergy.  If clinically warranted, MRI using perianal fistula protocol with and without contrast could provide  considerable regional detail. 2.  Suspected wall thickening in the cecum, without abnormality of the appendix or terminal ileum.  Inflammation involving the cecum is not excluded. 3.  Sigmoid diverticulosis without active diverticulitis  Original Report Authenticated By: Dellia Cloud, M.D.     Assessment/Plan #1 Fever with nausea and vomiting: This most likely from a perirectal abscess, and less likely from a urinary tract infection given her presenting symptoms. She is on broad-spectrum antibiotics with vancomycin, Rocephin, and ciprofloxacin given her septic  physiology. We will obtain a MRI scan of the rectal area to see if she truly has an abscess or fistula. #2 Hypotension: This most likely from intravascular volume depletion, but could be from pain medication given to her in The emergency room, and less likely from relative adrenal insufficiency from previous high-dose prednisone treatment. Accordingly she is receiving high dose IV fluids for now and one dose of Solu-Medrol. Her usual medications for hypertension are being held. #3 Acute Renal Insufficiency: She has had a mild increase in her serum creatinine level that is most likely from hypotension with renal hypoperfusion. Accordingly her lisinopril will be held and we will follow her renal function closely.   Maria Stone 05/21/2012, 4:41 AM

## 2012-05-21 NOTE — Progress Notes (Addendum)
CRITICAL VALUE ALERT  Critical value received:  BP 70/40  Date of notification:  05/21/12  Time of notification:  0320  Critical value read back:no  Nurse who received alert:  Oneita Jolly, RN   MD notified (1st page):  Dr. Eloise Harman  Time of first page:  0320  MD notified (2nd page):  Time of second page:  Responding MD: Dr. Eloise Harman, New orders received and initiated.     Time MD responded:[id number]

## 2012-05-21 NOTE — Progress Notes (Signed)
ANTIBIOTIC CONSULT NOTE - Follow-up  Pharmacy Consult for vancomycin Indication: abscess  Allergies  Allergen Reactions  . Nsaids Anaphylaxis  . Contrast Media (Iodinated Diagnostic Agents) Swelling  . Penicillins Other (See Comments)    Childhood allergy  . Codeine Rash    Patient Measurements: Height: 5\' 1"  (154.9 cm) Weight: 382 lb 4.4 oz (173.4 kg) IBW/kg (Calculated) : 47.8  Adjusted Body Weight:   Vital Signs: Temp: 98.8 F (37.1 C) (07/12 1441) Temp src: Oral (07/12 1441) BP: 118/74 mmHg (07/12 1441) Pulse Rate: 84  (07/12 1441) Intake/Output from previous day: 07/11 0701 - 07/12 0700 In: 2000 [I.V.:2000] Out: -  Intake/Output from this shift:    Labs:  Basename 05/21/12 0415 05/20/12 1348  WBC 21.6* 22.5*  HGB 10.6* 11.9*  PLT 149* 179  LABCREA -- --  CREATININE 2.52* 1.45*   Estimated Creatinine Clearance: 38.1 ml/min (by C-G formula based on Cr of 2.52). No results found for this basename: VANCOTROUGH:2,VANCOPEAK:2,VANCORANDOM:2,GENTTROUGH:2,GENTPEAK:2,GENTRANDOM:2,TOBRATROUGH:2,TOBRAPEAK:2,TOBRARND:2,AMIKACINPEAK:2,AMIKACINTROU:2,AMIKACIN:2, in the last 72 hours   Microbiology: No results found for this or any previous visit (from the past 720 hour(s)).  Medical History: Past Medical History  Diagnosis Date  . Asthma   . Hemolytic anemia   . Morbid obesity   . Knee pain, bilateral   . Arthritis     Medications:  Anti-infectives     Start     Dose/Rate Route Frequency Ordered Stop   05/21/12 2200   cefTRIAXone (ROCEPHIN) 1 g in dextrose 5 % 50 mL IVPB        1 g 100 mL/hr over 30 Minutes Intravenous Daily at bedtime 05/21/12 0327     05/21/12 1200   vancomycin (VANCOCIN) 1,500 mg in sodium chloride 0.9 % 500 mL IVPB  Status:  Discontinued        1,500 mg 250 mL/hr over 120 Minutes Intravenous Every 12 hours 05/21/12 0402 05/21/12 1516   05/21/12 0430   ciprofloxacin (CIPRO) IVPB 400 mg        400 mg 200 mL/hr over 60 Minutes  Intravenous 2 times daily 05/21/12 0401     05/21/12 0045   cefTRIAXone (ROCEPHIN) 1 g in dextrose 5 % 50 mL IVPB        1 g 100 mL/hr over 30 Minutes Intravenous  Once 05/21/12 0032 05/21/12 0244   05/20/12 2300   vancomycin (VANCOCIN) IVPB 1000 mg/200 mL premix        1,000 mg 200 mL/hr over 60 Minutes Intravenous  Once 05/20/12 2248 05/21/12 0134   05/20/12 2300   vancomycin (VANCOCIN) IVPB 1000 mg/200 mL premix        1,000 mg 200 mL/hr over 60 Minutes Intravenous  Once 05/20/12 2248 05/20/12 2353   05/20/12 2230   vancomycin (VANCOCIN) IVPB 1000 mg/200 mL premix  Status:  Discontinued        1,000 mg 200 mL/hr over 60 Minutes Intravenous To Emergency Dept 05/20/12 2147 05/20/12 2208   05/20/12 2230   vancomycin (VANCOCIN) 2,000 mg in sodium chloride 0.9 % 500 mL IVPB  Status:  Discontinued        2,000 mg 250 mL/hr over 120 Minutes Intravenous To Emergency Dept 05/20/12 2208 05/20/12 2248         Assessment: 56 yo F started on vanc/ceftriaxone/cipro for perirectal abscess. Patient in acute renal failure. Scr increased from 1.45 to 2.52.   Goal of Therapy:  Vancomycin trough level 15-20 mcg/ml  Plan:  1. Will discontinue vancomycin 1500 mg IV q 12  hours due to big bump in scr. Will f/u scr in the AM and will re-order/re-dose vancomycin accordingly. Patient already received vancomycin dose for today.  Lodema Hong 05/21/2012,3:16 PM

## 2012-05-21 NOTE — Progress Notes (Signed)
ANTIBIOTIC CONSULT NOTE - INITIAL  Pharmacy Consult for vancomycin Indication: abscess  Allergies  Allergen Reactions  . Nsaids Anaphylaxis  . Contrast Media (Iodinated Diagnostic Agents) Swelling  . Penicillins Other (See Comments)    Childhood allergy  . Codeine Rash    Patient Measurements: Height: 5\' 1"  (154.9 cm) Weight: 382 lb 4.4 oz (173.4 kg) IBW/kg (Calculated) : 47.8  Adjusted Body Weight:   Vital Signs: Temp: 99.2 F (37.3 C) (07/12 0308) Temp src: Oral (07/12 0308) BP: 70/40 mmHg (07/12 0317) Pulse Rate: 92  (07/12 0308) Intake/Output from previous day: 07/11 0701 - 07/12 0700 In: 2000 [I.V.:2000] Out: -  Intake/Output from this shift: Total I/O In: 2000 [I.V.:2000] Out: -   Labs:  Basename 05/20/12 1348  WBC 22.5*  HGB 11.9*  PLT 179  LABCREA --  CREATININE 1.45*   Estimated Creatinine Clearance: 66.2 ml/min (by C-G formula based on Cr of 1.45). No results found for this basename: VANCOTROUGH:2,VANCOPEAK:2,VANCORANDOM:2,GENTTROUGH:2,GENTPEAK:2,GENTRANDOM:2,TOBRATROUGH:2,TOBRAPEAK:2,TOBRARND:2,AMIKACINPEAK:2,AMIKACINTROU:2,AMIKACIN:2, in the last 72 hours   Microbiology: No results found for this or any previous visit (from the past 720 hour(s)).  Medical History: Past Medical History  Diagnosis Date  . Asthma   . Hemolytic anemia   . Morbid obesity   . Knee pain, bilateral   . Arthritis     Medications:  Anti-infectives     Start     Dose/Rate Route Frequency Ordered Stop   05/21/12 2200   cefTRIAXone (ROCEPHIN) 1 g in dextrose 5 % 50 mL IVPB        1 g 100 mL/hr over 30 Minutes Intravenous Daily at bedtime 05/21/12 0327     05/21/12 1200   vancomycin (VANCOCIN) 1,500 mg in sodium chloride 0.9 % 500 mL IVPB        1,500 mg 250 mL/hr over 120 Minutes Intravenous Every 12 hours 05/21/12 0402     05/21/12 0415   ciprofloxacin (CIPRO) IVPB 400 mg        400 mg 200 mL/hr over 60 Minutes Intravenous Every 12 hours 05/21/12 0401     05/21/12 0045   cefTRIAXone (ROCEPHIN) 1 g in dextrose 5 % 50 mL IVPB        1 g 100 mL/hr over 30 Minutes Intravenous  Once 05/21/12 0032 05/21/12 0244   05/20/12 2300   vancomycin (VANCOCIN) IVPB 1000 mg/200 mL premix        1,000 mg 200 mL/hr over 60 Minutes Intravenous  Once 05/20/12 2248 05/21/12 0134   05/20/12 2300   vancomycin (VANCOCIN) IVPB 1000 mg/200 mL premix        1,000 mg 200 mL/hr over 60 Minutes Intravenous  Once 05/20/12 2248 05/20/12 2353   05/20/12 2230   vancomycin (VANCOCIN) IVPB 1000 mg/200 mL premix  Status:  Discontinued        1,000 mg 200 mL/hr over 60 Minutes Intravenous To Emergency Dept 05/20/12 2147 05/20/12 2208   05/20/12 2230   vancomycin (VANCOCIN) 2,000 mg in sodium chloride 0.9 % 500 mL IVPB  Status:  Discontinued        2,000 mg 250 mL/hr over 120 Minutes Intravenous To Emergency Dept 05/20/12 2208 05/20/12 2248         Assessment: Patient with abscess.  First dose of antibiotics already given in ED.  Goal of Therapy:  Vancomycin trough level 15-20 mcg/ml  Plan:  Measure antibiotic drug levels at steady state Follow up culture results vancomycin 1500mg  iv q12hr  Maria Stone 05/21/2012,4:10 AM

## 2012-05-21 NOTE — ED Notes (Signed)
Jamesetta So, pt's mother request to be called in morning when pt is in room. #cell- [phone]

## 2012-05-22 ENCOUNTER — Inpatient Hospital Stay (HOSPITAL_COMMUNITY): Payer: 59

## 2012-05-22 DIAGNOSIS — K612 Anorectal abscess: Secondary | ICD-10-CM

## 2012-05-22 LAB — COMPREHENSIVE METABOLIC PANEL
AST: 27 U/L (ref 0–37)
CO2: 25 mEq/L (ref 19–32)
Calcium: 8.2 mg/dL — ABNORMAL LOW (ref 8.4–10.5)
Chloride: 96 mEq/L (ref 96–112)
Creatinine, Ser: 1.93 mg/dL — ABNORMAL HIGH (ref 0.50–1.10)
GFR calc Af Amer: 32 mL/min — ABNORMAL LOW (ref >90)
GFR calc non Af Amer: 28 mL/min — ABNORMAL LOW (ref >90)
Glucose, Bld: 123 mg/dL — ABNORMAL HIGH (ref 70–99)
Total Bilirubin: 0.3 mg/dL (ref 0.3–1.2)

## 2012-05-22 LAB — CBC
Hemoglobin: 10.2 g/dL — ABNORMAL LOW (ref 12.0–15.0)
MCH: 31.1 pg (ref 26.0–34.0)
MCHC: 32.9 g/dL (ref 30.0–36.0)
Platelets: 142 10*3/uL — ABNORMAL LOW (ref 150–400)
RBC: 3.28 MIL/uL — ABNORMAL LOW (ref 3.87–5.11)

## 2012-05-22 MED ORDER — HYDROCODONE-ACETAMINOPHEN 5-325 MG PO TABS
1.0000 | ORAL_TABLET | ORAL | Status: DC | PRN
  Administered 2012-05-25 – 2012-05-27 (×10): 1 via ORAL
  Filled 2012-05-22 (×10): qty 1

## 2012-05-22 MED ORDER — VANCOMYCIN HCL 1000 MG IV SOLR
2000.0000 mg | INTRAVENOUS | Status: DC
  Administered 2012-05-22 – 2012-05-25 (×4): 2000 mg via INTRAVENOUS
  Filled 2012-05-22 (×4): qty 2000

## 2012-05-22 MED ORDER — MORPHINE SULFATE 2 MG/ML IJ SOLN
2.0000 mg | INTRAMUSCULAR | Status: DC | PRN
  Administered 2012-05-22 – 2012-05-25 (×12): 2 mg via INTRAVENOUS
  Filled 2012-05-22 (×13): qty 1

## 2012-05-22 MED ORDER — ZINC OXIDE 40 % EX OINT
TOPICAL_OINTMENT | Freq: Two times a day (BID) | CUTANEOUS | Status: DC
  Administered 2012-05-22 – 2012-05-27 (×7): via TOPICAL
  Filled 2012-05-22: qty 114

## 2012-05-22 NOTE — Progress Notes (Addendum)
ANTIBIOTIC CONSULT NOTE - Follow-up  Pharmacy Consult for vancomycin Indication: perirectal abscess  Allergies  Allergen Reactions  . Nsaids Anaphylaxis  . Contrast Media (Iodinated Diagnostic Agents) Swelling  . Penicillins Other (See Comments)    Childhood allergy  . Codeine Rash    Patient Measurements: Height: 5\' 1"  (154.9 cm) Weight: 382 lb 4.4 oz (173.4 kg) IBW/kg (Calculated) : 47.8  Adjusted Body Weight: 98kg  Vital Signs: Temp: 98.3 F (36.8 C) (07/13 0526) Temp src: Oral (07/13 0526) BP: 105/72 mmHg (07/13 0526) Pulse Rate: 84  (07/13 0526) Intake/Output from previous day: 07/12 0701 - 07/13 0700 In: 1837 [P.O.:360; I.V.:1227; IV Piggyback:250] Out: 800 [Urine:800] Intake/Output from this shift:    Labs:  Basename 05/22/12 0429 05/21/12 0415 05/20/12 1348  WBC 18.7* 21.6* 22.5*  HGB 10.2* 10.6* 11.9*  PLT 142* 149* 179  LABCREA -- -- --  CREATININE 1.93* 2.52* 1.45*   Estimated Creatinine Clearance: 49.8 ml/min (by C-G formula based on Cr of 1.93).  36 ml/min/1.16m2 (normalized)   Microbiology: No results found for this or any previous visit (from the past 720 hour(s)).  Medical History: Past Medical History  Diagnosis Date  . Asthma   . Hemolytic anemia   . Morbid obesity   . Knee pain, bilateral   . Arthritis     Medications:  Anti-infectives     Start     Dose/Rate Route Frequency Ordered Stop   05/21/12 2200   cefTRIAXone (ROCEPHIN) 1 g in dextrose 5 % 50 mL IVPB        1 g 100 mL/hr over 30 Minutes Intravenous Daily at bedtime 05/21/12 0327     05/21/12 1200   vancomycin (VANCOCIN) 1,500 mg in sodium chloride 0.9 % 500 mL IVPB  Status:  Discontinued        1,500 mg 250 mL/hr over 120 Minutes Intravenous Every 12 hours 05/21/12 0402 05/21/12 1516   05/21/12 0430   ciprofloxacin (CIPRO) IVPB 400 mg        400 mg 200 mL/hr over 60 Minutes Intravenous 2 times daily 05/21/12 0401     05/21/12 0045   cefTRIAXone (ROCEPHIN) 1 g in  dextrose 5 % 50 mL IVPB        1 g 100 mL/hr over 30 Minutes Intravenous  Once 05/21/12 0032 05/21/12 0244   05/20/12 2300   vancomycin (VANCOCIN) IVPB 1000 mg/200 mL premix        1,000 mg 200 mL/hr over 60 Minutes Intravenous  Once 05/20/12 2248 05/21/12 0134   05/20/12 2300   vancomycin (VANCOCIN) IVPB 1000 mg/200 mL premix        1,000 mg 200 mL/hr over 60 Minutes Intravenous  Once 05/20/12 2248 05/20/12 2353   05/20/12 2230   vancomycin (VANCOCIN) IVPB 1000 mg/200 mL premix  Status:  Discontinued        1,000 mg 200 mL/hr over 60 Minutes Intravenous To Emergency Dept 05/20/12 2147 05/20/12 2208   05/20/12 2230   vancomycin (VANCOCIN) 2,000 mg in sodium chloride 0.9 % 500 mL IVPB  Status:  Discontinued        2,000 mg 250 mL/hr over 120 Minutes Intravenous To Emergency Dept 05/20/12 2208 05/20/12 2248         Assessment:  56 yo F started on vanc/ceftriaxone/cipro 7/10 for perirectal abscess.   Patient in acute renal failure, attributed to hypotension with renal hypoperfusion.   Vancomycin doses received so far: 2gm 7/11 at midnight, 1500mg  7/12 at noon, has  been on hold since then  Scr remains elevated today, but better than yesterday, therefore will resume vanc today.   Goal of Therapy:  Vancomycin trough level 15-20 mcg/ml  Plan:   Vancomycin 2gm IV q24h  Check trough at steady state  Follow renal function closely  Continue ceftriaxone & cipro as ordered per MD   Loralee Pacas, PharmD, BCPS Pager: [phone] 05/22/2012,7:34 AM

## 2012-05-22 NOTE — Progress Notes (Signed)
Subjective: Feels much better- no nausea/vomitting.  Rectal pain improved.  Some discomfort over left buttocks.  Objective: Vital signs in last 24 hours: Temp:  [98.3 F (36.8 C)-99.1 F (37.3 C)] 98.3 F (36.8 C) (07/13 0526) Pulse Rate:  [84-93] 84  (07/13 0526) Resp:  [18-20] 18  (07/13 0526) BP: (105-118)/(67-74) 105/72 mmHg (07/13 0526) SpO2:  [94 %-97 %] 96 % (07/13 0526) Weight change:  Last BM Date: 05/21/12  CBG (last 3)  No results found for this basename: GLUCAP:3 in the last 72 hours  Intake/Output from previous day: 07/12 0701 - 07/13 0700 In: 1837 [P.O.:360; I.V.:1227; IV Piggyback:250] Out: 800 [Urine:800] Intake/Output this shift:    General appearance: alert and no distress Eyes: no scleral icterus Throat: oropharynx moist without erythema Resp: clear to auscultation bilaterally Cardio: regular rate and rhythm, S1, S2 normal, no murmur, click, rub or gallop GI: soft, non-tender; bowel sounds normal; no masses,  no organomegaly Extremities: no clubbing, cyanosis or edema Rectal: left medial buttocks induration with small amount of skin breakdown and erythema; +tenderness; no drainage   Lab Results:  Basename 05/22/12 0429 05/21/12 0415 05/20/12 1348  NA 131* -- 134*  K 4.3 -- 3.8  CL 96 -- 94*  CO2 25 -- 28  GLUCOSE 123* -- 126*  BUN 34* -- 19  CREATININE 1.93* 2.52* --  CALCIUM 8.2* -- 10.1  MG -- -- --  PHOS -- -- --    Basename 05/22/12 0429 05/20/12 1348  AST 27 21  ALT 23 25  ALKPHOS 70 82  BILITOT 0.3 0.8  PROT 6.2 7.1  ALBUMIN 2.6* 3.5    Basename 05/22/12 0429 05/21/12 0415 05/20/12 1348  WBC 18.7* 21.6* --  NEUTROABS -- -- 20.2*  HGB 10.2* 10.6* --  HCT 31.0* 32.0* --  MCV 94.5 93.8 --  PLT 142* 149* --   Lab Results  Component Value Date   INR 1.1 03/06/2008   No results found for this basename: CKTOTAL:3,CKMB:3,CKMBINDEX:3,TROPONINI:3 in the last 72 hours No results found for this basename:  TSH,T4TOTAL,FREET3,T3FREE,THYROIDAB in the last 72 hours No results found for this basename: VITAMINB12:2,FOLATE:2,FERRITIN:2,TIBC:2,IRON:2,RETICCTPCT:2 in the last 72 hours  Studies/Results: Ct Pelvis Wo Contrast  05/20/2012  *RADIOLOGY REPORT*  Clinical Data:  Rectal abscess  CT PELVIS WITHOUT CONTRAST  Technique:  Multidetector CT imaging of the pelvis was performed following the standard protocol without intravenous contrast.  Comparison:  03/07/2008  Findings:  Noncontrast images of the pelvis demonstrate abnormal stranding extending from the left ischiorectal fossa region inferiorly into the subcutaneous tissues, with a small amount of gas density in the soft tissues in the left lower perianal region which could represent a perianal fistula.  A well-defined fluid density abscess is not identified, although IV contrast could not be provided due to a dye allergy.  Inflammatory stranding extends in the subcutaneous tissues of the left buttock.  The appendix appears normal.  There is an abnormal appearance of the cecum which is thick-walled.  The terminal ileum appears unremarkable.  Sigmoid diverticulosis noted without active diverticulitis.  Small bilateral inguinal lymph nodes are present.  IMPRESSION:  1.  Abnormal inflammatory stranding in the left ischiorectal fossa and perianal region, with a small amount of gas within the soft tissue density in the left perianal subcutaneous region, conceivably representing a perianal fistula or a small abscess.  A well-defined fluid density abscess is not observed, although IV contrast cannot be given due to a contrast allergy.  If clinically warranted, MRI using  perianal fistula protocol with and without contrast could provide considerable regional detail. 2.  Suspected wall thickening in the cecum, without abnormality of the appendix or terminal ileum.  Inflammation involving the cecum is not excluded. 3.  Sigmoid diverticulosis without active diverticulitis   Original Report Authenticated By: Dellia Cloud, M.D.     Medications: Scheduled:   . cefTRIAXone (ROCEPHIN)  IV  1 g Intravenous QHS  . cholecalciferol  1,000 Units Oral Daily  . ciprofloxacin  400 mg Intravenous BID  . enoxaparin (LOVENOX) injection  40 mg Subcutaneous Q24H  . folic acid  1 mg Oral Daily  . multivitamin with minerals  1 tablet Oral Daily  . ondansetron  4 mg Intravenous Once  . vancomycin  2,000 mg Intravenous Q24H  . DISCONTD: vancomycin  1,500 mg Intravenous Q12H   Continuous:   . sodium chloride Stopped (05/21/12 1157)  . 0.9 % NaCl with KCl 20 mEq / L 125 mL/hr at 05/22/12 1610    Assessment/Plan: Principal Problem: 1. *Perirectal abscess- unable to peform MRI or CT with contrast to evaluate further due to obesity/renal failure.  Her clinical picture is improving with decreased pain, leukocytosis and resolution of fever.  Will continue broad spectrum antibiotics.  Will consult general surgery to consider I&D, if warranted.   Active Problems: 2. Urinary tract infection- urine culture was apparently not obtained on admission.  Will obtain now though unlikely to yield anything.  Current antibiotics should be sufficient. 3. Hypotension/possible sepsis- improved with IVF hydration.  Obtain blood cultures (not obtained on admission).  Continue IV fluids, IV antibiotics. 4. Acute Renal Failure- improving with hydration consistent with prerenal azotemia vs. Mild ATN due to infection/hypotension.  Monitor.  ACE inhibitor on hold. 5. Morbid Obesity- limits ability to do MRI. 6. Nausea and vomiting- improved.  Continue Zofran prn. 7. Stage 2 Sacral Decubitus ulcer- topical dressing and rotate patient.    LOS: 2 days   Nichoel Digiulio,W DOUGLAS 05/22/2012, 9:47 AM

## 2012-05-22 NOTE — Consult Note (Signed)
Reason for Consult:possible perirectal abscess Referring Physician: [name] is an 56 y.o. female.  HPI: we are asked to evaluate this patient for a possible perirectal abscess. She states that her symptoms all began approximately 6 days ago with some perianal discomfort and she thought that she had problems with hemorrhoids. She tried treatments with Preparation H and topical hemorrhoid treatments but she received no relief. She says that the discomfort in the area gradually increased each day until she saw her primary care physician yesterday for evaluation. She was admitted to the hospital with some concern for possible sepsis. She says that she has had chills but has not had any fevers. She denies any drainage from the area of. She says her bowels have been normal but she did recently use a Dulcolax and after this she had liquid stools. During this admission she had a CT scan of the pelvis without contrast which showed some inflammatory changes in the area of the left buttock region and pararectal area without a definite fluid collection. Since admission and being on antibiotics, she says that her pain has greatly improved.  Past Medical History  Diagnosis Date  . Asthma   . Hemolytic anemia   . Morbid obesity   . Knee pain, bilateral   . Arthritis     History reviewed. No pertinent past surgical history.  History reviewed. No pertinent family history.  Social History:  reports that she has quit smoking. She does not have any smokeless tobacco history on file. She reports that she drinks alcohol. Her drug history not on file.  Allergies:  Allergies  Allergen Reactions  . Nsaids Anaphylaxis  . Contrast Media (Iodinated Diagnostic Agents) Swelling  . Penicillins Other (See Comments)    Childhood allergy  . Codeine Rash   All other review of systems negative or noncontributory except as stated in the HPI  Medications: I have reviewed the patient's current  medications.  Results for orders placed during the hospital encounter of 05/20/12 (from the past 48 hour(s))  CBC     Status: Abnormal   Collection Time   05/21/12  4:15 AM      Component Value Range Comment   WBC 21.6 (*) 4.0 - 10.5 K/uL    RBC 3.41 (*) 3.87 - 5.11 MIL/uL    Hemoglobin 10.6 (*) 12.0 - 15.0 g/dL    HCT 16.1 (*) 09.6 - 46.0 %    MCV 93.8  78.0 - 100.0 fL    MCH 31.1  26.0 - 34.0 pg    MCHC 33.1  30.0 - 36.0 g/dL    RDW 04.5  40.9 - 81.1 %    Platelets 149 (*) 150 - 400 K/uL   CREATININE, SERUM     Status: Abnormal   Collection Time   05/21/12  4:15 AM      Component Value Range Comment   Creatinine, Ser 2.52 (*) 0.50 - 1.10 mg/dL    GFR calc non Af Amer 20 (*) >90 mL/min    GFR calc Af Amer 23 (*) >90 mL/min   COMPREHENSIVE METABOLIC PANEL     Status: Abnormal   Collection Time   05/22/12  4:29 AM      Component Value Range Comment   Sodium 131 (*) 135 - 145 mEq/L    Potassium 4.3  3.5 - 5.1 mEq/L    Chloride 96  96 - 112 mEq/L    CO2 25  19 - 32 mEq/L  Glucose, Bld 123 (*) 70 - 99 mg/dL    BUN 34 (*) 6 - 23 mg/dL DELTA CHECK NOTED   Creatinine, Ser 1.93 (*) 0.50 - 1.10 mg/dL    Calcium 8.2 (*) 8.4 - 10.5 mg/dL    Total Protein 6.2  6.0 - 8.3 g/dL    Albumin 2.6 (*) 3.5 - 5.2 g/dL    AST 27  0 - 37 U/L    ALT 23  0 - 35 U/L    Alkaline Phosphatase 70  39 - 117 U/L    Total Bilirubin 0.3  0.3 - 1.2 mg/dL    GFR calc non Af Amer 28 (*) >90 mL/min    GFR calc Af Amer 32 (*) >90 mL/min   CBC     Status: Abnormal   Collection Time   05/22/12  4:29 AM      Component Value Range Comment   WBC 18.7 (*) 4.0 - 10.5 K/uL    RBC 3.28 (*) 3.87 - 5.11 MIL/uL    Hemoglobin 10.2 (*) 12.0 - 15.0 g/dL    HCT 62.9 (*) 52.8 - 46.0 %    MCV 94.5  78.0 - 100.0 fL    MCH 31.1  26.0 - 34.0 pg    MCHC 32.9  30.0 - 36.0 g/dL    RDW 41.3  24.4 - 01.0 %    Platelets 142 (*) 150 - 400 K/uL     Ct Pelvis Wo Contrast  05/20/2012  *RADIOLOGY REPORT*  Clinical Data:  Rectal  abscess  CT PELVIS WITHOUT CONTRAST  Technique:  Multidetector CT imaging of the pelvis was performed following the standard protocol without intravenous contrast.  Comparison:  03/07/2008  Findings:  Noncontrast images of the pelvis demonstrate abnormal stranding extending from the left ischiorectal fossa region inferiorly into the subcutaneous tissues, with a small amount of gas density in the soft tissues in the left lower perianal region which could represent a perianal fistula.  A well-defined fluid density abscess is not identified, although IV contrast could not be provided due to a dye allergy.  Inflammatory stranding extends in the subcutaneous tissues of the left buttock.  The appendix appears normal.  There is an abnormal appearance of the cecum which is thick-walled.  The terminal ileum appears unremarkable.  Sigmoid diverticulosis noted without active diverticulitis.  Small bilateral inguinal lymph nodes are present.  IMPRESSION:  1.  Abnormal inflammatory stranding in the left ischiorectal fossa and perianal region, with a small amount of gas within the soft tissue density in the left perianal subcutaneous region, conceivably representing a perianal fistula or a small abscess.  A well-defined fluid density abscess is not observed, although IV contrast cannot be given due to a contrast allergy.  If clinically warranted, MRI using perianal fistula protocol with and without contrast could provide considerable regional detail. 2.  Suspected wall thickening in the cecum, without abnormality of the appendix or terminal ileum.  Inflammation involving the cecum is not excluded. 3.  Sigmoid diverticulosis without active diverticulitis  Original Report Authenticated By: Dellia Cloud, M.D.     Blood pressure 102/67, pulse 88, temperature 98 F (36.7 C), temperature source Oral, resp. rate 18, height 5\' 1"  (1.549 m), weight 382 lb 4.4 oz (173.4 kg), SpO2 97.00%. General appearance: alert, cooperative  and no distress Head: Normocephalic, without obvious abnormality, atraumatic Neck: no JVD and supple, symmetrical, trachea midline Resp: nonlabored Cardio: normal rate  Extremities: venous stasis dermatitis noted Skin: erythema and tenderness in  the left buttock region with some skin excoriation, no obvious fluctuance or definite fluid collection identified.  Neurologic: Grossly normal Rectal: no internal masses, and no significant tenderness, no fluctuance  Assessment/Plan: Buttock cellulitis and possible abscess I am concerned that she has a soft tissue perirectal abscess or buttock abscess however, I cannot identify any obvious area of fluctuance or drainable fluid collection at this time. She has some erythema and some excoriated skin in the area of the left buttock region but again, there is not an obvious site for an abscess. I explained that even if we do not identify an abscess cavity at this time she may develop once in the next few days.  I have recommended soft tissue ultrasound of the area to see if we can identify a fluid collection. If she has a defined abscess, then we will plan for formal drainage in the operating room. If this appears to be cellulitis that recommend continuing the antibiotics and see if this improves with antibiotic treatment alone. Meanwhile I would recommend a topical skin barrier for the excoriated skin.  Surgery will follow along as well.  Lodema Pilot DAVID 05/22/2012, 4:20 PM

## 2012-05-23 LAB — BASIC METABOLIC PANEL
BUN: 32 mg/dL — ABNORMAL HIGH (ref 6–23)
Calcium: 7.7 mg/dL — ABNORMAL LOW (ref 8.4–10.5)
Creatinine, Ser: 1.65 mg/dL — ABNORMAL HIGH (ref 0.50–1.10)
GFR calc Af Amer: 39 mL/min — ABNORMAL LOW (ref >90)
GFR calc non Af Amer: 33 mL/min — ABNORMAL LOW (ref >90)
Glucose, Bld: 94 mg/dL (ref 70–99)

## 2012-05-23 LAB — CBC
HCT: 29.9 % — ABNORMAL LOW (ref 36.0–46.0)
Hemoglobin: 9.6 g/dL — ABNORMAL LOW (ref 12.0–15.0)
MCH: 30.2 pg (ref 26.0–34.0)
MCHC: 32.1 g/dL (ref 30.0–36.0)
RDW: 12.4 % (ref 11.5–15.5)

## 2012-05-23 NOTE — Progress Notes (Signed)
Subjective: Continues to feel better. Pain improved. [name] negative for abscess cavity  Objective: Vital signs in last 24 hours: Temp:  [98 F (36.7 C)-99.3 F (37.4 C)] 99 F (37.2 C) (07/14 0540) Pulse Rate:  [87-95] 87  (07/14 0540) Resp:  [18] 18  (07/14 0540) BP: (92-108)/(60-67) 108/66 mmHg (07/14 0540) SpO2:  [97 %-99 %] 98 % (07/14 0540) Last BM Date: 05/22/12  Intake/Output from previous day: 07/13 0701 - 07/14 0700 In: -  Out: 1325 [Urine:1325] Intake/Output this shift:    General appearance: alert, cooperative and no distress Skin: she still has some erythema and induration mainly in left buttock region, tender in area of skin excoriation but no tenderness in perineal or perirectal area.  still no fluctuance  Lab Results:   Basename 05/23/12 0419 05/22/12 0429  WBC 16.8* 18.7*  HGB 9.6* 10.2*  HCT 29.9* 31.0*  PLT 141* 142*   BMET  Basename 05/23/12 0419 05/22/12 0429  NA 133* 131*  K 4.2 4.3  CL 101 96  CO2 23 25  GLUCOSE 94 123*  BUN 32* 34*  CREATININE 1.65* 1.93*  CALCIUM 7.7* 8.2*   PT/INR No results found for this basename: LABPROT:2,INR:2 in the last 72 hours ABG No results found for this basename: PHART:2,PCO2:2,PO2:2,HCO3:2 in the last 72 hours  Studies/Results: US Pelvis Limited  05/23/2012  *RADIOLOGY REPORT*  Clinical Data: Left buttock erythema. Evaluate for abscess.  US PELVIS LIMITED OR FOLLOW UP  Comparison: 05/20/2012 CT  Findings: Focused sonographic images in the area of skin induration demonstrate no associated loculated fluid collection.  Mild subcutaneous fat edema.  Note that these images were unable to evaluate the ischioanal fossa or perianal space, where an abscess was questioned on the recent CT.  IMPRESSION: No superficial abscess identified.  Note that these images do not evaluate the ischioanal fossa or perianal space, where an abscess was questioned on the recent CT. MRI recommended to evaluate for perianal abscess.   Original Report Authenticated By: Waneta Martins, M.D.    Anti-infectives: Anti-infectives     Start     Dose/Rate Route Frequency Ordered Stop   05/22/12 1000   vancomycin (VANCOCIN) 2,000 mg in sodium chloride 0.9 % 500 mL IVPB        2,000 mg 250 mL/hr over 120 Minutes Intravenous Every 24 hours 05/22/12 0738     05/21/12 2200   cefTRIAXone (ROCEPHIN) 1 g in dextrose 5 % 50 mL IVPB        1 g 100 mL/hr over 30 Minutes Intravenous Daily at bedtime 05/21/12 0327     05/21/12 1200   vancomycin (VANCOCIN) 1,500 mg in sodium chloride 0.9 % 500 mL IVPB  Status:  Discontinued        1,500 mg 250 mL/hr over 120 Minutes Intravenous Every 12 hours 05/21/12 0402 05/21/12 1516   05/21/12 0430   ciprofloxacin (CIPRO) IVPB 400 mg        400 mg 200 mL/hr over 60 Minutes Intravenous 2 times daily 05/21/12 0401     05/21/12 0045   cefTRIAXone (ROCEPHIN) 1 g in dextrose 5 % 50 mL IVPB        1 g 100 mL/hr over 30 Minutes Intravenous  Once 05/21/12 0032 05/21/12 0244   05/20/12 2300   vancomycin (VANCOCIN) IVPB 1000 mg/200 mL premix        1,000 mg 200 mL/hr over 60 Minutes Intravenous  Once 05/20/12 2248 05/21/12 0134   05/20/12 2300   vancomycin (  VANCOCIN) IVPB 1000 mg/200 mL premix        1,000 mg 200 mL/hr over 60 Minutes Intravenous  Once 05/20/12 2248 05/20/12 2353   05/20/12 2230   vancomycin (VANCOCIN) IVPB 1000 mg/200 mL premix  Status:  Discontinued        1,000 mg 200 mL/hr over 60 Minutes Intravenous To Emergency Dept 05/20/12 2147 05/20/12 2208   05/20/12 2230   vancomycin (VANCOCIN) 2,000 mg in sodium chloride 0.9 % 500 mL IVPB  Status:  Discontinued        2,000 mg 250 mL/hr over 120 Minutes Intravenous To Emergency Dept 05/20/12 2208 05/20/12 2248          Assessment/Plan: s/p * No surgery found * This is difficult because we still cannot rule out an abscess.  She does have some erythema and tenderness and I think that she likely has a developing abscess but I  cannot localize this.  I reviewed the images with the radiologist this am and the [name] does not show a fluid collection but the CT was concerning for abscess in the perirectal area.  On exam, she does not have any tenderness in the perineum or perirectal area and her tenderness is in the buttock region, but again no fluid collection was seen in this area.  The radiologist recommended CT or MRI with contrast but she is allergic to contrast and with current renal function she is not a good candidate (also she is too large for the MRI). Continue current antibiotics and if she has an abscess this will manifest, if cellulitis only, this should improve with abx.  Okay for diet for today.  LOS: 3 days    Lodema Pilot DAVID 05/23/2012

## 2012-05-23 NOTE — Progress Notes (Signed)
Subjective: Feeling better.  Less discomfort in buttocks.  NPO this am per [facility] pending ultrasound results.  Objective: Vital signs in last 24 hours: Temp:  [98 F (36.7 C)-99.3 F (37.4 C)] 99 F (37.2 C) (07/14 0540) Pulse Rate:  [87-95] 87  (07/14 0540) Resp:  [18] 18  (07/14 0540) BP: (92-108)/(60-67) 108/66 mmHg (07/14 0540) SpO2:  [97 %-99 %] 98 % (07/14 0540) Weight change:  Last BM Date: 05/22/12  CBG (last 3)  No results found for this basename: GLUCAP:3 in the last 72 hours  Intake/Output from previous day: 07/13 0701 - 07/14 0700 In: -  Out: 1325 [Urine:1325] Intake/Output this shift:    General appearance: alert and no distress Eyes: no scleral icterus Throat: oropharynx moist without erythema Resp: clear to auscultation bilaterally Cardio: regular rate and rhythm, S1, S2 normal, no murmur, click, rub or gallop GI: soft, non-tender; bowel sounds normal; no masses,  no organomegaly Extremities: no clubbing, cyanosis or edema Rectal: less induration over left medial buttocks; paste obscures skin visualization   Lab Results:  Basename 05/23/12 0419 05/22/12 0429  NA 133* 131*  K 4.2 4.3  CL 101 96  CO2 23 25  GLUCOSE 94 123*  BUN 32* 34*  CREATININE 1.65* 1.93*  CALCIUM 7.7* 8.2*  MG -- --  PHOS -- --    Basename 05/22/12 0429 05/20/12 1348  AST 27 21  ALT 23 25  ALKPHOS 70 82  BILITOT 0.3 0.8  PROT 6.2 7.1  ALBUMIN 2.6* 3.5    Basename 05/23/12 0419 05/22/12 0429 05/20/12 1348  WBC 16.8* 18.7* --  NEUTROABS -- -- 20.2*  HGB 9.6* 10.2* --  HCT 29.9* 31.0* --  MCV 94.0 94.5 --  PLT 141* 142* --   Lab Results  Component Value Date   INR 1.1 03/06/2008   No results found for this basename: CKTOTAL:3,CKMB:3,CKMBINDEX:3,TROPONINI:3 in the last 72 hours No results found for this basename: TSH,T4TOTAL,FREET3,T3FREE,THYROIDAB in the last 72 hours No results found for this basename: VITAMINB12:2,FOLATE:2,FERRITIN:2,TIBC:2,IRON:2,RETICCTPCT:2 in  the last 72 hours  Studies/Results: US Pelvis Limited  05/23/2012  *RADIOLOGY REPORT*  Clinical Data: Left buttock erythema. Evaluate for abscess.  US PELVIS LIMITED OR FOLLOW UP  Comparison: 05/20/2012 CT  Findings: Focused sonographic images in the area of skin induration demonstrate no associated loculated fluid collection.  Mild subcutaneous fat edema.  Note that these images were unable to evaluate the ischioanal fossa or perianal space, where an abscess was questioned on the recent CT.  IMPRESSION: No superficial abscess identified.  Note that these images do not evaluate the ischioanal fossa or perianal space, where an abscess was questioned on the recent CT. MRI recommended to evaluate for perianal abscess.  Original Report Authenticated By: Waneta Martins, M.D.     Medications: Scheduled:   . cefTRIAXone (ROCEPHIN)  IV  1 g Intravenous QHS  . cholecalciferol  1,000 Units Oral Daily  . ciprofloxacin  400 mg Intravenous BID  . enoxaparin (LOVENOX) injection  40 mg Subcutaneous Q24H  . folic acid  1 mg Oral Daily  . liver oil-zinc oxide   Topical BID  . multivitamin with minerals  1 tablet Oral Daily  . ondansetron  4 mg Intravenous Once  . vancomycin  2,000 mg Intravenous Q24H   Continuous:   . 0.9 % NaCl with KCl 20 mEq / L 125 mL/hr at 05/23/12 0310    Assessment/Plan: Principal Problem:  1. *Perirectal abscess- unable to peform MRI or CT with contrast to  evaluate further due to obesity/renal failure. Her clinical picture is improving with decreased pain, leukocytosis and resolution of fever. Will continue broad spectrum antibiotics. Ultrasound unrevealing but limited.  Appreciate surgical evaluation. Active Problems:  2. Urinary tract infection- urine culture pending (was obtained yesterday after being on antibiotics).  Current antibiotics should be sufficient.  3. Hypotension/possible sepsis- improved with IVF hydration. Blood cultures pending (not obtained on admission).  Continue IV fluids, IV antibiotics.  4. Acute Renal Failure- improving with hydration consistent with prerenal azotemia vs. Mild ATN due to infection/hypotension. Monitor. ACE inhibitor on hold.  5. Morbid Obesity- limits ability to do MRI.  6. Nausea and vomiting- improved. Continue Zofran prn.  7. Stage 2 Sacral Decubitus ulcer- continue topical paste 8. Disposition- anticipate discharge in 2-3 days on oral or IV antibiotics if no surgery needed.     LOS: 3 days   Erla Bacchi,W DOUGLAS 05/23/2012, 8:58 AM

## 2012-05-24 LAB — BASIC METABOLIC PANEL
Calcium: 7.9 mg/dL — ABNORMAL LOW (ref 8.4–10.5)
GFR calc Af Amer: 39 mL/min — ABNORMAL LOW (ref >90)
GFR calc non Af Amer: 33 mL/min — ABNORMAL LOW (ref >90)
Glucose, Bld: 103 mg/dL — ABNORMAL HIGH (ref 70–99)
Sodium: 131 mEq/L — ABNORMAL LOW (ref 135–145)

## 2012-05-24 LAB — URINE CULTURE
Colony Count: NO GROWTH
Culture: NO GROWTH

## 2012-05-24 LAB — CBC
MCH: 30.6 pg (ref 26.0–34.0)
MCHC: 32.4 g/dL (ref 30.0–36.0)
Platelets: 152 10*3/uL (ref 150–400)
RDW: 12.5 % (ref 11.5–15.5)

## 2012-05-24 LAB — SURGICAL PCR SCREEN
MRSA, PCR: NEGATIVE
Staphylococcus aureus: NEGATIVE

## 2012-05-24 MED ORDER — ONDANSETRON HCL 4 MG PO TABS: 4.0000 mg | ORAL_TABLET | ORAL | Status: DC | PRN

## 2012-05-24 MED ORDER — ONDANSETRON HCL 4 MG/2ML IJ SOLN
4.0000 mg | INTRAMUSCULAR | Status: DC | PRN
  Administered 2012-05-24 (×2): 4 mg via INTRAVENOUS

## 2012-05-24 NOTE — Progress Notes (Signed)
Patient ID: Maria Stone, female   DOB: 1956-06-14, 56 y.o.   MRN: [id number] [facility] Progress Note:   * No surgery found *  Subjective: Mental status is clear Objective: Vital signs in last 24 hours: Temp:  [99.3 F (37.4 C)-101.4 F (38.6 C)] 99.5 F (37.5 C) (07/15 0848) Pulse Rate:  [90-99] 93  (07/15 0530) Resp:  [16-20] 16  (07/15 0530) BP: (103-118)/(62-72) 103/63 mmHg (07/15 0530) SpO2:  [97 %-98 %] 97 % (07/15 0530)  Intake/Output from previous day: 07/14 0701 - 07/15 0700 In: 3436 [I.V.:3436] Out: 1550 [Urine:1550] Intake/Output this shift: Total I/O In: 475 [I.V.:475] Out: -   Physical Exam: Work of breathing is  Normal.  Significant induration of the left buttocks consistent with a perirectal abscess.  Patient has already eaten today.  Stable.  Will add on schedule for drainage tomorrow.  Informed consent performed  Lab Results:  Results for orders placed during the hospital encounter of 05/20/12 (from the past 48 hour(s))  CBC     Status: Abnormal   Collection Time   05/23/12  4:19 AM      Component Value Range Comment   WBC 16.8 (*) 4.0 - 10.5 K/uL    RBC 3.18 (*) 3.87 - 5.11 MIL/uL    Hemoglobin 9.6 (*) 12.0 - 15.0 g/dL    HCT 82.9 (*) 56.2 - 46.0 %    MCV 94.0  78.0 - 100.0 fL    MCH 30.2  26.0 - 34.0 pg    MCHC 32.1  30.0 - 36.0 g/dL    RDW 13.0  86.5 - 78.4 %    Platelets 141 (*) 150 - 400 K/uL   BASIC METABOLIC PANEL     Status: Abnormal   Collection Time   05/23/12  4:19 AM      Component Value Range Comment   Sodium 133 (*) 135 - 145 mEq/L    Potassium 4.2  3.5 - 5.1 mEq/L    Chloride 101  96 - 112 mEq/L    CO2 23  19 - 32 mEq/L    Glucose, Bld 94  70 - 99 mg/dL    BUN 32 (*) 6 - 23 mg/dL    Creatinine, Ser 6.96 (*) 0.50 - 1.10 mg/dL    Calcium 7.7 (*) 8.4 - 10.5 mg/dL    GFR calc non Af Amer 33 (*) >90 mL/min    GFR calc Af Amer 39 (*) >90 mL/min   CBC     Status: Abnormal   Collection Time   05/24/12  3:51 AM   Component Value Range Comment   WBC 15.6 (*) 4.0 - 10.5 K/uL    RBC 3.17 (*) 3.87 - 5.11 MIL/uL    Hemoglobin 9.7 (*) 12.0 - 15.0 g/dL    HCT 29.5 (*) 28.4 - 46.0 %    MCV 94.3  78.0 - 100.0 fL    MCH 30.6  26.0 - 34.0 pg    MCHC 32.4  30.0 - 36.0 g/dL    RDW 13.2  44.0 - 10.2 %    Platelets 152  150 - 400 K/uL   BASIC METABOLIC PANEL     Status: Abnormal   Collection Time   05/24/12  3:51 AM      Component Value Range Comment   Sodium 131 (*) 135 - 145 mEq/L    Potassium 4.2  3.5 - 5.1 mEq/L    Chloride 100  96 - 112 mEq/L    CO2  23  19 - 32 mEq/L    Glucose, Bld 103 (*) 70 - 99 mg/dL    BUN 24 (*) 6 - 23 mg/dL    Creatinine, Ser 4.09 (*) 0.50 - 1.10 mg/dL    Calcium 7.9 (*) 8.4 - 10.5 mg/dL    GFR calc non Af Amer 33 (*) >90 mL/min    GFR calc Af Amer 39 (*) >90 mL/min     Radiology/Results: US Pelvis Limited  05/23/2012  *RADIOLOGY REPORT*  Clinical Data: Left buttock erythema. Evaluate for abscess.  US PELVIS LIMITED OR FOLLOW UP  Comparison: 05/20/2012 CT  Findings: Focused sonographic images in the area of skin induration demonstrate no associated loculated fluid collection.  Mild subcutaneous fat edema.  Note that these images were unable to evaluate the ischioanal fossa or perianal space, where an abscess was questioned on the recent CT.  IMPRESSION: No superficial abscess identified.  Note that these images do not evaluate the ischioanal fossa or perianal space, where an abscess was questioned on the recent CT. MRI recommended to evaluate for perianal abscess.  Original Report Authenticated By: Waneta Martins, M.D.    Anti-infectives: Anti-infectives     Start     Dose/Rate Route Frequency Ordered Stop   05/22/12 1000   vancomycin (VANCOCIN) 2,000 mg in sodium chloride 0.9 % 500 mL IVPB        2,000 mg 250 mL/hr over 120 Minutes Intravenous Every 24 hours 05/22/12 0738     05/21/12 2200   cefTRIAXone (ROCEPHIN) 1 g in dextrose 5 % 50 mL IVPB        1 g 100 mL/hr  over 30 Minutes Intravenous Daily at bedtime 05/21/12 0327     05/21/12 1200   vancomycin (VANCOCIN) 1,500 mg in sodium chloride 0.9 % 500 mL IVPB  Status:  Discontinued        1,500 mg 250 mL/hr over 120 Minutes Intravenous Every 12 hours 05/21/12 0402 05/21/12 1516   05/21/12 0430   ciprofloxacin (CIPRO) IVPB 400 mg        400 mg 200 mL/hr over 60 Minutes Intravenous 2 times daily 05/21/12 0401     05/21/12 0045   cefTRIAXone (ROCEPHIN) 1 g in dextrose 5 % 50 mL IVPB        1 g 100 mL/hr over 30 Minutes Intravenous  Once 05/21/12 0032 05/21/12 0244   05/20/12 2300   vancomycin (VANCOCIN) IVPB 1000 mg/200 mL premix        1,000 mg 200 mL/hr over 60 Minutes Intravenous  Once 05/20/12 2248 05/21/12 0134   05/20/12 2300   vancomycin (VANCOCIN) IVPB 1000 mg/200 mL premix        1,000 mg 200 mL/hr over 60 Minutes Intravenous  Once 05/20/12 2248 05/20/12 2353   05/20/12 2230   vancomycin (VANCOCIN) IVPB 1000 mg/200 mL premix  Status:  Discontinued        1,000 mg 200 mL/hr over 60 Minutes Intravenous To Emergency Dept 05/20/12 2147 05/20/12 2208   05/20/12 2230   vancomycin (VANCOCIN) 2,000 mg in sodium chloride 0.9 % 500 mL IVPB  Status:  Discontinued        2,000 mg 250 mL/hr over 120 Minutes Intravenous To Emergency Dept 05/20/12 2208 05/20/12 2248          Assessment/Plan: Problem List: Patient Active Problem List  Diagnosis  . Perirectal abscess  . Urinary tract infection  . Hypotension  . Obesity  . Nausea and vomiting  .  Hypertension  . Osteoporosis  . Hyperlipidemia  . Osteoarthritis of both knees  . Anemia, hemolytic  . Asthma  . Chronic venous insufficiency    Perirectal abscess.  Plan I & D in the OR * No surgery found *    LOS: 4 days   Matt B. Daphine Deutscher, MD, [facility] [phone] beeper [phone]  05/24/2012 2:50 PM

## 2012-05-24 NOTE — Progress Notes (Signed)
Subjective:  Spiked a fever last night, but still feels better. She is on her back and has her buttocks on bed with out issue.  Only mild pain. No Nausea.  Complete Home Medication List:  1) Folic Acid 1 Mg Tabs (Folic acid) .... Take one tablet by mouth every day  2) Furosemide 40 Mg Tabs (Furosemide) .Marland Kitchen.. 1 tab po qd prn  3) Multivitamins Tabs (Multiple vitamin) .... Take one tablet by mouth every day  4) Niacin 500 Mg Tabs (Niacin) .Marland Kitchen.. 1 tab po qd  5) Osteo Bi-flex Regular Strength 250-200 Mg Tabs (Glucosamine-chondroitin) .... 2 tabs po qd  6) Proventil Hfa 108 (90 Base) Mcg/act Aers (Albuterol sulfate) .... Prn  7) Spironolactone-hctz 25-25 Mg Tabs (Spironolactone-hctz) .Marland Kitchen.. 1 tab po qd  8) Vitamin D-400 Tabs (Cholecalciferol tabs) .... Take one tablet by mouth every day  9) Lidoderm 5 % Ptch (Lidocaine) .... Apply patch once daily for 12 hours, then remove for 12 hours  10) Tramadol Hcl 50 Mg Tabs (Tramadol hcl) .... One po daily prn.  11) Lisinopril 10 Mg Tabs (Lisinopril) .Marland Kitchen.. 1 po qd  12) Fosamax 70 Mg Tabs (Alendronate sodium) .Marland Kitchen.. 1 po every week  Objective: Vital signs in last 24 hours: Temp:  [99.3 F (37.4 C)-101.4 F (38.6 C)] 100 F (37.8 C) (07/15 0530) Pulse Rate:  [90-99] 93  (07/15 0530) Resp:  [16-20] 16  (07/15 0530) BP: (103-118)/(62-72) 103/63 mmHg (07/15 0530) SpO2:  [97 %-98 %] 97 % (07/15 0530) Weight change:  Last BM Date: 05/22/12  CBG (last 3)  No results found for this basename: GLUCAP:3 in the last 72 hours  Intake/Output from previous day:  Intake/Output Summary (Last 24 hours) at 05/24/12 0701 Last data filed at 05/24/12 1610  Gross per 24 hour  Intake   3436 ml  Output   1550 ml  Net   1886 ml   07/14 0701 - 07/15 0700 In: 3436 [I.V.:3436] Out: 1550 [Urine:1550]   Physical Exam  General appearance:  A and O, no distress  L Buttock: Skin with some erythema and induration mainly in left buttock region, tender in area of skin  excoriation but no tenderness in perineal or perirectal area. still no fluctuance  Resp: CTA Cardio: Reg Extremities: Thick.  Lab Results:  [facility] 05/24/12 0351 05/23/12 0419  NA 131* 133*  K 4.2 4.2  CL 100 101  CO2 23 23  GLUCOSE 103* 94  BUN 24* 32*  CREATININE 1.65* 1.65*  CALCIUM 7.9* 7.7*  MG -- --  PHOS -- --     Basename 05/22/12 0429  AST 27  ALT 23  ALKPHOS 70  BILITOT 0.3  PROT 6.2  ALBUMIN 2.6*     Basename 05/24/12 0351 05/23/12 0419  WBC 15.6* 16.8*  NEUTROABS -- --  HGB 9.7* 9.6*  HCT 29.9* 29.9*  MCV 94.3 94.0  PLT 152 141*    Lab Results  Component Value Date   INR 1.1 03/06/2008    No results found for this basename: CKTOTAL:3,CKMB:3,CKMBINDEX:3,TROPONINI:3 in the last 72 hours  No results found for this basename: TSH,T4TOTAL,FREET3,T3FREE,THYROIDAB in the last 72 hours  No results found for this basename: VITAMINB12:2,FOLATE:2,FERRITIN:2,TIBC:2,IRON:2,RETICCTPCT:2 in the last 72 hours  Micro Results: Recent Results (from the past 240 hour(s))  URINE CULTURE     Status: Normal   Collection Time   05/22/12 11:28 AM      Component Value Range Status Comment   Specimen Description URINE, RANDOM   Final  Special Requests NONE   Final    Culture  Setup Time 05/22/2012 21:41   Final    Colony Count NO GROWTH   Final    Culture NO GROWTH   Final    Report Status 05/24/2012 FINAL   Final   CULTURE, BLOOD (ROUTINE X 2)     Status: Normal (Preliminary result)   Collection Time   05/22/12 11:35 AM      Component Value Range Status Comment   Specimen Description BLOOD RIGHT ARM   Final    Special Requests     Final    Value: BOTTLES DRAWN AEROBIC AND ANAEROBIC 6CC BOTH BOTTLES   Culture  Setup Time 05/22/2012 21:40   Final    Culture     Final    Value:        BLOOD CULTURE RECEIVED NO GROWTH TO DATE CULTURE WILL BE HELD FOR 5 DAYS BEFORE ISSUING A FINAL NEGATIVE REPORT   Report Status PENDING   Incomplete   CULTURE, BLOOD (ROUTINE X 2)      Status: Normal (Preliminary result)   Collection Time   05/22/12 11:41 AM      Component Value Range Status Comment   Specimen Description BLOOD RIGHT ARM   Final    Special Requests     Final    Value: BOTTLES DRAWN AEROBIC AND ANAEROBIC 5CC BOTH BOTTLES   Culture  Setup Time 05/22/2012 21:40   Final    Culture     Final    Value:        BLOOD CULTURE RECEIVED NO GROWTH TO DATE CULTURE WILL BE HELD FOR 5 DAYS BEFORE ISSUING A FINAL NEGATIVE REPORT   Report Status PENDING   Incomplete      Studies/Results: US Pelvis Limited  05/23/2012  *RADIOLOGY REPORT*  Clinical Data: Left buttock erythema. Evaluate for abscess.  US PELVIS LIMITED OR FOLLOW UP  Comparison: 05/20/2012 CT  Findings: Focused sonographic images in the area of skin induration demonstrate no associated loculated fluid collection.  Mild subcutaneous fat edema.  Note that these images were unable to evaluate the ischioanal fossa or perianal space, where an abscess was questioned on the recent CT.  IMPRESSION: No superficial abscess identified.  Note that these images do not evaluate the ischioanal fossa or perianal space, where an abscess was questioned on the recent CT. MRI recommended to evaluate for perianal abscess.  Original Report Authenticated By: Waneta Martins, Stone.D.     Medications: Scheduled:   . cefTRIAXone (ROCEPHIN)  IV  1 g Intravenous QHS  . cholecalciferol  1,000 Units Oral Daily  . ciprofloxacin  400 mg Intravenous BID  . enoxaparin (LOVENOX) injection  40 mg Subcutaneous Q24H  . folic acid  1 mg Oral Daily  . liver oil-zinc oxide   Topical BID  . multivitamin with minerals  1 tablet Oral Daily  . ondansetron  4 mg Intravenous Once  . vancomycin  2,000 mg Intravenous Q24H   Continuous:   . 0.9 % NaCl with KCl 20 mEq / L 75 mL/hr at 05/23/12 1420     Assessment/Plan: Principal Problem:  *Perirectal abscess Active Problems:  Urinary tract infection  Hypotension  Obesity  Nausea and  vomiting  Hypertension  1. *Perirectal abscess- unable to peform MRI or CT with contrast to evaluate further due to obesity/renal failure. Her clinical picture is improving with decreased pain and improved leukocytosis.  She did spike a fever to 101.4 in last 24 hrs.  fever. Will continue broad spectrum antibiotics. Ultrasound unrevealing but limited. Appreciate surgical input.   2. Urinary tract infection- urine culture pending (was obtained yesterday after being on antibiotics). Current antibiotics should be sufficient.   3. Hypotension/possible sepsis- improved with IVF hydration - although needs less and I will adjust rate. Blood cultures pending (not obtained on admission). Continue IV fluids, IV antibiotics.   4. Acute Renal Failure- improving with hydration consistent with prerenal azotemia vs. Mild ATN due to infection/hypotension. Monitor. ACE inhibitor on hold.  Cr stable at 1.65 currently.  5. Morbid Obesity- limits ability to do MRI. Needs significant weight loss.  6. Nausea and vomiting- improved. Continue Zofran prn.   7. Stage 2 Sacral Decubitus ulcer- continue topical barrier protection.  8. Disposition- anticipate discharge in 2-3 days on oral or IV antibiotics if no surgery needed.   9. HTN - fine off ACEi  10. Anemia - Hbg stable. Following.  11. Asthma - breathing well  12. OA  13. 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  15.  Osteoporosis   16. Chronic pain   17. gait distubance - Cane/walker.  She has been walking and reports she does not need PT.    ID -  Anti-infectives     Start     Dose/Rate Route Frequency Ordered Stop   05/22/12 1000   vancomycin (VANCOCIN) 2,000 mg in sodium chloride 0.9 % 500 mL IVPB        2,000 mg 250 mL/hr over 120 Minutes Intravenous Every 24 hours 05/22/12 0738     05/21/12 2200   cefTRIAXone (ROCEPHIN) 1 g in dextrose 5 % 50 mL IVPB        1 g 100 mL/hr over 30 Minutes  Intravenous Daily at bedtime 05/21/12 0327     05/21/12 1200   vancomycin (VANCOCIN) 1,500 mg in sodium chloride 0.9 % 500 mL IVPB  Status:  Discontinued        1,500 mg 250 mL/hr over 120 Minutes Intravenous Every 12 hours 05/21/12 0402 05/21/12 1516   05/21/12 0430   ciprofloxacin (CIPRO) IVPB 400 mg        400 mg 200 mL/hr over 60 Minutes Intravenous 2 times daily 05/21/12 0401     05/21/12 0045   cefTRIAXone (ROCEPHIN) 1 g in dextrose 5 % 50 mL IVPB        1 g 100 mL/hr over 30 Minutes Intravenous  Once 05/21/12 0032 05/21/12 0244   05/20/12 2300   vancomycin (VANCOCIN) IVPB 1000 mg/200 mL premix        1,000 mg 200 mL/hr over 60 Minutes Intravenous  Once 05/20/12 2248 05/21/12 0134   05/20/12 2300   vancomycin (VANCOCIN) IVPB 1000 mg/200 mL premix        1,000 mg 200 mL/hr over 60 Minutes Intravenous  Once 05/20/12 2248 05/20/12 2353   05/20/12 2230   vancomycin (VANCOCIN) IVPB 1000 mg/200 mL premix  Status:  Discontinued        1,000 mg 200 mL/hr over 60 Minutes Intravenous To Emergency Dept 05/20/12 2147 05/20/12 2208   05/20/12 2230   vancomycin (VANCOCIN) 2,000 mg in sodium chloride 0.9 % 500 mL IVPB  Status:  Discontinued        2,000 mg 250 mL/hr over 120 Minutes Intravenous To Emergency Dept 05/20/12 2208 05/20/12 2248         DVT Prophylaxis    LOS: 4 days   Maria Stone  05/24/2012, 7:01 AM

## 2012-05-25 ENCOUNTER — Encounter (HOSPITAL_COMMUNITY): Payer: Self-pay | Admitting: Anesthesiology

## 2012-05-25 ENCOUNTER — Encounter (HOSPITAL_COMMUNITY)
Admission: EM | Disposition: A | Discharge status: Home / Self Care | Payer: Self-pay | Source: Home / Self Care | Attending: Internal Medicine

## 2012-05-25 ENCOUNTER — Inpatient Hospital Stay (HOSPITAL_COMMUNITY): Payer: 59 | Admitting: Anesthesiology

## 2012-05-25 HISTORY — PX: INCISION AND DRAINAGE PERIRECTAL ABSCESS: SHX1804

## 2012-05-25 LAB — CBC
MCH: 31.6 pg (ref 26.0–34.0)
Platelets: 163 10*3/uL (ref 150–400)
RBC: 3.26 MIL/uL — ABNORMAL LOW (ref 3.87–5.11)

## 2012-05-25 LAB — BASIC METABOLIC PANEL
CO2: 23 mEq/L (ref 19–32)
Calcium: 8.3 mg/dL — ABNORMAL LOW (ref 8.4–10.5)
GFR calc non Af Amer: 28 mL/min — ABNORMAL LOW (ref >90)
Potassium: 4.1 mEq/L (ref 3.5–5.1)
Sodium: 133 mEq/L — ABNORMAL LOW (ref 135–145)

## 2012-05-25 SURGERY — INCISION AND DRAINAGE, ABSCESS, PERIRECTAL
Anesthesia: General | Site: Rectum | Wound class: Contaminated

## 2012-05-25 MED ORDER — ACETAMINOPHEN 10 MG/ML IV SOLN
INTRAVENOUS | Status: DC | PRN
  Administered 2012-05-25: 1000 mg via INTRAVENOUS

## 2012-05-25 MED ORDER — LACTATED RINGERS IV SOLN: INTRAVENOUS | Status: DC

## 2012-05-25 MED ORDER — CIPROFLOXACIN IN D5W 400 MG/200ML IV SOLN
INTRAVENOUS | Status: AC
  Filled 2012-05-25: qty 200

## 2012-05-25 MED ORDER — PROMETHAZINE HCL 25 MG/ML IJ SOLN: 6.2500 mg | INTRAMUSCULAR | Status: DC | PRN

## 2012-05-25 MED ORDER — HEPARIN SODIUM (PORCINE) 5000 UNIT/ML IJ SOLN
5000.0000 [IU] | Freq: Three times a day (TID) | INTRAMUSCULAR | Status: DC
  Administered 2012-05-25 – 2012-05-28 (×9): 5000 [IU] via SUBCUTANEOUS
  Filled 2012-05-25 (×12): qty 1

## 2012-05-25 MED ORDER — PROPOFOL 10 MG/ML IV EMUL
INTRAVENOUS | Status: DC | PRN
  Administered 2012-05-25: 50 mg via INTRAVENOUS
  Administered 2012-05-25: 200 mg via INTRAVENOUS

## 2012-05-25 MED ORDER — VANCOMYCIN HCL 1000 MG IV SOLR
1500.0000 mg | INTRAVENOUS | Status: DC
  Administered 2012-05-26 – 2012-05-27 (×2): 1500 mg via INTRAVENOUS
  Filled 2012-05-25 (×3): qty 1500

## 2012-05-25 MED ORDER — HYDROMORPHONE HCL PF 1 MG/ML IJ SOLN: 0.2500 mg | INTRAMUSCULAR | Status: DC | PRN

## 2012-05-25 MED ORDER — MIDAZOLAM HCL 5 MG/5ML IJ SOLN
INTRAMUSCULAR | Status: DC | PRN
  Administered 2012-05-25 (×2): 1 mg via INTRAVENOUS

## 2012-05-25 MED ORDER — FENTANYL CITRATE 0.05 MG/ML IJ SOLN
INTRAMUSCULAR | Status: DC | PRN
  Administered 2012-05-25 (×3): 50 ug via INTRAVENOUS

## 2012-05-25 MED ORDER — SODIUM CHLORIDE 0.9 % IV SOLN
INTRAVENOUS | Status: DC | PRN
  Administered 2012-05-25: 07:00:00 via INTRAVENOUS

## 2012-05-25 MED ORDER — CHLORHEXIDINE GLUCONATE 0.12 % MT SOLN
OROMUCOSAL | Status: DC | PRN
  Administered 2012-05-25: 30 mL via OROMUCOSAL

## 2012-05-25 MED ORDER — SUCCINYLCHOLINE CHLORIDE 20 MG/ML IJ SOLN
INTRAMUSCULAR | Status: DC | PRN
  Administered 2012-05-25: 140 mg via INTRAVENOUS

## 2012-05-25 MED ORDER — ACETAMINOPHEN 10 MG/ML IV SOLN
INTRAVENOUS | Status: AC
  Filled 2012-05-25: qty 100

## 2012-05-25 MED ORDER — LIDOCAINE HCL (CARDIAC) 20 MG/ML IV SOLN
INTRAVENOUS | Status: DC | PRN
  Administered 2012-05-25: 80 mg via INTRAVENOUS

## 2012-05-25 SURGICAL SUPPLY — 24 items
BANDAGE GAUZE ELAST BULKY 4 IN (GAUZE/BANDAGES/DRESSINGS) ×1 IMPLANT
BLADE SURG SZ20 CARB STEEL (BLADE) ×2 IMPLANT
CLOTH BEACON ORANGE TIMEOUT ST (SAFETY) ×2 IMPLANT
DRAPE LG THREE QUARTER DISP (DRAPES) ×2 IMPLANT
ELECT CAUTERY BLADE 6.4 (BLADE) ×2 IMPLANT
GAUZE PACKING 1/2 X5 YD (GAUZE/BANDAGES/DRESSINGS) ×1 IMPLANT
GAUZE PACKING IODOFORM 1/2 (PACKING) ×2 IMPLANT
GAUZE SPONGE 4X4 16PLY XRAY LF (GAUZE/BANDAGES/DRESSINGS) ×2 IMPLANT
GLOVE BIOGEL M 8.0 STRL (GLOVE) ×2 IMPLANT
GLOVE BIOGEL PI IND STRL 7.0 (GLOVE) ×1 IMPLANT
GLOVE BIOGEL PI INDICATOR 7.0 (GLOVE) ×1
GOWN STRL NON-REIN LRG LVL3 (GOWN DISPOSABLE) ×2 IMPLANT
GOWN STRL REIN XL XLG (GOWN DISPOSABLE) ×4 IMPLANT
HOVERMATT HALF SINGLE USE (PATIENT TRANSFER) ×1 IMPLANT
KIT BASIN OR (CUSTOM PROCEDURE TRAY) ×2 IMPLANT
PACK LITHOTOMY IV (CUSTOM PROCEDURE TRAY) ×2 IMPLANT
PENCIL BUTTON HOLSTER BLD 10FT (ELECTRODE) ×2 IMPLANT
SPONGE GAUZE 4X4 12PLY (GAUZE/BANDAGES/DRESSINGS) ×2 IMPLANT
SYR BULB IRRIGATION 50ML (SYRINGE) ×1 IMPLANT
SYRINGE IRR TOOMEY STRL 70CC (SYRINGE) ×1 IMPLANT
TAPE CLOTH SURG 4X10 WHT LF (GAUZE/BANDAGES/DRESSINGS) ×1 IMPLANT
TOWEL OR 17X26 10 PK STRL BLUE (TOWEL DISPOSABLE) ×2 IMPLANT
TUBE ANAEROBIC SPECIMEN COL (MISCELLANEOUS) ×2 IMPLANT
YANKAUER SUCT BULB TIP 10FT TU (MISCELLANEOUS) ×4 IMPLANT

## 2012-05-25 NOTE — Anesthesia Postprocedure Evaluation (Signed)
Anesthesia Post Note  Patient: Maria Stone  Procedure(s) Performed: Procedure(s) (LRB): IRRIGATION AND DEBRIDEMENT PERIRECTAL ABSCESS (N/A)  Anesthesia type: General  Patient location: PACU  Post pain: Pain level controlled  Post assessment: Post-op Vital signs reviewed  Last Vitals:  Filed Vitals:   05/25/12 0915  BP: 108/44  Pulse: 81  Temp:   Resp: 23    Post vital signs: Reviewed  Level of consciousness: sedated  Complications: No apparent anesthesia complications

## 2012-05-25 NOTE — Anesthesia Preprocedure Evaluation (Addendum)
Anesthesia Evaluation  Patient identified by MRN, date of birth, ID band Patient awake    Reviewed: Allergy & Precautions, H&P , NPO status , Patient's Chart, lab work & pertinent test results  Airway Mallampati: II TM Distance: >3 FB Neck ROM: Full    Dental  (+) Teeth Intact, Caps and Dental Advisory Given   Pulmonary asthma ,  breath sounds clear to auscultation  Pulmonary exam normal       Cardiovascular hypertension, Rhythm:Regular     Neuro/Psych negative neurological ROS  negative psych ROS   GI/Hepatic negative GI ROS, Neg liver ROS,   Endo/Other  Morbid obesity  Renal/GU negative Renal ROS  negative genitourinary   Musculoskeletal negative musculoskeletal ROS (+)   Abdominal   Peds negative pediatric ROS (+)  Hematology negative hematology ROS (+)   Anesthesia Other Findings   Reproductive/Obstetrics negative OB ROS                           Anesthesia Physical Anesthesia Plan  ASA: III  Anesthesia Plan: General   Post-op Pain Management:    Induction: Intravenous  Airway Management Planned: Oral ETT  Additional Equipment:   Intra-op Plan:   Post-operative Plan: Extubation in OR  Informed Consent: I have reviewed the patients History and Physical, chart, labs and discussed the procedure including the risks, benefits and alternatives for the proposed anesthesia with the patient or authorized representative who has indicated his/her understanding and acceptance.   Dental advisory given  Plan Discussed with: CRNA  Anesthesia Plan Comments:         Anesthesia Quick Evaluation

## 2012-05-25 NOTE — Transfer of Care (Signed)
Immediate Anesthesia Transfer of Care Note  Patient: Maria Stone  Procedure(s) Performed: Procedure(s) (LRB): IRRIGATION AND DEBRIDEMENT PERIRECTAL ABSCESS (N/A)  Patient Location: PACU  Anesthesia Type: General  Level of Consciousness: awake, alert , oriented and patient cooperative  Airway & Oxygen Therapy: Patient Spontanous Breathing and Patient connected to face mask oxygen  Post-op Assessment: Report given to PACU RN, Post -op Vital signs reviewed and stable and Patient moving all extremities X 4  Post vital signs: Reviewed and stable  Complications: No apparent anesthesia complications

## 2012-05-25 NOTE — Progress Notes (Signed)
ANTIBIOTIC CONSULT NOTE - Follow-up  Pharmacy Consult for vancomycin Indication: perirectal abscess  Allergies  Allergen Reactions  . Nsaids Anaphylaxis  . Contrast Media (Iodinated Diagnostic Agents) Swelling  . Penicillins Other (See Comments)    Childhood allergy  . Codeine Rash    Patient Measurements: Height: 5\' 1"  (154.9 cm) Weight: 382 lb 4.4 oz (173.4 kg) IBW/kg (Calculated) : 47.8  Adjusted Body Weight: 98kg  Vital Signs: Temp: 98.4 F (36.9 C) (07/16 1418) Temp src: Oral (07/16 1418) BP: 90/57 mmHg (07/16 1418) Pulse Rate: 76  (07/16 1418) Intake/Output from previous day: 07/15 0701 - 07/16 0700 In: 1498.3 [I.V.:1148.3; IV Piggyback:350] Out: -  Intake/Output from this shift: Total I/O In: 1000 [I.V.:1000] Out: 10 [Blood:10]  Labs:  [facility] 05/25/12 0338 05/24/12 0351 05/23/12 0419  WBC 18.6* 15.6* 16.8*  HGB 10.3* 9.7* 9.6*  PLT 163 152 141*  LABCREA -- -- --  CREATININE 1.90* 1.65* 1.65*   Estimated Creatinine Clearance: 50.5 ml/min (by C-G formula based on Cr of 1.9). Normalized CrCl ~43 ml/min   Microbiology: Recent Results (from the past 720 hour(s))  URINE CULTURE     Status: Normal   Collection Time   05/22/12 11:28 AM      Component Value Range Status Comment   Specimen Description URINE, RANDOM   Final    Special Requests NONE   Final    Culture  Setup Time 05/22/2012 21:41   Final    Colony Count NO GROWTH   Final    Culture NO GROWTH   Final    Report Status 05/24/2012 FINAL   Final   CULTURE, BLOOD (ROUTINE X 2)     Status: Normal (Preliminary result)   Collection Time   05/22/12 11:35 AM      Component Value Range Status Comment   Specimen Description BLOOD RIGHT ARM   Final    Special Requests     Final    Value: BOTTLES DRAWN AEROBIC AND ANAEROBIC 6CC BOTH BOTTLES   Culture  Setup Time 05/22/2012 21:40   Final    Culture     Final    Value:        BLOOD CULTURE RECEIVED NO GROWTH TO DATE CULTURE WILL BE HELD FOR 5 DAYS BEFORE  ISSUING A FINAL NEGATIVE REPORT   Report Status PENDING   Incomplete   CULTURE, BLOOD (ROUTINE X 2)     Status: Normal (Preliminary result)   Collection Time   05/22/12 11:41 AM      Component Value Range Status Comment   Specimen Description BLOOD RIGHT ARM   Final    Special Requests     Final    Value: BOTTLES DRAWN AEROBIC AND ANAEROBIC 5CC BOTH BOTTLES   Culture  Setup Time 05/22/2012 21:40   Final    Culture     Final    Value:        BLOOD CULTURE RECEIVED NO GROWTH TO DATE CULTURE WILL BE HELD FOR 5 DAYS BEFORE ISSUING A FINAL NEGATIVE REPORT   Report Status PENDING   Incomplete   SURGICAL PCR SCREEN     Status: Normal   Collection Time   05/24/12  6:40 PM      Component Value Range Status Comment   MRSA, PCR NEGATIVE  NEGATIVE Final    Staphylococcus aureus NEGATIVE  NEGATIVE Final     Medical History: Past Medical History  Diagnosis Date  . Asthma   . Hemolytic anemia   . Morbid  obesity   . Knee pain, bilateral   . Arthritis     Medications:  Anti-infectives     Start     Dose/Rate Route Frequency Ordered Stop   05/22/12 1000   vancomycin (VANCOCIN) 2,000 mg in sodium chloride 0.9 % 500 mL IVPB        2,000 mg 250 mL/hr over 120 Minutes Intravenous Every 24 hours 05/22/12 0738     05/21/12 2200   cefTRIAXone (ROCEPHIN) 1 g in dextrose 5 % 50 mL IVPB        1 g 100 mL/hr over 30 Minutes Intravenous Daily at bedtime 05/21/12 0327     05/21/12 1200   vancomycin (VANCOCIN) 1,500 mg in sodium chloride 0.9 % 500 mL IVPB  Status:  Discontinued        1,500 mg 250 mL/hr over 120 Minutes Intravenous Every 12 hours 05/21/12 0402 05/21/12 1516   05/21/12 0430   ciprofloxacin (CIPRO) IVPB 400 mg        400 mg 200 mL/hr over 60 Minutes Intravenous 2 times daily 05/21/12 0401     05/21/12 0045   cefTRIAXone (ROCEPHIN) 1 g in dextrose 5 % 50 mL IVPB        1 g 100 mL/hr over 30 Minutes Intravenous  Once 05/21/12 0032 05/21/12 0244   05/20/12 2300   vancomycin (VANCOCIN)  IVPB 1000 mg/200 mL premix        1,000 mg 200 mL/hr over 60 Minutes Intravenous  Once 05/20/12 2248 05/21/12 0134   05/20/12 2300   vancomycin (VANCOCIN) IVPB 1000 mg/200 mL premix        1,000 mg 200 mL/hr over 60 Minutes Intravenous  Once 05/20/12 2248 05/20/12 2353   05/20/12 2230   vancomycin (VANCOCIN) IVPB 1000 mg/200 mL premix  Status:  Discontinued        1,000 mg 200 mL/hr over 60 Minutes Intravenous To Emergency Dept 05/20/12 2147 05/20/12 2208   05/20/12 2230   vancomycin (VANCOCIN) 2,000 mg in sodium chloride 0.9 % 500 mL IVPB  Status:  Discontinued        2,000 mg 250 mL/hr over 120 Minutes Intravenous To Emergency Dept 05/20/12 2208 05/20/12 2248         Assessment:  56 yo F started on vanc/ceftriaxone/cipro 7/10 for perirectal abscess.   Patient in acute renal failure, attributed to hypotension with renal hypoperfusion.   Currently on Vancomycin 2g IV q 24 hours.   Vancomycin trough supratherapeutic at 21.8  Scr increased today (1.65 >> 1.9)   Goal of Therapy:  Vancomycin trough level 15-20 mcg/ml  Plan:   Change vancomycin to 1500 mg IV q 24 hours.   Check trough at steady state  Follow renal function closely  Continue ceftriaxone & cipro as ordered per MD   Loralee Pacas, PharmD, BCPS Pager: [phone] 05/25/2012,3:18 PM

## 2012-05-25 NOTE — Progress Notes (Signed)
I called Maria Stone yesterday and discussed pts case. She had low grade temps yesterday and increasing nausea. I was concerned that she had gotten as well as she was going to on Abx and asked them to consider surgical exploration. They did and she is in OR currently. Would appreciate if surgery take her on their service post op with me consulting/following.  Besides todays #s look worse.  Maria follow up later/tomorrow.  Objective: Vital signs in last 24 hours: Temp:  [99.3 F (37.4 C)-101.7 F (38.7 C)] 101.3 F (38.5 C) (07/16 0555) Pulse Rate:  [83-108] 108  (07/16 0555) Resp:  [18-20] 18  (07/16 0555) BP: (107-122)/(62-64) 122/62 mmHg (07/16 0555) SpO2:  [97 %-100 %] 97 % (07/16 0555) Weight change:  Last BM Date: 05/24/12  CBG (last 3)  No results found for this basename: GLUCAP:3 in the last 72 hours  Intake/Output from previous day:  Intake/Output Summary (Last 24 hours) at 05/25/12 0720 Last data filed at 05/25/12 1610  Gross per 24 hour  Intake 1498.33 ml  Output      0 ml  Net 1498.33 ml   07/15 0701 - 07/16 0700 In: 1498.3 [I.V.:1148.3; IV Piggyback:350] Out: -    Lab Results:  Basename 05/25/12 0338 05/24/12 0351  NA 133* 131*  K 4.1 4.2  CL 101 100  CO2 23 23  GLUCOSE 107* 103*  BUN 22 24*  CREATININE 1.90* 1.65*  CALCIUM 8.3* 7.9*  MG -- --  PHOS -- --    No results found for this basename: AST:2,ALT:2,ALKPHOS:2,BILITOT:2,PROT:2,ALBUMIN:2 in the last 72 hours   Basename 05/25/12 0338 05/24/12 0351  WBC 18.6* 15.6*  NEUTROABS -- --  HGB 10.3* 9.7*  HCT 30.6* 29.9*  MCV 93.9 94.3  PLT 163 152    Lab Results  Component Value Date   INR 1.1 03/06/2008    No results found for this basename: CKTOTAL:3,CKMB:3,CKMBINDEX:3,TROPONINI:3 in the last 72 hours  No results found for this basename: TSH,T4TOTAL,FREET3,T3FREE,THYROIDAB in the last 72 hours  No results found for this basename:  VITAMINB12:2,FOLATE:2,FERRITIN:2,TIBC:2,IRON:2,RETICCTPCT:2 in the last 72 hours  Micro Results: Recent Results (from the past 240 hour(s))  URINE CULTURE     Status: Normal   Collection Time   05/22/12 11:28 AM      Component Value Range Status Comment   Specimen Description URINE, RANDOM   Final    Special Requests NONE   Final    Culture  Setup Time 05/22/2012 21:41   Final    Colony Count NO GROWTH   Final    Culture NO GROWTH   Final    Report Status 05/24/2012 FINAL   Final   CULTURE, BLOOD (ROUTINE X 2)     Status: Normal (Preliminary result)   Collection Time   05/22/12 11:35 AM      Component Value Range Status Comment   Specimen Description BLOOD RIGHT ARM   Final    Special Requests     Final    Value: BOTTLES DRAWN AEROBIC AND ANAEROBIC 6CC BOTH BOTTLES   Culture  Setup Time 05/22/2012 21:40   Final    Culture     Final    Value:        BLOOD CULTURE RECEIVED NO GROWTH TO DATE CULTURE Maria BE HELD FOR 5 DAYS BEFORE ISSUING A FINAL NEGATIVE REPORT   Report Status PENDING   Incomplete   CULTURE, BLOOD (ROUTINE X 2)     Status: Normal (Preliminary result)   Collection  Time   05/22/12 11:41 AM      Component Value Range Status Comment   Specimen Description BLOOD RIGHT ARM   Final    Special Requests     Final    Value: BOTTLES DRAWN AEROBIC AND ANAEROBIC 5CC BOTH BOTTLES   Culture  Setup Time 05/22/2012 21:40   Final    Culture     Final    Value:        BLOOD CULTURE RECEIVED NO GROWTH TO DATE CULTURE Maria BE HELD FOR 5 DAYS BEFORE ISSUING A FINAL NEGATIVE REPORT   Report Status PENDING   Incomplete   SURGICAL PCR SCREEN     Status: Normal   Collection Time   05/24/12  6:40 PM      Component Value Range Status Comment   MRSA, PCR NEGATIVE  NEGATIVE Final    Staphylococcus aureus NEGATIVE  NEGATIVE Final      Studies/Results: No results found.   Medications: Scheduled:   . cefTRIAXone (ROCEPHIN)  IV  1 g Intravenous QHS  . cholecalciferol  1,000 Units Oral  Daily  . ciprofloxacin  400 mg Intravenous BID  . enoxaparin (LOVENOX) injection  40 mg Subcutaneous Q24H  . folic acid  1 mg Oral Daily  . liver oil-zinc oxide   Topical BID  . multivitamin with minerals  1 tablet Oral Daily  . ondansetron  4 mg Intravenous Once  . vancomycin  2,000 mg Intravenous Q24H   Continuous:   . 0.9 % NaCl with KCl 20 mEq / L 40 mL/hr at 05/24/12 0838      LOS: 5 days   Yousuf Ager M 05/25/2012, 7:20 AM

## 2012-05-25 NOTE — Preoperative (Signed)
Beta Blockers   Reason not to administer Beta Blockers:Not Applicable 

## 2012-05-25 NOTE — Op Note (Signed)
Surgeon: Wenda Low, MD, FACS  Asst:  none  Anes:  general  Procedure: Incision and drainage of left perirectal abscess-with 1/2 inch iodophor gauze placement  Diagnosis: Perirectal abscess  Complications: none  EBL:   8 cc  Description of Procedure:  Taken to room 6 and placed in stirrups.  Morbidly obese lady was padded and prepped with pcmx and draped.  Timeout.  Pus aspirated with 18 gauge needle and sent for culture.  Radial cut on the left and digital probing of large abscess cavity.  Irrigated with H202 and packed with 0.5 inch iodophor gauze.  To PACU in stable condition.  Matt B. Daphine Deutscher, MD, [facility], [place] [phone]

## 2012-05-26 ENCOUNTER — Encounter (HOSPITAL_COMMUNITY): Payer: Self-pay | Admitting: Surgery

## 2012-05-26 LAB — CBC
Hemoglobin: 9.4 g/dL — ABNORMAL LOW (ref 12.0–15.0)
Platelets: 193 10*3/uL (ref 150–400)
RBC: 3.09 MIL/uL — ABNORMAL LOW (ref 3.87–5.11)
WBC: 15 10*3/uL — ABNORMAL HIGH (ref 4.0–10.5)

## 2012-05-26 LAB — BASIC METABOLIC PANEL
CO2: 22 mEq/L (ref 19–32)
Chloride: 104 mEq/L (ref 96–112)
Glucose, Bld: 98 mg/dL (ref 70–99)
Potassium: 4.3 mEq/L (ref 3.5–5.1)
Sodium: 133 mEq/L — ABNORMAL LOW (ref 135–145)

## 2012-05-26 NOTE — Progress Notes (Signed)
Subjective: POD #1 Discussed plan with Dr Daphine Deutscher. She is to remain on IV ABx, start moving more, get Packing removed, and await Cxs. More edematous - Diuretics on hold with ARF. S/P BM this am and already cleaned up.  No New C/O  Objective: Vital signs in last 24 hours: Temp:  [98.4 F (36.9 C)-99.7 F (37.6 C)] 99.4 F (37.4 C) (07/17 0524) Pulse Rate:  [76-104] 90  (07/17 0524) Resp:  [15-23] 16  (07/17 0524) BP: (90-132)/(42-70) 102/58 mmHg (07/17 0524) SpO2:  [93 %-100 %] 95 % (07/17 0524) Weight change:  Last BM Date: 05/25/12  CBG (last 3)  No results found for this basename: GLUCAP:3 in the last 72 hours  Intake/Output from previous day:  Intake/Output Summary (Last 24 hours) at 05/26/12 0705 Last data filed at 05/25/12 0915  Gross per 24 hour  Intake   1000 ml  Output     10 ml  Net    990 ml   07/16 0701 - 07/17 0700 In: 1000 [I.V.:1000] Out: 10 [Blood:10]   Physical Exam  General appearance:  A and O, no distress  L Buttock: -S/P I And D.Marland KitchenMarland KitchenPacked.  No odor Resp: CTA Cardio: Reg Ab - morbidly obese. Extremities: Thick. Some edema.  Lab Results:  Basename 05/26/12 0330 05/25/12 0338  NA 133* 133*  K 4.3 4.1  CL 104 101  CO2 22 23  GLUCOSE 98 107*  BUN 21 22  CREATININE 2.09* 1.90*  CALCIUM 8.5 8.3*  MG -- --  PHOS -- --    No results found for this basename: AST:2,ALT:2,ALKPHOS:2,BILITOT:2,PROT:2,ALBUMIN:2 in the last 72 hours   Basename 05/26/12 0330 05/25/12 0338  WBC 15.0* 18.6*  NEUTROABS -- --  HGB 9.4* 10.3*  HCT 29.2* 30.6*  MCV 94.5 93.9  PLT 193 163    Lab Results  Component Value Date   INR 1.1 03/06/2008    No results found for this basename: CKTOTAL:3,CKMB:3,CKMBINDEX:3,TROPONINI:3 in the last 72 hours  No results found for this basename: TSH,T4TOTAL,FREET3,T3FREE,THYROIDAB in the last 72 hours  No results found for this basename: VITAMINB12:2,FOLATE:2,FERRITIN:2,TIBC:2,IRON:2,RETICCTPCT:2 in the last 72  hours  Micro Results: Recent Results (from the past 240 hour(s))  URINE CULTURE     Status: Normal   Collection Time   05/22/12 11:28 AM      Component Value Range Status Comment   Specimen Description URINE, RANDOM   Final    Special Requests NONE   Final    Culture  Setup Time 05/22/2012 21:41   Final    Colony Count NO GROWTH   Final    Culture NO GROWTH   Final    Report Status 05/24/2012 FINAL   Final   CULTURE, BLOOD (ROUTINE X 2)     Status: Normal (Preliminary result)   Collection Time   05/22/12 11:35 AM      Component Value Range Status Comment   Specimen Description BLOOD RIGHT ARM   Final    Special Requests     Final    Value: BOTTLES DRAWN AEROBIC AND ANAEROBIC 6CC BOTH BOTTLES   Culture  Setup Time 05/22/2012 21:40   Final    Culture     Final    Value:        BLOOD CULTURE RECEIVED NO GROWTH TO DATE CULTURE WILL BE HELD FOR 5 DAYS BEFORE ISSUING A FINAL NEGATIVE REPORT   Report Status PENDING   Incomplete   CULTURE, BLOOD (ROUTINE X 2)     Status:  Normal (Preliminary result)   Collection Time   05/22/12 11:41 AM      Component Value Range Status Comment   Specimen Description BLOOD RIGHT ARM   Final    Special Requests     Final    Value: BOTTLES DRAWN AEROBIC AND ANAEROBIC 5CC BOTH BOTTLES   Culture  Setup Time 05/22/2012 21:40   Final    Culture     Final    Value:        BLOOD CULTURE RECEIVED NO GROWTH TO DATE CULTURE WILL BE HELD FOR 5 DAYS BEFORE ISSUING A FINAL NEGATIVE REPORT   Report Status PENDING   Incomplete   SURGICAL PCR SCREEN     Status: Normal   Collection Time   05/24/12  6:40 PM      Component Value Range Status Comment   MRSA, PCR NEGATIVE  NEGATIVE Final    Staphylococcus aureus NEGATIVE  NEGATIVE Final   CULTURE, ROUTINE-ABSCESS     Status: Normal (Preliminary result)   Collection Time   05/25/12  8:07 AM      Component Value Range Status Comment   Specimen Description ABSCESS PERIRECTAL   Final    Special Requests NONE   Final    Gram  Stain     Final    Value: NO WBC SEEN     NO SQUAMOUS EPITHELIAL CELLS SEEN     MODERATE GRAM POSITIVE COCCI IN PAIRS   Culture PENDING   Incomplete    Report Status PENDING   Incomplete   ANAEROBIC CULTURE     Status: Normal (Preliminary result)   Collection Time   05/25/12  8:07 AM      Component Value Range Status Comment   Specimen Description ABSCESS PERIRECTAL   Final    Special Requests NONE   Final    Gram Stain     Final    Value: RARE WBC PRESENT,BOTH PMN AND MONONUCLEAR     NO SQUAMOUS EPITHELIAL CELLS SEEN     NO ORGANISMS SEEN   Culture PENDING   Incomplete    Report Status PENDING   Incomplete      Studies/Results: No results found.   Medications: Scheduled:    . cefTRIAXone (ROCEPHIN)  IV  1 g Intravenous QHS  . cholecalciferol  1,000 Units Oral Daily  . ciprofloxacin  400 mg Intravenous BID  . folic acid  1 mg Oral Daily  . heparin  5,000 Units Subcutaneous Q8H  . liver oil-zinc oxide   Topical BID  . multivitamin with minerals  1 tablet Oral Daily  . ondansetron  4 mg Intravenous Once  . vancomycin  1,500 mg Intravenous Q24H  . DISCONTD: enoxaparin (LOVENOX) injection  40 mg Subcutaneous Q24H  . DISCONTD: vancomycin  2,000 mg Intravenous Q24H   Continuous:    . 0.9 % NaCl with KCl 20 mEq / L 40 mL/hr at 05/24/12 0838  . DISCONTD: lactated ringers       Assessment/Plan: Principal Problem:  *Perirectal abscess Active Problems:  Urinary tract infection  Hypotension  Obesity  Nausea and vomiting  Hypertension  1. *Perirectal abscess- S/P I & D. Will continue broad spectrum antibiotics. Await Cxs.  Local care. Would appreciate Transfer to General surgery service.  2. Urinary tract infection- urine culture negative. Current antibiotics should be sufficient.   3. Hypotension - better but on low side.  Monitor.  4. Acute Renal Failure- improved and then worsened.  Now > 2 again.  Continue  ow dose IVF.  Diuretics on hold, BP fine. Mild ATN due to  infection/hypotension. Monitor. ACE inhibitor on hold.  Cr 1.9 - 2.07.  5. Morbid Obesity-  Needs significant weight loss. Discussed possible future bariatric surgery with Dr Daphine Deutscher.  6. Nausea and vomiting-  Zofran prn.   7. Stage 2 Sacral Decubitus ulcer- continue topical barrier protection.  8. Disposition- anticipate discharge when better.  9. HTN - fine off ACEi/diuretics.  10. Anemia - Hbg stable. Following.10.3 - 9.4 post surgery...follow  11. Asthma - breathing well  12. OA  13. 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  15.  Osteoporosis   16. Chronic pain   17. gait distubance - Cane/walker.  She has been walking and reports she does not need PT. offered again    ID -  Anti-infectives     Start     Dose/Rate Route Frequency Ordered Stop   05/26/12 1400   vancomycin (VANCOCIN) 1,500 mg in sodium chloride 0.9 % 500 mL IVPB        1,500 mg 250 mL/hr over 120 Minutes Intravenous Every 24 hours 05/25/12 1535     05/22/12 1000   vancomycin (VANCOCIN) 2,000 mg in sodium chloride 0.9 % 500 mL IVPB  Status:  Discontinued        2,000 mg 250 mL/hr over 120 Minutes Intravenous Every 24 hours 05/22/12 0738 05/25/12 1535   05/21/12 2200   cefTRIAXone (ROCEPHIN) 1 g in dextrose 5 % 50 mL IVPB        1 g 100 mL/hr over 30 Minutes Intravenous Daily at bedtime 05/21/12 0327     05/21/12 1200   vancomycin (VANCOCIN) 1,500 mg in sodium chloride 0.9 % 500 mL IVPB  Status:  Discontinued        1,500 mg 250 mL/hr over 120 Minutes Intravenous Every 12 hours 05/21/12 0402 05/21/12 1516   05/21/12 0430   ciprofloxacin (CIPRO) IVPB 400 mg        400 mg 200 mL/hr over 60 Minutes Intravenous 2 times daily 05/21/12 0401     05/21/12 0045   cefTRIAXone (ROCEPHIN) 1 g in dextrose 5 % 50 mL IVPB        1 g 100 mL/hr over 30 Minutes Intravenous  Once 05/21/12 0032 05/21/12 0244   05/20/12 2300   vancomycin (VANCOCIN) IVPB 1000 mg/200 mL premix          1,000 mg 200 mL/hr over 60 Minutes Intravenous  Once 05/20/12 2248 05/21/12 0134   05/20/12 2300   vancomycin (VANCOCIN) IVPB 1000 mg/200 mL premix        1,000 mg 200 mL/hr over 60 Minutes Intravenous  Once 05/20/12 2248 05/20/12 2353   05/20/12 2230   vancomycin (VANCOCIN) IVPB 1000 mg/200 mL premix  Status:  Discontinued        1,000 mg 200 mL/hr over 60 Minutes Intravenous To Emergency Dept 05/20/12 2147 05/20/12 2208   05/20/12 2230   vancomycin (VANCOCIN) 2,000 mg in sodium chloride 0.9 % 500 mL IVPB  Status:  Discontinued        2,000 mg 250 mL/hr over 120 Minutes Intravenous To Emergency Dept 05/20/12 2208 05/20/12 2248         DVT Prophylaxis    LOS: 6 days   Larron Armor M 05/26/2012, 7:05 AM

## 2012-05-26 NOTE — Progress Notes (Signed)
Patient reported an increase in swelling in b/l feet.  Per pt she has not taken her lasix or hctz since admission and is curious as to if these medications will be re-started.

## 2012-05-26 NOTE — Progress Notes (Signed)
Patient called out for pain medication.  When pulling morphine out of the pyxis I confused ordered dose to be 1 mg and had [employer] 1 mg in the pyxis.  Mar double checked prior to waste and order dose noted to be 2 mg.  Patient did receive 2 mg.  Pharmacy contacted, they suggested witting progress note because there is no way to pull back a waste in the pyxis.

## 2012-05-26 NOTE — Progress Notes (Signed)
1 Day Post-Op  Subjective: Doing well today, no c/o of discomfort.   Objective: Vital signs in last 24 hours: Temp:  [98.4 F (36.9 C)-99.7 F (37.6 C)] 99.4 F (37.4 C) (07/17 0524) Pulse Rate:  [76-98] 90  (07/17 0524) Resp:  [15-19] 16  (07/17 0524) BP: (90-123)/(43-70) 102/58 mmHg (07/17 0524) SpO2:  [93 %-98 %] 95 % (07/17 0524) Last BM Date: 05/25/12  Intake/Output from previous day: 07/16 0701 - 07/17 0700 In: 1000 [I.V.:1000] Out: 10 [Blood:10] Intake/Output this shift: Total I/O In: -  Out: 1 [Stool:1]  General appearance: alert, cooperative, appears stated age, no distress and morbidly obese Chest: CTA Wound: packing removed, no drainage seen, no odor, small amount of excoriationa and erythema on buttocks. WBC slowly improving, she remains afebrile.  Lab Results:   Basename 05/26/12 0330 05/25/12 0338  WBC 15.0* 18.6*  HGB 9.4* 10.3*  HCT 29.2* 30.6*  PLT 193 163   BMET  Basename 05/26/12 0330 05/25/12 0338  NA 133* 133*  K 4.3 4.1  CL 104 101  CO2 22 23  GLUCOSE 98 107*  BUN 21 22  CREATININE 2.09* 1.90*  CALCIUM 8.5 8.3*   PT/INR No results found for this basename: LABPROT:2,INR:2 in the last 72 hours ABG No results found for this basename: PHART:2,PCO2:2,PO2:2,HCO3:2 in the last 72 hours  Studies/Results: No results found.  Anti-infectives: Anti-infectives     Start     Dose/Rate Route Frequency Ordered Stop   05/26/12 1400   vancomycin (VANCOCIN) 1,500 mg in sodium chloride 0.9 % 500 mL IVPB        1,500 mg 250 mL/hr over 120 Minutes Intravenous Every 24 hours 05/25/12 1535     05/22/12 1000   vancomycin (VANCOCIN) 2,000 mg in sodium chloride 0.9 % 500 mL IVPB  Status:  Discontinued        2,000 mg 250 mL/hr over 120 Minutes Intravenous Every 24 hours 05/22/12 0738 05/25/12 1535   05/21/12 2200   cefTRIAXone (ROCEPHIN) 1 g in dextrose 5 % 50 mL IVPB        1 g 100 mL/hr over 30 Minutes Intravenous Daily at bedtime 05/21/12 0327     05/21/12 1200   vancomycin (VANCOCIN) 1,500 mg in sodium chloride 0.9 % 500 mL IVPB  Status:  Discontinued        1,500 mg 250 mL/hr over 120 Minutes Intravenous Every 12 hours 05/21/12 0402 05/21/12 1516   05/21/12 0430   ciprofloxacin (CIPRO) IVPB 400 mg        400 mg 200 mL/hr over 60 Minutes Intravenous 2 times daily 05/21/12 0401     05/21/12 0045   cefTRIAXone (ROCEPHIN) 1 g in dextrose 5 % 50 mL IVPB        1 g 100 mL/hr over 30 Minutes Intravenous  Once 05/21/12 0032 05/21/12 0244   05/20/12 2300   vancomycin (VANCOCIN) IVPB 1000 mg/200 mL premix        1,000 mg 200 mL/hr over 60 Minutes Intravenous  Once 05/20/12 2248 05/21/12 0134   05/20/12 2300   vancomycin (VANCOCIN) IVPB 1000 mg/200 mL premix        1,000 mg 200 mL/hr over 60 Minutes Intravenous  Once 05/20/12 2248 05/20/12 2353   05/20/12 2230   vancomycin (VANCOCIN) IVPB 1000 mg/200 mL premix  Status:  Discontinued        1,000 mg 200 mL/hr over 60 Minutes Intravenous To Emergency Dept 05/20/12 2147 05/20/12 2208   05/20/12 2230  vancomycin (VANCOCIN) 2,000 mg in sodium chloride 0.9 % 500 mL IVPB  Status:  Discontinued        2,000 mg 250 mL/hr over 120 Minutes Intravenous To Emergency Dept 05/20/12 2208 05/20/12 2248          Assessment/Plan: s/p Procedure(s) (LRB): IRRIGATION AND DEBRIDEMENT PERIRECTAL ABSCESS (N/A) 1. Removed wound packing, continue with abx. 2. Medical management per medicine.  LOS: 6 days    Maria Stone 05/26/2012

## 2012-05-27 LAB — CBC
Hemoglobin: 9.4 g/dL — ABNORMAL LOW (ref 12.0–15.0)
MCH: 30.7 pg (ref 26.0–34.0)
MCV: 95.1 fL (ref 78.0–100.0)
RBC: 3.06 MIL/uL — ABNORMAL LOW (ref 3.87–5.11)
WBC: 12.7 10*3/uL — ABNORMAL HIGH (ref 4.0–10.5)

## 2012-05-27 LAB — BASIC METABOLIC PANEL
CO2: 24 mEq/L (ref 19–32)
Calcium: 8.5 mg/dL (ref 8.4–10.5)
Chloride: 105 mEq/L (ref 96–112)
Creatinine, Ser: 2.12 mg/dL — ABNORMAL HIGH (ref 0.50–1.10)
Glucose, Bld: 95 mg/dL (ref 70–99)

## 2012-05-27 MED ORDER — HYDROCODONE-ACETAMINOPHEN 5-325 MG PO TABS
1.0000 | ORAL_TABLET | ORAL | Status: DC | PRN
  Administered 2012-05-27 – 2012-05-28 (×5): 2 via ORAL
  Filled 2012-05-27 (×5): qty 2

## 2012-05-27 NOTE — Progress Notes (Signed)
Subjective: POD #2 Packing removed yesterday Await Cxs. On IV Abx still. More LE edema. - Diuretics on hold with ARF. Still with Temps to 100.1  No New C/O  Still on liquids - I will advance.  Objective: Vital signs in last 24 hours: Temp:  [98.1 F (36.7 C)-100.1 F (37.8 C)] 98.1 F (36.7 C) (07/18 0510) Pulse Rate:  [80-94] 94  (07/18 0510) Resp:  [18] 18  (07/18 0510) BP: (99-127)/(63-69) 127/69 mmHg (07/18 0510) SpO2:  [94 %-99 %] 94 % (07/18 0510) Weight change:  Last BM Date: 05/26/12  CBG (last 3)  No results found for this basename: GLUCAP:3 in the last 72 hours  Intake/Output from previous day:  Intake/Output Summary (Last 24 hours) at 05/27/12 0736 Last data filed at 05/27/12 0510  Gross per 24 hour  Intake    607 ml  Output      3 ml  Net    604 ml   07/17 0701 - 07/18 0700 In: 607 [P.O.:120; I.V.:487] Out: 4 [Stool:4]   Physical Exam  General appearance:  A and O, no distress  L Buttock: -S/P I And D covered  - no obvious surrounding cellulitis. Resp: CTA Cardio: Reg Ab - morbidly obese. Extremities: Thick. Some edema.  Lab Results:  Basename 05/27/12 0347 05/26/12 0330  NA 136 133*  K 4.3 4.3  CL 105 104  CO2 24 22  GLUCOSE 95 98  BUN 19 21  CREATININE 2.12* 2.09*  CALCIUM 8.5 8.5  MG -- --  PHOS -- --    No results found for this basename: AST:2,ALT:2,ALKPHOS:2,BILITOT:2,PROT:2,ALBUMIN:2 in the last 72 hours   Basename 05/27/12 0347 05/26/12 0330  WBC 12.7* 15.0*  NEUTROABS -- --  HGB 9.4* 9.4*  HCT 29.1* 29.2*  MCV 95.1 94.5  PLT 220 193    Lab Results  Component Value Date   INR 1.1 03/06/2008    No results found for this basename: CKTOTAL:3,CKMB:3,CKMBINDEX:3,TROPONINI:3 in the last 72 hours  No results found for this basename: TSH,T4TOTAL,FREET3,T3FREE,THYROIDAB in the last 72 hours  No results found for this basename: VITAMINB12:2,FOLATE:2,FERRITIN:2,TIBC:2,IRON:2,RETICCTPCT:2 in the last 72 hours  Micro  Results: Recent Results (from the past 240 hour(s))  URINE CULTURE     Status: Normal   Collection Time   05/22/12 11:28 AM      Component Value Range Status Comment   Specimen Description URINE, RANDOM   Final    Special Requests NONE   Final    Culture  Setup Time 05/22/2012 21:41   Final    Colony Count NO GROWTH   Final    Culture NO GROWTH   Final    Report Status 05/24/2012 FINAL   Final   CULTURE, BLOOD (ROUTINE X 2)     Status: Normal (Preliminary result)   Collection Time   05/22/12 11:35 AM      Component Value Range Status Comment   Specimen Description BLOOD RIGHT ARM   Final    Special Requests     Final    Value: BOTTLES DRAWN AEROBIC AND ANAEROBIC 6CC BOTH BOTTLES   Culture  Setup Time 05/22/2012 21:40   Final    Culture     Final    Value:        BLOOD CULTURE RECEIVED NO GROWTH TO DATE CULTURE WILL BE HELD FOR 5 DAYS BEFORE ISSUING A FINAL NEGATIVE REPORT   Report Status PENDING   Incomplete   CULTURE, BLOOD (ROUTINE X 2)  Status: Normal (Preliminary result)   Collection Time   05/22/12 11:41 AM      Component Value Range Status Comment   Specimen Description BLOOD RIGHT ARM   Final    Special Requests     Final    Value: BOTTLES DRAWN AEROBIC AND ANAEROBIC 5CC BOTH BOTTLES   Culture  Setup Time 05/22/2012 21:40   Final    Culture     Final    Value:        BLOOD CULTURE RECEIVED NO GROWTH TO DATE CULTURE WILL BE HELD FOR 5 DAYS BEFORE ISSUING A FINAL NEGATIVE REPORT   Report Status PENDING   Incomplete   SURGICAL PCR SCREEN     Status: Normal   Collection Time   05/24/12  6:40 PM      Component Value Range Status Comment   MRSA, PCR NEGATIVE  NEGATIVE Final    Staphylococcus aureus NEGATIVE  NEGATIVE Final   CULTURE, ROUTINE-ABSCESS     Status: Normal (Preliminary result)   Collection Time   05/25/12  8:07 AM      Component Value Range Status Comment   Specimen Description ABSCESS PERIRECTAL   Final    Special Requests NONE   Final    Gram Stain     Final     Value: NO WBC SEEN     NO SQUAMOUS EPITHELIAL CELLS SEEN     MODERATE GRAM POSITIVE COCCI IN PAIRS   Culture NO GROWTH 1 DAY   Final    Report Status PENDING   Incomplete   ANAEROBIC CULTURE     Status: Normal (Preliminary result)   Collection Time   05/25/12  8:07 AM      Component Value Range Status Comment   Specimen Description ABSCESS PERIRECTAL   Final    Special Requests NONE   Final    Gram Stain     Final    Value: RARE WBC PRESENT,BOTH PMN AND MONONUCLEAR     NO SQUAMOUS EPITHELIAL CELLS SEEN     NO ORGANISMS SEEN   Culture     Final    Value: NO ANAEROBES ISOLATED; CULTURE IN PROGRESS FOR 5 DAYS   Report Status PENDING   Incomplete      Studies/Results: No results found.   Medications: Scheduled:    . cefTRIAXone (ROCEPHIN)  IV  1 g Intravenous QHS  . cholecalciferol  1,000 Units Oral Daily  . ciprofloxacin  400 mg Intravenous BID  . folic acid  1 mg Oral Daily  . heparin  5,000 Units Subcutaneous Q8H  . liver oil-zinc oxide   Topical BID  . multivitamin with minerals  1 tablet Oral Daily  . ondansetron  4 mg Intravenous Once  . vancomycin  1,500 mg Intravenous Q24H   Continuous:    . 0.9 % NaCl with KCl 20 mEq / L 40 mL/hr at 05/26/12 0732     Assessment/Plan: Principal Problem:  *Perirectal abscess Active Problems:  Urinary tract infection  Hypotension  Obesity  Nausea and vomiting  Hypertension   Goal over next 24 hrs is to change Abx from 3 IVs to one oral, stop IVF, get her moving better, eating well, and looking for D/c on Fri-Sat.  Will keep IVF with how often she is accessed for the Abx.  Advance Diet.  As long as her Cr is stabilized we can follow Cr as outpt over time and hopefully she returns to baseline quickly.    1. *Perirectal abscess-  S/P I & D. Will continue broad spectrum antibiotics. Await Cxs.  Local care. Would appreciate Transfer to General surgery service.  Leukocytosis down to 12.7 and expect further improvement.  2.  Urinary tract infection- urine culture negative. Current antibiotics should be sufficient.  She has no Sxs   3. Hypotension - BPs doing better.  4. Acute Renal Failure- improved and then worsened.  Now > 2 again.  Continue low dose IVF.  Diuretics on hold, BP fine. Mild ATN due to infection/hypotension. Monitor. ACE inhibitor on hold.  Cr 1.9 - 2.12.  5. Morbid Obesity-  Needs significant weight loss. Discussed possible future bariatric surgery with Dr Daphine Deutscher.  6. Nausea and vomiting-  Zofran prn.   7. Stage 2 Sacral Decubitus ulcer- continue topical barrier protection.  8. Disposition- anticipate discharge when better.  9. HTN - fine off ACEi/diuretics.  10. Anemia - Hbg stable. Following.10.3 - 9.4 post surgery...follow  11. Asthma - breathing well  12. OA  13. 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  15.  Osteoporosis   16. Chronic pain   17. gait distubance - Cane/walker.  She has been walking and reports she does not need PT. offered again    ID -  Anti-infectives     Start     Dose/Rate Route Frequency Ordered Stop   05/26/12 1400   vancomycin (VANCOCIN) 1,500 mg in sodium chloride 0.9 % 500 mL IVPB        1,500 mg 250 mL/hr over 120 Minutes Intravenous Every 24 hours 05/25/12 1535     05/22/12 1000   vancomycin (VANCOCIN) 2,000 mg in sodium chloride 0.9 % 500 mL IVPB  Status:  Discontinued        2,000 mg 250 mL/hr over 120 Minutes Intravenous Every 24 hours 05/22/12 0738 05/25/12 1535   05/21/12 2200   cefTRIAXone (ROCEPHIN) 1 g in dextrose 5 % 50 mL IVPB        1 g 100 mL/hr over 30 Minutes Intravenous Daily at bedtime 05/21/12 0327     05/21/12 1200   vancomycin (VANCOCIN) 1,500 mg in sodium chloride 0.9 % 500 mL IVPB  Status:  Discontinued        1,500 mg 250 mL/hr over 120 Minutes Intravenous Every 12 hours 05/21/12 0402 05/21/12 1516   05/21/12 0430   ciprofloxacin (CIPRO) IVPB 400 mg        400 mg 200 mL/hr  over 60 Minutes Intravenous 2 times daily 05/21/12 0401     05/21/12 0045   cefTRIAXone (ROCEPHIN) 1 g in dextrose 5 % 50 mL IVPB        1 g 100 mL/hr over 30 Minutes Intravenous  Once 05/21/12 0032 05/21/12 0244   05/20/12 2300   vancomycin (VANCOCIN) IVPB 1000 mg/200 mL premix        1,000 mg 200 mL/hr over 60 Minutes Intravenous  Once 05/20/12 2248 05/21/12 0134   05/20/12 2300   vancomycin (VANCOCIN) IVPB 1000 mg/200 mL premix        1,000 mg 200 mL/hr over 60 Minutes Intravenous  Once 05/20/12 2248 05/20/12 2353   05/20/12 2230   vancomycin (VANCOCIN) IVPB 1000 mg/200 mL premix  Status:  Discontinued        1,000 mg 200 mL/hr over 60 Minutes Intravenous To Emergency Dept 05/20/12 2147 05/20/12 2208   05/20/12 2230   vancomycin (VANCOCIN) 2,000 mg in sodium chloride 0.9 % 500 mL IVPB  Status:  Discontinued        2,000 mg 250 mL/hr over 120 Minutes Intravenous To Emergency Dept 05/20/12 2208 05/20/12 2248         DVT Prophylaxis    LOS: 7 days   Haiden Clucas M 05/27/2012, 7:36 AM

## 2012-05-27 NOTE — Progress Notes (Signed)
Patient ID: Maria Stone, female   DOB: 10/16/1956, 56 y.o.   MRN: [id number] 2 Days Post-Op  Subjective: Doing well today, no c/o of discomfort.   Objective: Vital signs in last 24 hours: Temp:  [98.1 F (36.7 C)-100.1 F (37.8 C)] 98.1 F (36.7 C) (07/18 0510) Pulse Rate:  [80-94] 94  (07/18 0510) Resp:  [18] 18  (07/18 0510) BP: (99-127)/(63-69) 127/69 mmHg (07/18 0510) SpO2:  [94 %-99 %] 94 % (07/18 0510) Last BM Date: 05/26/12  Intake/Output from previous day: 07/17 0701 - 07/18 0700 In: 607 [P.O.:120; I.V.:487] Out: 4 [Stool:4] Intake/Output this shift:    General appearance: alert, cooperative, appears stated age, no distress and morbidly obese Chest: CTA Wound: no drainage seen, no odor, small amount of excoriation and erythema on buttocks. WBC continues to improve, she remains afebrile.  Lab Results:   Basename 05/27/12 0347 05/26/12 0330  WBC 12.7* 15.0*  HGB 9.4* 9.4*  HCT 29.1* 29.2*  PLT 220 193   BMET  Basename 05/27/12 0347 05/26/12 0330  NA 136 133*  K 4.3 4.3  CL 105 104  CO2 24 22  GLUCOSE 95 98  BUN 19 21  CREATININE 2.12* 2.09*  CALCIUM 8.5 8.5   PT/INR No results found for this basename: LABPROT:2,INR:2 in the last 72 hours ABG No results found for this basename: PHART:2,PCO2:2,PO2:2,HCO3:2 in the last 72 hours  Studies/Results: No results found.  Anti-infectives: Anti-infectives     Start     Dose/Rate Route Frequency Ordered Stop   05/26/12 1400   vancomycin (VANCOCIN) 1,500 mg in sodium chloride 0.9 % 500 mL IVPB        1,500 mg 250 mL/hr over 120 Minutes Intravenous Every 24 hours 05/25/12 1535     05/22/12 1000   vancomycin (VANCOCIN) 2,000 mg in sodium chloride 0.9 % 500 mL IVPB  Status:  Discontinued        2,000 mg 250 mL/hr over 120 Minutes Intravenous Every 24 hours 05/22/12 0738 05/25/12 1535   05/21/12 2200   cefTRIAXone (ROCEPHIN) 1 g in dextrose 5 % 50 mL IVPB        1 g 100 mL/hr over 30 Minutes Intravenous  Daily at bedtime 05/21/12 0327     05/21/12 1200   vancomycin (VANCOCIN) 1,500 mg in sodium chloride 0.9 % 500 mL IVPB  Status:  Discontinued        1,500 mg 250 mL/hr over 120 Minutes Intravenous Every 12 hours 05/21/12 0402 05/21/12 1516   05/21/12 0430   ciprofloxacin (CIPRO) IVPB 400 mg        400 mg 200 mL/hr over 60 Minutes Intravenous 2 times daily 05/21/12 0401     05/21/12 0045   cefTRIAXone (ROCEPHIN) 1 g in dextrose 5 % 50 mL IVPB        1 g 100 mL/hr over 30 Minutes Intravenous  Once 05/21/12 0032 05/21/12 0244   05/20/12 2300   vancomycin (VANCOCIN) IVPB 1000 mg/200 mL premix        1,000 mg 200 mL/hr over 60 Minutes Intravenous  Once 05/20/12 2248 05/21/12 0134   05/20/12 2300   vancomycin (VANCOCIN) IVPB 1000 mg/200 mL premix        1,000 mg 200 mL/hr over 60 Minutes Intravenous  Once 05/20/12 2248 05/20/12 2353   05/20/12 2230   vancomycin (VANCOCIN) IVPB 1000 mg/200 mL premix  Status:  Discontinued        1,000 mg 200 mL/hr over 60 Minutes Intravenous  To Emergency Dept 05/20/12 2147 05/20/12 2208   05/20/12 2230   vancomycin (VANCOCIN) 2,000 mg in sodium chloride 0.9 % 500 mL IVPB  Status:  Discontinued        2,000 mg 250 mL/hr over 120 Minutes Intravenous To Emergency Dept 05/20/12 2208 05/20/12 2248          Assessment/Plan: s/p Procedure(s) (LRB): IRRIGATION AND DEBRIDEMENT PERIRECTAL ABSCESS (N/A)  1. Continue with abx.  2. Patient will need f/u with our office in 1-2 weeks post discharge  3. Medical management per medicine.  LOS: 7 days    Bert Ptacek 05/27/2012

## 2012-05-28 LAB — BASIC METABOLIC PANEL
BUN: 17 mg/dL (ref 6–23)
CO2: 23 mEq/L (ref 19–32)
Calcium: 9 mg/dL (ref 8.4–10.5)
Chloride: 108 mEq/L (ref 96–112)
Creatinine, Ser: 2.2 mg/dL — ABNORMAL HIGH (ref 0.50–1.10)

## 2012-05-28 LAB — CULTURE, BLOOD (ROUTINE X 2)

## 2012-05-28 LAB — CBC
HCT: 29.6 % — ABNORMAL LOW (ref 36.0–46.0)
MCH: 30.7 pg (ref 26.0–34.0)
MCV: 95.8 fL (ref 78.0–100.0)
Platelets: 231 10*3/uL (ref 150–400)
RBC: 3.09 MIL/uL — ABNORMAL LOW (ref 3.87–5.11)
WBC: 12.3 10*3/uL — ABNORMAL HIGH (ref 4.0–10.5)

## 2012-05-28 MED ORDER — ZINC OXIDE 40 % EX OINT: TOPICAL_OINTMENT | Freq: Two times a day (BID) | CUTANEOUS | Status: DC

## 2012-05-28 MED ORDER — LEVOFLOXACIN 250 MG PO TABS: 250.0000 mg | ORAL_TABLET | Freq: Every day | ORAL | Status: DC

## 2012-05-28 MED ORDER — ALENDRONATE SODIUM 70 MG PO TABS: ORAL_TABLET | ORAL | Status: DC

## 2012-05-28 MED ORDER — DOXYCYCLINE HYCLATE 100 MG PO TABS: 100.0000 mg | ORAL_TABLET | Freq: Two times a day (BID) | ORAL | Status: AC

## 2012-05-28 MED ORDER — DOXYCYCLINE HYCLATE 100 MG PO TABS
100.0000 mg | ORAL_TABLET | Freq: Two times a day (BID) | ORAL | Status: DC
  Administered 2012-05-28: 100 mg via ORAL
  Filled 2012-05-28 (×2): qty 1

## 2012-05-28 NOTE — Discharge Summary (Signed)
Physician Discharge Summary  DISCHARGE SUMMARY   Patient ID: Maria Stone MR#: [id number] DOB/AGE: 57-Mar-1957 57 y.o.   Attending Physician:Jaelynn Pozo M  Patient's PCP:No primary provider on file.  Consults: Surgery  Admit date: 05/20/2012 Discharge date: 05/28/2012  Discharge Diagnoses:  Principal Problem:  *Perirectal abscess Active Problems:  Urinary tract infection  Hypotension  Obesity  Nausea and vomiting  Hypertension   Patient Active Problem List  Diagnosis  . Perirectal abscess  . Urinary tract infection  . Hypotension  . Obesity  . Nausea and vomiting  . Hypertension  . Osteoporosis  . Hyperlipidemia  . Osteoarthritis of both knees  . Anemia, hemolytic  . Asthma  . Chronic venous insufficiency   Past Medical History  Diagnosis Date  . Asthma   . Hemolytic anemia   . Morbid obesity   . Knee pain, bilateral   . Arthritis     Discharged Condition: stable   Discharge Medications: Medication List  As of 05/28/2012  2:02 PM   STOP taking these medications         furosemide 40 MG tablet      lisinopril 10 MG tablet      spironolactone-hydrochlorothiazide 25-25 MG per tablet         TAKE these medications         acetaminophen 500 MG tablet   Commonly known as: TYLENOL   Take 1,000 mg by mouth every 6 (six) hours as needed. For pain      alendronate 70 MG tablet   Commonly known as: FOSAMAX   Take every other Wednesday with a full glass of water on an empty stomach.      cholecalciferol 1000 UNITS tablet   Commonly known as: VITAMIN D   Take 1,000 Units by mouth daily.      doxycycline 100 MG tablet   Commonly known as: VIBRA-TABS   Take 1 tablet (100 mg total) by mouth every 12 (twelve) hours.      folic acid 1 MG tablet   Commonly known as: FOLVITE   Take 1 mg by mouth daily.      liver oil-zinc oxide 40 % ointment   Commonly known as: DESITIN   Apply topically 2 (two) times daily.      multivitamin with minerals  Tabs   Take 1 tablet by mouth daily.      NIACIN FLUSH FREE 500 MG Caps   Generic drug: Inositol Niacinate   Take 1 capsule by mouth daily.      Osteo Bi-Flex Joint Shield Tabs   Take 1 tablet by mouth daily.      traMADol 50 MG tablet   Commonly known as: ULTRAM   Take 50 mg by mouth daily as needed. For pain            Hospital Procedures: Ct Pelvis Wo Contrast  05/20/2012  *RADIOLOGY REPORT*  Clinical Data:  Rectal abscess  CT PELVIS WITHOUT CONTRAST  Technique:  Multidetector CT imaging of the pelvis was performed following the standard protocol without intravenous contrast.  Comparison:  03/07/2008  Findings:  Noncontrast images of the pelvis demonstrate abnormal stranding extending from the left ischiorectal fossa region inferiorly into the subcutaneous tissues, with a small amount of gas density in the soft tissues in the left lower perianal region which could represent a perianal fistula.  A well-defined fluid density abscess is not identified, although IV contrast could not be provided due to a dye allergy.  Inflammatory stranding extends  in the subcutaneous tissues of the left buttock.  The appendix appears normal.  There is an abnormal appearance of the cecum which is thick-walled.  The terminal ileum appears unremarkable.  Sigmoid diverticulosis noted without active diverticulitis.  Small bilateral inguinal lymph nodes are present.  IMPRESSION:  1.  Abnormal inflammatory stranding in the left ischiorectal fossa and perianal region, with a small amount of gas within the soft tissue density in the left perianal subcutaneous region, conceivably representing a perianal fistula or a small abscess.  A well-defined fluid density abscess is not observed, although IV contrast cannot be given due to a contrast allergy.  If clinically warranted, MRI using perianal fistula protocol with and without contrast could provide considerable regional detail. 2.  Suspected wall thickening in the cecum,  without abnormality of the appendix or terminal ileum.  Inflammation involving the cecum is not excluded. 3.  Sigmoid diverticulosis without active diverticulitis  Original Report Authenticated By: Dellia Cloud, M.D.   US Pelvis Limited  05/23/2012  *RADIOLOGY REPORT*  Clinical Data: Left buttock erythema. Evaluate for abscess.  US PELVIS LIMITED OR FOLLOW UP  Comparison: 05/20/2012 CT  Findings: Focused sonographic images in the area of skin induration demonstrate no associated loculated fluid collection.  Mild subcutaneous fat edema.  Note that these images were unable to evaluate the ischioanal fossa or perianal space, where an abscess was questioned on the recent CT.  IMPRESSION: No superficial abscess identified.  Note that these images do not evaluate the ischioanal fossa or perianal space, where an abscess was questioned on the recent CT. MRI recommended to evaluate for perianal abscess.  Original Report Authenticated By: Waneta Martins, M.D.    History of Present Illness: 56 year old woman who was admitted with worsening symptoms of rectal pain, fever, decreased appetite, nausea, and vomiting.  She had been using Anusol HC for possible hemorrhoids without improvement in her symptoms. She denied problems with shortness of breath, cough, chest pain, abdominal pain, dysuria, or frequency. She has several medical problems including morbid obesity, hypertension, chronic venous insufficiency, a history of hemolytic anemia treated with prednisone until about 5 years ago, asthma, and hyperlipidemia. Emergency room workup was most significant for a CT scan of the abdomen and pelvis that showed some inflammatory change in the left perirectal region with possible gas consistent with abscess or fistula. There was no evidence of diverticulitis or other site of infection. Labs in the emergency room was significant for bacteriuria and moderate leukocytosis with a white blood cell count greater than  20,000. She initially felt poorly in the emergency room with nausea and vomiting. Left-sided rectal pain.  She was given IV Abx and Admitted.   Hospital Course: She was admitted 05/21/12 with Fever, nausea, vomiting, buttock cellulitis and fear of a a perirectal abscess, and less likely from a urinary tract infection given her presenting symptoms. She was placed on broad-spectrum antibiotics with vancomycin, Rocephin, and ciprofloxacin given her septic physiology. She had hypotension and Azotemia.  The goal was to obtain an MRI scan of the rectal area to see if she truly has an abscess or fistula as the CT was without contrast.  B/c of her morbid obesity se was too big for the MRI scanner and it could not be done.  Surgery was consulted and ordered an [name].  It was difficult to interpret.  She improved over the weekend, then on Monday am was OK, but worsened throughout the day.  I called surgery and they put her  on the OR schedule.  She was operated on 05/25/12 with Incision and drainage of left perirectal abscess-with 1/2 inch iodophor gauze placement.  Pus was sent for culture.  She did well with the procedure.  On POD #1 she had the iodophor removed.  She remained on IV Abx throughout the time.  I stopped them 05/28/12 and placed her on PO Doxy for possible MRSA.  I thought @ Bactrim but her renal insufficiency made me stay clear.  I thought @ Levaquin but no MRSA coverage. Anaerobic culture at this time shows Gram Stain: RARE WBC PRESENT,BOTH PMN AND MONONUCLEAR NO SQUAMOUS EPITHELIAL CELLS SEEN NO ORGANISMS SEEN Culture: NO ANAEROBES ISOLATED; CULTURE IN PROGRESS FOR 5 DAYS         and other culture showed Gram Stain: NO WBC SEEN,  NO SQUAMOUS EPITHELIAL CELLS SEEN MODERATE GRAM POSITIVE COCCI IN PAIRS Culture: Culture reincubated for better growth Report Status: PENDING  I will follow cultures and if more specific organism and sensitivity, I will switch her.  Local care was given and she will get [facility] wound  care and use pads inbetween visits.  She can have Vicodin for pain.  #2 Hypotension: From intravascular volume depletion. Her usual medications for hypertension were and are being held.   #3 Acute Renal Insufficiency/Azotemia: She had an increase in her serum creatinine level that is most likely from hypotension with renal hypoperfusion. Accordingly her lisinopril/lasix/diuretics were held and her renal function was followed closely. She was given IVF also.  Her cr improved and then worsened. Now > 2 again. BP improved. The assumption was that she had mild ATN due to infection/hypotension. Monitor. Cr 1.9 - 2.20.  i will see her on Wednesday and repeat labs.  #4 Urinary tract infection?- urine culture negative. No Sxs  #5. Morbid Obesity- Needs significant weight loss. She discussed possible future bariatric surgery with Dr Daphine Deutscher.   #6. She had sig Nausea and vomiting- Zofran was given prn.  She improved post surgery.   #7. Stage 2 Sacral Decubitus ulcer- continue topical barrier protection.   #8. Disposition- discharged 05/28/12.   #9. HTN - fine off ACEi/diuretics.  Follow up as outpt.  #10. Anemia - Hbg stable. Following.10.3 - 9.4 post surgery...follow   #11. Asthma - breathing well. No Sxs.  #12. OA   #13. 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   #15. Osteoporosis - D/c on QO weekly Fosomax dosing  #16. Chronic pain - will switch back to tramadol prn after Vicodin  #17. gait distubance - Cane/walker. She has been walking and reports she does not need PT.  Dr Evlyn Kanner saw he in the am and I talked to her on the phone twice today .  I d/ced her and will see her back quickly.  Day of Discharge Exam BP 130/67  Pulse 92  Temp 98.2 F (36.8 C) (Oral)  Resp 18  Ht 5\' 1"  (1.549 m)  Wt 173.4 kg (382 lb 4.4 oz)  BMI 72.23 kg/m2  SpO2 96%  Physical Exam: See Dr Lyndle Herrlich exam  Discharge Labs:  [facility] 05/28/12 0344  05/27/12 0347  NA 139 136  K 4.9 4.3  CL 108 105  CO2 23 24  GLUCOSE 106* 95  BUN 17 19  CREATININE 2.20* 2.12*  CALCIUM 9.0 8.5  MG -- --  PHOS -- --   No results found for this basename: AST:2,ALT:2,ALKPHOS:2,BILITOT:2,PROT:2,ALBUMIN:2 in the last 72 hours  Basename  05/28/12 0344 05/27/12 0347  WBC 12.3* 12.7*  NEUTROABS -- --  HGB 9.5* 9.4*  HCT 29.6* 29.1*  MCV 95.8 95.1  PLT 231 220   No results found for this basename: CKTOTAL:3,CKMB:3,CKMBINDEX:3,TROPONINI:3 in the last 72 hours No results found for this basename: TSH,T4TOTAL,FREET3,T3FREE,THYROIDAB in the last 72 hours No results found for this basename: VITAMINB12:2,FOLATE:2,FERRITIN:2,TIBC:2,IRON:2,RETICCTPCT:2 in the last 72 hours Lab Results  Component Value Date   INR 1.1 03/06/2008       Discharge instructions:   Follow-up Information    Follow up with MARTIN,MATTHEW B, MD in 2 weeks. (If symptoms worsen call our office)    Contact information:   [facility] [address], [address] [place] [address] [phone]       Follow up with [name], MD in 1 week.   Contact information:   [address], [place]. [place] [address] [phone]           Disposition: Home  Follow-up Appts: Follow-up with Dr. Timothy Lasso at [facility] in 1 week.  Call for appointment.  Condition on Discharge:Stable  Tests Needing Follow-up: CBC and CMET  Time spent in discharge (includes decision making & examination of pt): 25 minutes.  SignedGwen Pounds 05/28/2012, 2:02 PM

## 2012-05-28 NOTE — Progress Notes (Signed)
Subjective: Had a good night,. No chills or sweats. tmax 99.2. Several BM's without pain. No CP or SOB Eating well Objective: Vital signs in last 24 hours: Temp:  [98.2 F (36.8 C)-99.2 F (37.3 C)] 98.2 F (36.8 C) (07/19 0647) Pulse Rate:  [85-104] 92  (07/19 0647) Resp:  [18-19] 18  (07/19 0647) BP: (117-130)/(64-70) 130/67 mmHg (07/19 0647) SpO2:  [96 %-99 %] 96 % (07/19 0647)  Intake/Output from previous day: 07/18 0701 - 07/19 0700 In: 735 [P.O.:360; I.V.:375] Out: -  Intake/Output this shift:    General: alert heavyWF sitting up in no distress. Sclera anicteric. Oral membranes moist, neck supple. Lungs clear. Ht regular distant. abd obese soft NT, extrems bilat edema. Alert awake, mentating well Lab Results   [facility] 05/28/12 0344 05/27/12 0347  WBC 12.3* 12.7*  RBC 3.09* 3.06*  HGB 9.5* 9.4*  HCT 29.6* 29.1*  MCV 95.8 95.1  MCH 30.7 30.7  RDW 12.9 12.9  PLT 231 220    Basename 05/28/12 0344 05/27/12 0347  NA 139 136  K 4.9 4.3  CL 108 105  CO2 23 24  GLUCOSE 106* 95  BUN 17 19  CREATININE 2.20* 2.12*  CALCIUM 9.0 8.5    Studies/Results: No results found.  Scheduled Meds:   . cefTRIAXone (ROCEPHIN)  IV  1 g Intravenous QHS  . cholecalciferol  1,000 Units Oral Daily  . ciprofloxacin  400 mg Intravenous BID  . folic acid  1 mg Oral Daily  . heparin  5,000 Units Subcutaneous Q8H  . liver oil-zinc oxide   Topical BID  . multivitamin with minerals  1 tablet Oral Daily  . ondansetron  4 mg Intravenous Once  . vancomycin  1,500 mg Intravenous Q24H   Continuous Infusions:   . 0.9 % NaCl with KCl 20 mEq / L 40 mL/hr at 05/28/12 0709   PRN Meds:acetaminophen, acetaminophen, alum & mag hydroxide-simeth, bisacodyl, HYDROcodone-acetaminophen, morphine injection, ondansetron (ZOFRAN) IV, ondansetron, traMADol, zolpidem, DISCONTD: HYDROcodone-acetaminophen  Assessment/Plan:  *Perirectal abscess  Active Problems:  Urinary tract infection  Hypotension    Obesity  Nausea and vomiting  Hypertension   1. *Perirectal abscess- S/P I & D. Cxs still not final. Earlier BC negative. WBC better. Still some minor fever. Stop abx 2. Urinary tract infection- urine culture negative.   3. Hypotension - improved 4. Acute Renal Failure- improved and then worsened sl worse at 2.20 5. Morbid Obesity- Needs significant weight loss. Discussed possible future bariatric surgery with Dr Daphine Deutscher.  6. Nausea and vomiting- Zofran prn.  7. Stage 2 Sacral Decubitus ulcer- continue topical barrier protection.  8. Disposition- anticipate discharge when better.  9. HTN - fine off ACEi/diuretics.  10. Anemia - Hbg stable. At 9.5 11. Asthma - breathing well  12. OA  13. Venous stasis,  14 Autoimmune hemolytic anemia @ 2008 - no current evidence.  15. Osteoporosis  16. Chronic pain  17. gait distubance - Cane/walker. She has been walking and reports she does not need PT. offered again    LOS: 8 days   Jazmaine Fuelling ALAN 05/28/2012, 9:20 AM

## 2012-05-30 LAB — ANAEROBIC CULTURE

## 2012-05-30 LAB — CULTURE, ROUTINE-ABSCESS

## 2012-07-01 ENCOUNTER — Ambulatory Visit (INDEPENDENT_AMBULATORY_CARE_PROVIDER_SITE_OTHER): Payer: 59 | Admitting: Surgery

## 2012-07-01 ENCOUNTER — Encounter (INDEPENDENT_AMBULATORY_CARE_PROVIDER_SITE_OTHER): Payer: Self-pay | Admitting: Surgery

## 2012-07-01 VITALS — BP 142/72 | HR 104 | Temp 96.4°F | Ht 61.0 in | Wt 362.8 lb

## 2012-07-01 DIAGNOSIS — K611 Rectal abscess: Secondary | ICD-10-CM

## 2012-07-01 DIAGNOSIS — K612 Anorectal abscess: Secondary | ICD-10-CM

## 2012-07-01 NOTE — Patient Instructions (Signed)
Continue to keep clean but allow to heal in over time.   Stay on the [facility] diet (AVOID CARBS)

## 2012-07-01 NOTE — Progress Notes (Signed)
Maria Stone 56 y.o.  Body mass index is 68.55 kg/(m^2).  Patient Active Problem List  Diagnosis  . Perirectal abscess  . Urinary tract infection  . Hypotension  . Obesity  . Nausea and vomiting  . Hypertension  . Osteoporosis  . Hyperlipidemia  . Osteoarthritis of both knees  . Anemia, hemolytic  . Asthma  . Chronic venous insufficiency    Allergies  Allergen Reactions  . Nsaids Anaphylaxis  . Contrast Media (Iodinated Diagnostic Agents) Swelling  . Penicillins Other (See Comments)    Childhood allergy  . Codeine Rash    Past Surgical History  Procedure Date  . Incision and drainage perirectal abscess 05/25/2012    Procedure: IRRIGATION AND DEBRIDEMENT PERIRECTAL ABSCESS;  Surgeon: Valarie Merino, MD;  Location: [facility]S;  Service: General;  Laterality: N/A;   [name], MD No diagnosis found.  Has healed.  Is doing well after the I&D of her abscess. I examined that and it appears to be almost completely healed and is nontender. She will restart her work at [employer] call sooner on Monday. She has been on [employer] and is very lost several pounds. I've encouraged her CLO carb diet. Today's weight is 362.8 shows she has a BMI that is about 68.  I will be happy to see her again whenever needed and future. She is not infected in any bariatric surgery and I have encouraged her to stay on a low carb diet. Matt B. Daphine Deutscher, MD, [facility] [phone] beeper [phone]  07/01/2012 11:19 AM

## 2012-07-23 ENCOUNTER — Encounter (INDEPENDENT_AMBULATORY_CARE_PROVIDER_SITE_OTHER): Payer: Self-pay | Admitting: Surgery

## 2012-07-23 ENCOUNTER — Ambulatory Visit (INDEPENDENT_AMBULATORY_CARE_PROVIDER_SITE_OTHER): Payer: 59 | Admitting: Surgery

## 2012-07-23 ENCOUNTER — Encounter (INDEPENDENT_AMBULATORY_CARE_PROVIDER_SITE_OTHER): Payer: Self-pay | Admitting: General Surgery

## 2012-07-23 VITALS — BP 130/72 | HR 76 | Temp 98.4°F | Resp 14 | Ht 61.0 in | Wt 371.0 lb

## 2012-07-23 DIAGNOSIS — K611 Rectal abscess: Secondary | ICD-10-CM

## 2012-07-23 NOTE — Progress Notes (Signed)
Maria Stone 56 y.o.  Body mass index is 70.10 kg/(m^2).  Patient Active Problem List  Diagnosis  . Perirectal abscess  . Urinary tract infection  . Hypotension  . Obesity  . Nausea and vomiting  . Hypertension  . Osteoporosis  . Hyperlipidemia  . Osteoarthritis of both knees  . Anemia, hemolytic  . Asthma  . Chronic venous insufficiency    Allergies  Allergen Reactions  . Nsaids Anaphylaxis  . Contrast Media (Iodinated Diagnostic Agents) Swelling  . Penicillins Other (See Comments)    Childhood allergy  . Codeine Rash    Past Surgical History  Procedure Date  . Incision and drainage perirectal abscess 05/25/2012    Procedure: IRRIGATION AND DEBRIDEMENT PERIRECTAL ABSCESS;  Surgeon: Valarie Merino, MD;  Location: [facility];  Service: General;  Laterality: N/A;   [name], MD No diagnosis found.  New sore on the left buttocks.  This began hurting several days ago. She saw Dr. Timothy Lasso and he was able to open it. It looks like it has drained. I put a Q-tip swab inside it and got no purulence. There is no surrounding induration. It is about the size of a quarter.  I considered giving her an oral antibiotic but I think that might not be the wise thing in the overall scheme of things. I think it topical such as Neosporin would be better. I'll be glad to see her him whenever needed. Term. Matt B. Daphine Deutscher, MD, [facility] [phone] beeper [phone]  07/23/2012 4:46 PM

## 2012-07-23 NOTE — Patient Instructions (Signed)
Apply neosporin to the sore on your left buttocks Keep area clean

## 2017-12-21 ENCOUNTER — Other Ambulatory Visit: Payer: Self-pay | Admitting: Orthopedic Surgery

## 2017-12-21 DIAGNOSIS — R229 Localized swelling, mass and lump, unspecified: Principal | ICD-10-CM

## 2017-12-21 DIAGNOSIS — IMO0002 Reserved for concepts with insufficient information to code with codable children: Secondary | ICD-10-CM

## 2017-12-23 ENCOUNTER — Ambulatory Visit
Admission: RE | Admit: 2017-12-23 | Discharge: 2017-12-23 | Disposition: A | Discharge status: Home / Self Care | Payer: 59 | Source: Ambulatory Visit | Attending: Orthopedic Surgery | Admitting: Orthopedic Surgery

## 2017-12-23 DIAGNOSIS — IMO0002 Reserved for concepts with insufficient information to code with codable children: Secondary | ICD-10-CM

## 2017-12-23 DIAGNOSIS — R229 Localized swelling, mass and lump, unspecified: Principal | ICD-10-CM

## 2017-12-29 ENCOUNTER — Other Ambulatory Visit: Payer: Self-pay | Admitting: Orthopedic Surgery

## 2017-12-30 NOTE — Progress Notes (Signed)
Called and left confidential voice message for patient with call back # to return call regarding pre-op instructions/question. 12/30/17 @ 1320

## 2017-12-31 ENCOUNTER — Other Ambulatory Visit: Payer: Self-pay

## 2017-12-31 ENCOUNTER — Encounter (HOSPITAL_COMMUNITY): Payer: Self-pay | Admitting: *Deleted

## 2017-12-31 MED ORDER — VANCOMYCIN HCL 10 G IV SOLR
2000.0000 mg | INTRAVENOUS | Status: AC
  Administered 2018-01-01: 2000 mg via INTRAVENOUS
  Filled 2017-12-31: qty 2000

## 2017-12-31 NOTE — Progress Notes (Signed)
Pt denies SOB, chest pain, and being under the care of a cardiologist. Pt denies having a stress test, echo and cardiac cath. Pt stated that labs were recently drawn at surgeons office and an EKG was performed at PCP[facility]; records requested.  Pt made aware to stop taking vitamins, fish oil, Biotin, Osteoflex, Airborne and herbal medications. Do not take any NSAIDs ie: Diclofenac (Voltaren), Ibuprofen, Advil, Naproxen (Aleve), Motrin, BC and Goody Powder. Pt verbalized understanding of all pre-op instructions.

## 2018-01-01 ENCOUNTER — Ambulatory Visit (HOSPITAL_COMMUNITY)
Admission: RE | Admit: 2018-01-01 | Discharge: 2018-01-01 | Disposition: A | Discharge status: Home / Self Care | Payer: 59 | Source: Ambulatory Visit | Attending: Orthopedic Surgery | Admitting: Orthopedic Surgery

## 2018-01-01 ENCOUNTER — Ambulatory Visit (HOSPITAL_COMMUNITY): Payer: 59 | Admitting: Certified Registered Nurse Anesthetist

## 2018-01-01 ENCOUNTER — Encounter (HOSPITAL_COMMUNITY)
Admission: RE | Disposition: A | Discharge status: Home / Self Care | Payer: Self-pay | Source: Ambulatory Visit | Attending: Orthopedic Surgery

## 2018-01-01 ENCOUNTER — Encounter (HOSPITAL_COMMUNITY): Payer: Self-pay | Admitting: Orthopedic Surgery

## 2018-01-01 DIAGNOSIS — Z87891 Personal history of nicotine dependence: Secondary | ICD-10-CM | POA: Insufficient documentation

## 2018-01-01 DIAGNOSIS — Z6841 Body Mass Index (BMI) 40.0 and over, adult: Secondary | ICD-10-CM | POA: Diagnosis not present

## 2018-01-01 DIAGNOSIS — R2231 Localized swelling, mass and lump, right upper limb: Secondary | ICD-10-CM | POA: Diagnosis present

## 2018-01-01 DIAGNOSIS — I10 Essential (primary) hypertension: Secondary | ICD-10-CM | POA: Insufficient documentation

## 2018-01-01 DIAGNOSIS — M1A9XX1 Chronic gout, unspecified, with tophus (tophi): Secondary | ICD-10-CM | POA: Diagnosis not present

## 2018-01-01 HISTORY — PX: MASS EXCISION: SHX2000

## 2018-01-01 HISTORY — DX: Localized swelling, mass and lump, unspecified: R22.9

## 2018-01-01 HISTORY — DX: Anxiety disorder, unspecified: F41.9

## 2018-01-01 HISTORY — DX: Reserved for concepts with insufficient information to code with codable children: IMO0002

## 2018-01-01 HISTORY — DX: Family history of other specified conditions: Z84.89

## 2018-01-01 LAB — BASIC METABOLIC PANEL
Anion gap: 7 (ref 5–15)
BUN: 25 mg/dL — ABNORMAL HIGH (ref 6–20)
CHLORIDE: 105 mmol/L (ref 101–111)
CO2: 29 mmol/L (ref 22–32)
CREATININE: 1.06 mg/dL — AB (ref 0.44–1.00)
Calcium: 9.1 mg/dL (ref 8.9–10.3)
GFR calc Af Amer: >60 mL/min (ref >60)
GFR, EST NON AFRICAN AMERICAN: 55 mL/min — AB (ref >60)
Glucose, Bld: 93 mg/dL (ref 65–99)
Potassium: 5.1 mmol/L (ref 3.5–5.1)
SODIUM: 141 mmol/L (ref 135–145)

## 2018-01-01 LAB — CBC
HCT: 43.1 % (ref 36.0–46.0)
HEMOGLOBIN: 13.8 g/dL (ref 12.0–15.0)
MCH: 31.5 pg (ref 26.0–34.0)
MCHC: 32 g/dL (ref 30.0–36.0)
MCV: 98.4 fL (ref 78.0–100.0)
PLATELETS: 161 10*3/uL (ref 150–400)
RBC: 4.38 MIL/uL (ref 3.87–5.11)
RDW: 13.2 % (ref 11.5–15.5)
WBC: 6.2 10*3/uL (ref 4.0–10.5)

## 2018-01-01 SURGERY — EXCISION MASS
Anesthesia: Monitor Anesthesia Care | Site: Finger | Laterality: Right

## 2018-01-01 MED ORDER — FENTANYL CITRATE (PF) 250 MCG/5ML IJ SOLN
INTRAMUSCULAR | Status: AC
  Filled 2018-01-01: qty 5

## 2018-01-01 MED ORDER — FENTANYL CITRATE (PF) 100 MCG/2ML IJ SOLN
INTRAMUSCULAR | Status: DC | PRN
  Administered 2018-01-01: 50 ug via INTRAVENOUS
  Administered 2018-01-01: 25 ug via INTRAVENOUS
  Administered 2018-01-01: 50 ug via INTRAVENOUS

## 2018-01-01 MED ORDER — PROPOFOL 500 MG/50ML IV EMUL
INTRAVENOUS | Status: DC | PRN
  Administered 2018-01-01: 25 ug/kg/min via INTRAVENOUS

## 2018-01-01 MED ORDER — PHENYLEPHRINE 40 MCG/ML (10ML) SYRINGE FOR IV PUSH (FOR BLOOD PRESSURE SUPPORT)
PREFILLED_SYRINGE | INTRAVENOUS | Status: AC
  Filled 2018-01-01: qty 10

## 2018-01-01 MED ORDER — LACTATED RINGERS IV SOLN
INTRAVENOUS | Status: DC
  Administered 2018-01-01: 13:00:00 via INTRAVENOUS

## 2018-01-01 MED ORDER — LIDOCAINE HCL (PF) 1 % IJ SOLN
INTRAMUSCULAR | Status: DC | PRN
  Administered 2018-01-01: 5 mL

## 2018-01-01 MED ORDER — MIDAZOLAM HCL 2 MG/2ML IJ SOLN
INTRAMUSCULAR | Status: AC
  Filled 2018-01-01: qty 2

## 2018-01-01 MED ORDER — BUPIVACAINE HCL (PF) 0.25 % IJ SOLN
INTRAMUSCULAR | Status: AC
  Filled 2018-01-01: qty 30

## 2018-01-01 MED ORDER — PHENYLEPHRINE 40 MCG/ML (10ML) SYRINGE FOR IV PUSH (FOR BLOOD PRESSURE SUPPORT)
PREFILLED_SYRINGE | INTRAVENOUS | Status: DC | PRN
  Administered 2018-01-01: 80 ug via INTRAVENOUS

## 2018-01-01 MED ORDER — TRAMADOL HCL 50 MG PO TABS: 50.0000 mg | ORAL_TABLET | Freq: Four times a day (QID) | ORAL | 0 refills | Status: AC | PRN

## 2018-01-01 MED ORDER — MIDAZOLAM HCL 5 MG/5ML IJ SOLN
INTRAMUSCULAR | Status: DC | PRN
  Administered 2018-01-01: 2 mg via INTRAVENOUS

## 2018-01-01 MED ORDER — PROPOFOL 10 MG/ML IV BOLUS
INTRAVENOUS | Status: DC | PRN
  Administered 2018-01-01: 25 mg via INTRAVENOUS

## 2018-01-01 MED ORDER — LIDOCAINE HCL (PF) 1 % IJ SOLN
INTRAMUSCULAR | Status: AC
  Filled 2018-01-01: qty 30

## 2018-01-01 MED ORDER — PROPOFOL 10 MG/ML IV BOLUS
INTRAVENOUS | Status: AC
  Filled 2018-01-01: qty 20

## 2018-01-01 MED ORDER — BUPIVACAINE HCL (PF) 0.25 % IJ SOLN
INTRAMUSCULAR | Status: DC | PRN
  Administered 2018-01-01: 5 mL

## 2018-01-01 SURGICAL SUPPLY — 40 items
BLADE MINI RND TIP GREEN BEAV (BLADE) IMPLANT
BNDG CMPR 9X4 STRL LF SNTH (GAUZE/BANDAGES/DRESSINGS) ×1
BNDG COHESIVE 1X5 TAN STRL LF (GAUZE/BANDAGES/DRESSINGS) ×1 IMPLANT
BNDG COHESIVE 3X5 TAN STRL LF (GAUZE/BANDAGES/DRESSINGS) IMPLANT
BNDG ESMARK 4X9 LF (GAUZE/BANDAGES/DRESSINGS) ×2 IMPLANT
BNDG GAUZE ELAST 4 BULKY (GAUZE/BANDAGES/DRESSINGS) IMPLANT
CORDS BIPOLAR (ELECTRODE) ×2 IMPLANT
CUFF TOURNIQUET SINGLE 18IN (TOURNIQUET CUFF) IMPLANT
CUFF TOURNIQUET SINGLE 24IN (TOURNIQUET CUFF) IMPLANT
DECANTER SPIKE VIAL GLASS SM (MISCELLANEOUS) IMPLANT
DRSG KUZMA FLUFF (GAUZE/BANDAGES/DRESSINGS) IMPLANT
DURAPREP 26ML APPLICATOR (WOUND CARE) ×2 IMPLANT
GAUZE SPONGE 2X2 8PLY STRL LF (GAUZE/BANDAGES/DRESSINGS) IMPLANT
GAUZE SPONGE 4X4 12PLY STRL (GAUZE/BANDAGES/DRESSINGS) IMPLANT
GAUZE XEROFORM 1X8 LF (GAUZE/BANDAGES/DRESSINGS) ×2 IMPLANT
GLOVE BIO SURGEON STRL SZ 6.5 (GLOVE) ×2 IMPLANT
GLOVE SURG ORTHO 8.0 STRL STRW (GLOVE) ×2 IMPLANT
GOWN STRL REUS W/ TWL LRG LVL3 (GOWN DISPOSABLE) ×2 IMPLANT
GOWN STRL REUS W/ TWL XL LVL3 (GOWN DISPOSABLE) ×1 IMPLANT
GOWN STRL REUS W/TWL LRG LVL3 (GOWN DISPOSABLE) ×4
GOWN STRL REUS W/TWL XL LVL3 (GOWN DISPOSABLE) ×2
KIT BASIN OR (CUSTOM PROCEDURE TRAY) ×2 IMPLANT
KIT ROOM TURNOVER OR (KITS) ×2 IMPLANT
MANIFOLD NEPTUNE II (INSTRUMENTS) ×2 IMPLANT
NDL HYPO 25GX1X1/2 BEV (NEEDLE) IMPLANT
NEEDLE HYPO 25GX1X1/2 BEV (NEEDLE) IMPLANT
NS IRRIG 1000ML POUR BTL (IV SOLUTION) ×2 IMPLANT
PACK ORTHO EXTREMITY (CUSTOM PROCEDURE TRAY) ×2 IMPLANT
PAD ARMBOARD 7.5X6 YLW CONV (MISCELLANEOUS) ×4 IMPLANT
PAD CAST 4YDX4 CTTN HI CHSV (CAST SUPPLIES) IMPLANT
PADDING CAST COTTON 4X4 STRL (CAST SUPPLIES)
RUBBERBAND STERILE (MISCELLANEOUS) ×2 IMPLANT
SPECIMEN JAR SMALL (MISCELLANEOUS) ×2 IMPLANT
SPONGE GAUZE 2X2 STER 10/PKG (GAUZE/BANDAGES/DRESSINGS)
SUT ETHILON 5 0 PS 2 18 (SUTURE) IMPLANT
SYR CONTROL 10ML LL (SYRINGE) IMPLANT
TOWEL OR 17X24 6PK STRL BLUE (TOWEL DISPOSABLE) ×2 IMPLANT
TOWEL OR 17X26 10 PK STRL BLUE (TOWEL DISPOSABLE) ×2 IMPLANT
UNDERPAD 30X30 (UNDERPADS AND DIAPERS) ×2 IMPLANT
WATER STERILE IRR 1000ML POUR (IV SOLUTION) ×2 IMPLANT

## 2018-01-01 NOTE — Brief Op Note (Signed)
01/01/2018  3:07 PM  PATIENT:  Kerin SalenMarybeth K Crossett  62 y.o. female  PRE-OPERATIVE DIAGNOSIS:  MASS RIGHT MIDDLE FINGER  POST-OPERATIVE DIAGNOSIS:  MASS RIGHT MIDDLE FINGER  PROCEDURE:  Procedure(s): EXCISION MASS RIGHT MIDDLE FINGER (Right)  SURGEON:  Surgeon(s) and Role:    * Cindee SaltKuzma, Duvid Smalls, MD - Primary  PHYSICIAN ASSISTANT:   ASSISTANTS: none   ANESTHESIA:   local and IV sedation  EBL:  none BLOOD ADMINISTERED:none  DRAINS: none   LOCAL MEDICATIONS USED:  BUPIVICAINE  and XYLOCAINE   SPECIMEN:  Excision  DISPOSITION OF SPECIMEN:  PATHOLOGY  COUNTS:  YES  TOURNIQUET:  * Missing tourniquet times found for documented tourniquets in log: [id number] *  DICTATION: .Other Dictation: Dictation Number [id number]  PLAN OF CARE: Discharge to home after PACU  PATIENT DISPOSITION:  PACU - hemodynamically stable.

## 2018-01-01 NOTE — Transfer of Care (Signed)
Immediate Anesthesia Transfer of Care Note  Patient: Maria Stone  Procedure(s) Performed: EXCISION MASS RIGHT MIDDLE FINGER (Right Finger)  Patient Location: PACU  Anesthesia Type:MAC  Level of Consciousness: awake, alert , oriented and patient cooperative  Airway & Oxygen Therapy: Patient Spontanous Breathing and Patient connected to nasal cannula oxygen  Post-op Assessment: Report given to RN, Post -op Vital signs reviewed and stable and Patient moving all extremities X 4  Post vital signs: Reviewed and stable  Last Vitals:  Vitals:   01/01/18 1203 01/01/18 1204  BP:  (!) 165/44  Pulse: 91   Resp: 18   Temp: 37.1 C   SpO2: 95%     Last Pain:  Vitals:   01/01/18 1203  TempSrc: Oral         Complications: No apparent anesthesia complications

## 2018-01-01 NOTE — Discharge Instructions (Addendum)

## 2018-01-01 NOTE — Anesthesia Preprocedure Evaluation (Addendum)
Anesthesia Evaluation  Patient identified by MRN, date of birth, ID band Patient awake    Reviewed: Allergy & Precautions, H&P , NPO status , Patient's Chart, lab work & pertinent test results  History of Anesthesia Complications Negative for: history of anesthetic complications  Airway Mallampati: II  TM Distance: >3 FB Neck ROM: Full    Dental  (+) Teeth Intact, Caps, Dental Advisory Given   Pulmonary asthma , former smoker,    Pulmonary exam normal breath sounds clear to auscultation       Cardiovascular hypertension, Normal cardiovascular exam Rhythm:Regular     Neuro/Psych negative neurological ROS  negative psych ROS   GI/Hepatic negative GI ROS, Neg liver ROS,   Endo/Other  Morbid obesity  Renal/GU negative Renal ROS  negative genitourinary   Musculoskeletal negative musculoskeletal ROS (+)   Abdominal   Peds negative pediatric ROS (+)  Hematology negative hematology ROS (+)   Anesthesia Other Findings   Reproductive/Obstetrics negative OB ROS                            Anesthesia Physical  Anesthesia Plan  ASA: III  Anesthesia Plan: MAC   Post-op Pain Management:    Induction: Intravenous  PONV Risk Score and Plan: 2 and Ondansetron and Dexamethasone  Airway Management Planned: Natural Airway and Simple Face Mask  Additional Equipment:   Intra-op Plan:   Post-operative Plan: Extubation in OR  Informed Consent: I have reviewed the patients History and Physical, chart, labs and discussed the procedure including the risks, benefits and alternatives for the proposed anesthesia with the patient or authorized representative who has indicated his/her understanding and acceptance.   Dental advisory given  Plan Discussed with: CRNA and Anesthesiologist  Anesthesia Plan Comments:         Anesthesia Quick Evaluation

## 2018-01-01 NOTE — Op Note (Signed)
Maria Stone:  Stone, Maria            ACCOUNT NO.:  [id number]  MEDICAL RECORD NO.:  [id number]  LOCATION:                                 FACILITY:  PHYSICIAN:  Cindee SaltGary Ethelyn Cerniglia, M.D.            DATE OF BIRTH:  DATE OF PROCEDURE:  01/01/2018 DATE OF DISCHARGE:                              OPERATIVE REPORT   PREOPERATIVE DIAGNOSIS:  Mass, right middle finger.  POSTOPERATIVE DIAGNOSIS:  Mass, right middle finger.  OPERATION:  Excision of mass, right middle finger.  SURGEON:  Cindee SaltGary Lucylle Foulkes, MD.  ASSISTANT:  None.  ANESTHESIA:  IV sedation with metacarpal block.  [facility]  ANESTHESIOLOGIST:  Krista BlueSinger.  HISTORY:  The patient is a 62 year old female with a history of a mass on her right middle finger which is painful.  This is in the radial aspect of the pulp.  She desires having this excised.  She does have a history of probable gout.  Pre, peri, and postoperative courses have been discussed along with risks and complications.  She is aware there is no guarantee to the surgery, the possibility of infection, recurrence of injury to arteries, nerves, and tendons, incomplete relief of symptoms, and dystrophy.  In the preoperative area, the patient is seen, the extremity marked by both patient and surgeon, antibiotic given.  DESCRIPTION OF PROCEDURE:  The patient was brought to the operating room where an IV sedation was carried out after she was prepped and draped in supine position with the right arm free.  A 3-minute dry time was allowed.  Prep was done with ChloraPrep.  A metacarpal block was given with 0.25% bupivacaine and 1% Xylocaine both without epinephrine.  A Penrose drain was used for tourniquet control at the base of the finger. A volar Brunner-type incision was made directly over the mass, carried down through subcutaneous tissue.  A chalky white material was immediately apparent with some localization and capsulization.  The neurovascular bundle was  identified deep to this.  With blunt and sharp dissection, this was dissected free and sent to Pathology.  The wound was copiously irrigated with saline.  It measured approximately 1-1.5 cm in diameter.  The wound was then closed with interrupted 4-0 nylon sutures.  A sterile compressive dressing and splint to the finger were applied.  On deflation of the tourniquet and removal prior to completion of the dressing, the finger immediately pinked.  After completion of the dressing, the patient was taken to the recovery room for observation in satisfactory condition.  She will be discharged to home, to [facility] [place], on tramadol.          ______________________________ Cindee SaltGary Karolyn Messing, M.D.     GK/MEDQ  D:  01/01/2018  T:  01/01/2018  Job:  [id number]

## 2018-01-01 NOTE — Anesthesia Procedure Notes (Signed)
Procedure Name: MAC Date/Time: 01/01/2018 2:42 PM Performed by: Colin Benton, CRNA Pre-anesthesia Checklist: Patient identified, Emergency Drugs available, Suction available and Patient being monitored Patient Re-evaluated:Patient Re-evaluated prior to induction Oxygen Delivery Method: Nasal cannula Induction Type: IV induction

## 2018-01-01 NOTE — Op Note (Signed)
Other Dictation: Dictation Number [id number]

## 2018-01-01 NOTE — H&P (Signed)
Maria Stone is an 62 y.o. female.   Chief Complaint: mass right middle fingerHPI:Maria Stone is a 62 year old right-hand-dominant female referred by Dr. Charlett Blake for consultation regarding masses on her left thumb and right middle finger. She states the middle finger is been going on for a year the thumb for the past 2 months. She is not complaining of pain in her thumb. She is complaining of an ache or sharp pain if she hits the mass on her middle finger which is at the distal interphalangeal joint. The thumb is at the IP joint. The middle finger is slightly volar the thumb is slightly dorsal. She did sustain a crush injury to her right index middle and ring fingers 3 years ago. She has no history of treat her left side. She has no history of diabetes thyroid problems she does have a history of arthritis no history of gout. Family history is positive diabetes thyroid problems and gout. Negative for arthritis. She has not been tested for gout she has been tested for diabetes. Is not taking anything for the fingers.She was sent for an ultrasound which reveals that this is hypoechoic. She has had laboratory work done revealing a uric acid of 8.8 sed rate of 35         Past Medical History:  Diagnosis Date  . Anxiety   . Arthritis   . Asthma   . Chronic pain   . Family history of adverse reaction to anesthesia    PONV for pt mother  . Heart murmur   . Hemolytic anemia (HCC)   . Hyperlipidemia   . Hypertension   . Knee pain, bilateral   . Mass    right middle finger  . Morbid obesity (HCC)   . Osteoporosis   . Perirectal abscess   . Venous stasis     Past Surgical History:  Procedure Laterality Date  . COLONOSCOPY    . INCISION AND DRAINAGE PERIRECTAL ABSCESS  05/25/2012   Procedure: IRRIGATION AND DEBRIDEMENT PERIRECTAL ABSCESS;  Surgeon: Valarie Merino, MD;  Location: [facility];  Service: General;  Laterality: N/A;    Family History  Problem Relation Age of Onset  . Diabetes  Unknown    Social History:  reports that she has quit smoking. Her smoking use included cigarettes. she has never used smokeless tobacco. She reports that she does not drink alcohol or use drugs.  Allergies:  Allergies  Allergen Reactions  . Nsaids Anaphylaxis  . Tolmetin Anaphylaxis  . Contrast Media [Iodinated Diagnostic Agents] Swelling  . Penicillins Other (See Comments)    Childhood allergy  . Codeine Rash    No medications prior to admission.    No results found for this or any previous visit (from the past 48 hour(s)).  No results found.   Pertinent items are noted in HPI.  Height 5\' 1"  (1.549 m), weight (!) 174.6 kg (385 lb).  General appearance: alert, cooperative and appears stated age Head: Normocephalic, without obvious abnormality Neck: no JVD Resp: clear to auscultation bilaterally Cardio: regular rate and rhythm, S1, S2 normal, no murmur, click, rub or gallop GI: soft, non-tender; bowel sounds normal; no masses,  no organomegaly Extremities: mass right middle finger Pulses: 2+ and symmetric Skin: Skin color, texture, turgor normal. No rashes or lesions Neurologic: Grossly normal Incision/Wound: na  Assessment/Plan Assessment:   Mass    Plan: Dr. Timothy Lasso is her family physician. She is advised to discuss with him the fact that her uric acid is 8.8  her sed rate 35. She would like to have the mass excised on her middle finger and that it is of a significant annoyance for her. Pre-peri-and postoperative course are discussed along with risks and complications. She is aware there is no guarantee to the surgery the possibility of infection recurrence injury to arteries nerves tendons she would like to proceed she is scheduled for excision mass right middle finger as an outpatient under regional anesthesia. 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      Maria Stone 01/01/2018, 8:44 AM

## 2018-01-01 NOTE — Anesthesia Postprocedure Evaluation (Signed)
Anesthesia Post Note  Patient: Maria Stone  Procedure(s) Performed: EXCISION MASS RIGHT MIDDLE FINGER (Right Finger)     Patient location during evaluation: PACU Anesthesia Type: MAC Level of consciousness: awake and alert Pain management: pain level controlled Vital Signs Assessment: post-procedure vital signs reviewed and stable Respiratory status: spontaneous breathing and respiratory function stable Cardiovascular status: stable Postop Assessment: no apparent nausea or vomiting Anesthetic complications: no    Last Vitals:  Vitals:   01/01/18 1514 01/01/18 1520  BP: 121/72 114/60  Pulse: 83 78  Resp: 16 16  Temp: 36.7 C   SpO2: 96% 96%    Last Pain:  Vitals:   01/01/18 1203  TempSrc: Oral                 Daniell Paradise DANIEL

## 2018-01-02 ENCOUNTER — Encounter (HOSPITAL_COMMUNITY): Payer: Self-pay | Admitting: Orthopedic Surgery

## 2018-12-07 ENCOUNTER — Encounter (INDEPENDENT_AMBULATORY_CARE_PROVIDER_SITE_OTHER): Payer: Self-pay

## 2018-12-14 ENCOUNTER — Encounter (INDEPENDENT_AMBULATORY_CARE_PROVIDER_SITE_OTHER): Payer: Self-pay

## 2018-12-30 ENCOUNTER — Encounter (INDEPENDENT_AMBULATORY_CARE_PROVIDER_SITE_OTHER): Payer: Self-pay | Admitting: Family Medicine

## 2018-12-30 ENCOUNTER — Ambulatory Visit (INDEPENDENT_AMBULATORY_CARE_PROVIDER_SITE_OTHER): Payer: BLUE CROSS/BLUE SHIELD | Admitting: Family Medicine

## 2018-12-30 VITALS — BP 111/68 | HR 71 | Ht 61.0 in | Wt 393.0 lb

## 2018-12-30 DIAGNOSIS — R5383 Other fatigue: Secondary | ICD-10-CM

## 2018-12-30 DIAGNOSIS — Z6841 Body Mass Index (BMI) 40.0 and over, adult: Secondary | ICD-10-CM

## 2018-12-30 DIAGNOSIS — Z9189 Other specified personal risk factors, not elsewhere classified: Secondary | ICD-10-CM

## 2018-12-30 DIAGNOSIS — I10 Essential (primary) hypertension: Secondary | ICD-10-CM | POA: Diagnosis not present

## 2018-12-30 DIAGNOSIS — F3289 Other specified depressive episodes: Secondary | ICD-10-CM

## 2018-12-30 DIAGNOSIS — E7849 Other hyperlipidemia: Secondary | ICD-10-CM

## 2018-12-30 DIAGNOSIS — R0602 Shortness of breath: Secondary | ICD-10-CM

## 2018-12-30 DIAGNOSIS — Z0289 Encounter for other administrative examinations: Secondary | ICD-10-CM

## 2018-12-31 LAB — LIPID PANEL WITH LDL/HDL RATIO
Cholesterol, Total: 243 mg/dL — ABNORMAL HIGH (ref 100–199)
HDL: 47 mg/dL (ref >39)
LDL Calculated: 169 mg/dL — ABNORMAL HIGH (ref 0–99)
LDl/HDL Ratio: 3.6 ratio — ABNORMAL HIGH (ref 0.0–3.2)
Triglycerides: 137 mg/dL (ref 0–149)
VLDL Cholesterol Cal: 27 mg/dL (ref 5–40)

## 2018-12-31 LAB — CBC WITH DIFFERENTIAL
BASOS: 1 %
Basophils Absolute: 0.1 10*3/uL (ref 0.0–0.2)
EOS (ABSOLUTE): 0.1 10*3/uL (ref 0.0–0.4)
Eos: 2 %
Hematocrit: 41.4 % (ref 34.0–46.6)
Hemoglobin: 13.9 g/dL (ref 11.1–15.9)
Immature Grans (Abs): 0 10*3/uL (ref 0.0–0.1)
Immature Granulocytes: 0 %
LYMPHS ABS: 1.4 10*3/uL (ref 0.7–3.1)
Lymphs: 22 %
MCH: 31.3 pg (ref 26.6–33.0)
MCHC: 33.6 g/dL (ref 31.5–35.7)
MCV: 93 fL (ref 79–97)
Monocytes Absolute: 0.6 10*3/uL (ref 0.1–0.9)
Monocytes: 9 %
NEUTROS PCT: 66 %
Neutrophils Absolute: 3.9 10*3/uL (ref 1.4–7.0)
RBC: 4.44 x10E6/uL (ref 3.77–5.28)
RDW: 11.7 % (ref 11.7–15.4)
WBC: 6 10*3/uL (ref 3.4–10.8)

## 2018-12-31 LAB — COMPREHENSIVE METABOLIC PANEL
ALT: 15 IU/L (ref 0–32)
AST: 21 IU/L (ref 0–40)
Albumin/Globulin Ratio: 1.9 (ref 1.2–2.2)
Albumin: 4.3 g/dL (ref 3.8–4.8)
Alkaline Phosphatase: 101 IU/L (ref 39–117)
BUN/Creatinine Ratio: 25 (ref 12–28)
BUN: 29 mg/dL — AB (ref 8–27)
Bilirubin Total: 0.8 mg/dL (ref 0.0–1.2)
CALCIUM: 9.7 mg/dL (ref 8.7–10.3)
CO2: 24 mmol/L (ref 20–29)
CREATININE: 1.14 mg/dL — AB (ref 0.57–1.00)
Chloride: 101 mmol/L (ref 96–106)
GFR, EST AFRICAN AMERICAN: 59 mL/min/{1.73_m2} — AB (ref >59)
GFR, EST NON AFRICAN AMERICAN: 51 mL/min/{1.73_m2} — AB (ref >59)
Globulin, Total: 2.3 g/dL (ref 1.5–4.5)
Glucose: 89 mg/dL (ref 65–99)
Potassium: 4.6 mmol/L (ref 3.5–5.2)
Sodium: 140 mmol/L (ref 134–144)
Total Protein: 6.6 g/dL (ref 6.0–8.5)

## 2018-12-31 LAB — HEMOGLOBIN A1C
Est. average glucose Bld gHb Est-mCnc: 108 mg/dL
Hgb A1c MFr Bld: 5.4 % (ref 4.8–5.6)

## 2018-12-31 LAB — VITAMIN B12: Vitamin B-12: 579 pg/mL (ref 232–1245)

## 2018-12-31 LAB — FOLATE: Folate: >20 ng/mL (ref >3.0)

## 2018-12-31 LAB — T3: T3, Total: 103 ng/dL (ref 71–180)

## 2018-12-31 LAB — VITAMIN D 25 HYDROXY (VIT D DEFICIENCY, FRACTURES): Vit D, 25-Hydroxy: 29.8 ng/mL — ABNORMAL LOW (ref 30.0–100.0)

## 2018-12-31 LAB — T4, FREE: Free T4: 1.28 ng/dL (ref 0.82–1.77)

## 2018-12-31 LAB — TSH: TSH: 1.61 u[IU]/mL (ref 0.450–4.500)

## 2018-12-31 LAB — INSULIN, RANDOM: INSULIN: 16.9 u[IU]/mL (ref 2.6–24.9)

## 2019-01-03 NOTE — Progress Notes (Signed)
Office: [phone]  /  Fax: [phone]   Dear Dr. Creola Corn,   Thank you for referring Maria Stone to our clinic. The following note includes my evaluation and treatment recommendations.  HPI:   Chief Complaint: OBESITY    SINAY SNELL has been referred by Dr. Creola Corn for consultation regarding her obesity and obesity related comorbidities.    NASIR STOLTMAN (MR# [id number]) is a 63 y.o. female who presents on 12/30/2018 for obesity evaluation and treatment. Current BMI is Body mass index is 74.26 kg/m.   Mariem has been struggling with her weight for many years and has been unsuccessful in either losing weight, maintaining weight loss, or reaching her healthy weight goal.     Dilia attended our information session and states she is currently in the action stage of change and ready to dedicate time achieving and maintaining a healthier weight. Penne is interested in becoming our patient and working on intensive lifestyle modifications including (but not limited to) diet, exercise and weight loss.    Myleen states her family eats meals together for family gatherings her desired weight loss is 233 lbs she has been heavy most of her life she started gaining weight after her divorce her heaviest weight ever was 400+ lbs. she may be a picky eater and doesn't like to eat healthier foods  she has significant food cravings issues  she snacks frequently in the evenings she skips meals frequently she is frequently drinking liquids with calories she frequently eats larger portions than normal  she has binge eating behaviors she struggles with emotional eating    Fatigue Faithe feels her energy is lower than it should be. This has worsened with weight gain and has not worsened recently. Kimberlyanne denies daytime somnolence and denies waking up still tired. Patient is at risk for obstructive sleep apnea. Patent has a history of symptoms of hypertension. Patient  generally gets 8 hours of sleep per night, and states they generally have generally restful sleep. Snoring is present. Apneic episodes are not present. Epworth Sleepiness Score is 6.  Dyspnea on exertion Dlynn notes increasing shortness of breath with exercising and seems to be worsening over time with weight gain. She notes getting out of breath sooner with activity than she used to. This has not gotten worse recently. Brealyn denies orthopnea.  Hypertension AUBRYNN BOSSIE is a 63 y.o. female with hypertension. Kadey's blood pressure is currently stable on medications. She is working on weight loss to help control her blood pressure with the goal of decreasing her risk of heart attack and stroke. Richa denies chest pain.  Hyperlipidemia Peris has hyperlipidemia and has been attempting to diet control to improve her cholesterol levels with intensive lifestyle modification including a low saturated fat diet, exercise and weight loss. She is not on a statin and denies any chest pain.  At risk for cardiovascular disease Braylan is at a higher than average risk for cardiovascular disease due to hypertension, hyperlipidemia, and obesity. She currently denies any chest pain.  Depression with emotional eating behaviors Mattie is on Prozac and struggling with emotional eating and using food for comfort to the extent that it is negatively impacting her health. She often snacks when she is not hungry. Rikayla often feels hopeless about her weight and tends to have an "all or nothing" personality.   Depression Screen Elzada's Food and Mood (modified PHQ-9) score was 8. Depression screen [facility] 2/9 12/30/2018  Decreased Interest 2  Down, Depressed, Hopeless  1  PHQ - 2 Score 3  Altered sleeping 0  Tired, decreased energy 1  Change in appetite 1  Feeling bad or failure about yourself  2  Trouble concentrating 1  Moving slowly or fidgety/restless 0  Suicidal thoughts 0  PHQ-9 Score  8  Difficult doing work/chores Not difficult at all   ASSESSMENT AND PLAN:  Other fatigue - Plan: EKG 12-Lead, Vitamin B12, CBC With Differential, Comprehensive metabolic panel, Folate, Hemoglobin A1c, Insulin, random, T3, T4, free, TSH, VITAMIN D 25 Hydroxy (Vit-D Deficiency, Fractures)  Shortness of breath on exertion  Essential hypertension - Plan: Comprehensive metabolic panel  Other hyperlipidemia - Plan: Lipid Panel With LDL/HDL Ratio  Other depression - with emotional eating  At risk for heart disease  Class 3 severe obesity with serious comorbidity and body mass index (BMI) greater than or equal to 70 in adult, unspecified obesity type (HCC)  PLAN:  Fatigue Carriann was informed that her fatigue may be related to obesity, depression or many other causes. Labs will be ordered, and in the meanwhile Jaquisha has agreed to work on diet, exercise and weight loss to help with fatigue. Proper sleep hygiene was discussed including the need for 7-8 hours of quality sleep each night. A sleep study was not ordered based on symptoms and Epworth score.  Dyspnea on exertion Sawyer's shortness of breath appears to be obesity related and exercise induced. She has agreed to work on weight loss and gradually increase exercise to treat her exercise induced shortness of breath. If Rim follows our instructions and loses weight without improvement of her shortness of breath, we will plan to refer to pulmonology. We will monitor this condition regularly. Anaiya agrees to this plan.  Hypertension We discussed sodium restriction, working on healthy weight loss, and a regular exercise program as the means to achieve improved blood pressure control. We will continue to monitor her blood pressure as well as her progress with the above lifestyle modifications. She will continue her medications as prescribed and will watch for signs of hypotension as she continues her lifestyle modifications. Labs  will be drawn today and Taygen agreed with this plan and agreed to follow up as directed in 2 weeks.  Hyperlipidemia Pessie was informed of the American Heart Association Guidelines emphasizing intensive lifestyle modifications as the first line treatment for hyperlipidemia. We discussed many lifestyle modifications today in depth, and Giona will continue to work on decreasing saturated fats such as fatty red meat, butter and many fried foods. She will also increase vegetables and lean protein in her diet and continue to work on exercise and weight loss efforts. Labs were ordered today and Briseida agrees to follow up as directed.  Depression with Emotional Eating Behaviors We discussed behavior modification techniques today to help Maripat deal with her emotional eating and depression. Patient was referred to Dr. Dewaine Conger, our bariatric psychologist for evaluation due to elevated PHQ-9 score and significant struggles with emotional eating. Arturo agrees to continue Prozac and will follow up at the agreed upon time.  Cardiovascular risk counseling Kristen was given extended (15 minutes) coronary artery disease prevention counseling today. She is 63 y.o. female and has risk factors for heart disease including hypertension, hyperlipidemia, and obesity. We discussed intensive lifestyle modifications today with an emphasis on specific weight loss instructions and strategies. Pt was also informed of the importance of increasing exercise and decreasing saturated fats to help prevent heart disease.  Depression Screen Valoria had a mildly positive depression screening. Depression  is commonly associated with obesity and often results in emotional eating behaviors. We will monitor this closely and work on CBT to help improve the non-hunger eating patterns. Referral to Psychology may be required if no improvement is seen as she continues in our clinic.  Obesity Tymesha is currently in the action stage  of change and her goal is to continue with weight loss efforts. I recommend Dorcas begin the structured treatment plan as follows:  She has agreed to follow the Category 4 plan. Breeze has been instructed to eventually work up to a goal of 150 minutes of combined cardio and strengthening exercise per week for weight loss and overall health benefits. We discussed the following Behavioral Modification Strategies today: increasing lean protein intake, decreasing simple carbohydrates, and work on meal planning and easy cooking plans.   She was informed of the importance of frequent follow up visits to maximize her success with intensive lifestyle modifications for her multiple health conditions. She was informed we would discuss her lab results at her next visit unless there is a critical issue that needs to be addressed sooner. Zayonna agreed to keep her next visit at the agreed upon time to discuss these results.  ALLERGIES: Allergies  Allergen Reactions  . Nsaids Anaphylaxis  . Tolmetin Anaphylaxis  . Contrast Media [Iodinated Diagnostic Agents] Swelling  . Penicillins Other (See Comments)    Childhood allergy  . Codeine Rash    MEDICATIONS: Current Outpatient Medications on File Prior to Visit  Medication Sig Dispense Refill  . acetaminophen (TYLENOL) 500 MG tablet Take 1,000 mg by mouth every 6 (six) hours as needed for moderate pain or headache.     . Cannabidiol 100 MG/ML SOLN Take by mouth.    . colchicine 0.6 MG tablet Take 0.6 mg by mouth daily.    Marland Kitchen FLUoxetine (PROZAC) 20 MG tablet Take 20 mg by mouth daily.    . folic acid (FOLVITE) 1 MG tablet Take 1 mg by mouth daily.    . furosemide (LASIX) 40 MG tablet Take 40 mg by mouth daily.    Marland Kitchen gabapentin (NEURONTIN) 300 MG capsule Take 300 mg by mouth 3 (three) times daily.    Marland Kitchen lisinopril (PRINIVIL,ZESTRIL) 10 MG tablet Take 10 mg by mouth daily.     Marland Kitchen loratadine (CLARITIN) 10 MG tablet Take 10 mg by mouth daily.    . Multiple  Vitamin (MULTIVITAMIN WITH MINERALS) TABS Take 1 tablet by mouth daily.    . Multiple Vitamins-Minerals (AIRBORNE GUMMIES PO) Take 1 each by mouth daily.    Marland Kitchen PROAIR HFA 108 (90 Base) MCG/ACT inhaler Inhale 2 puffs into the lungs every 6 (six) hours as needed for shortness of breath or wheezing.  3   No current facility-administered medications on file prior to visit.     PAST MEDICAL HISTORY: Past Medical History:  Diagnosis Date  . Anxiety   . Arthritis   . Asthma   . Chronic pain   . Chronic pain   . Family history of adverse reaction to anesthesia    PONV for pt mother  . Heart murmur   . Hemolytic anemia (HCC)   . Hyperlipidemia   . Hypertension   . Joint pain   . Knee pain, bilateral   . Lower extremity edema   . Mass    right middle finger  . Morbid obesity (HCC)   . Osteoarthritis   . Osteoporosis   . Perirectal abscess   . Venous stasis  PAST SURGICAL HISTORY: Past Surgical History:  Procedure Laterality Date  . COLONOSCOPY    . INCISION AND DRAINAGE PERIRECTAL ABSCESS  05/25/2012   Procedure: IRRIGATION AND DEBRIDEMENT PERIRECTAL ABSCESS;  Surgeon: Valarie Merino, MD;  Location: [facility];  Service: General;  Laterality: N/A;  . MASS EXCISION Right 01/01/2018   Procedure: EXCISION MASS RIGHT MIDDLE FINGER;  Surgeon: Cindee Salt, MD;  Location: [facility];  Service: Orthopedics;  Laterality: Right;    SOCIAL HISTORY: Social History   Tobacco Use  . Smoking status: Former Smoker    Types: Cigarettes  . Smokeless tobacco: Never Used  . Tobacco comment: 07/01/1976  Substance Use Topics  . Alcohol use: No    Frequency: Never  . Drug use: No    FAMILY HISTORY: Family History  Problem Relation Age of Onset  . Diabetes Father   . Hyperlipidemia Father   . Hypertension Father   . Heart disease Father   . Kidney disease Father   . Obesity Father   . Diabetes Other     ROS: Review of Systems  Constitutional: Positive for malaise/fatigue. Negative for  weight loss.  HENT:       Positive for hay fever.  Eyes:       Wears glasses or contacts. Positive for flashes of light. Positive for floaters.  Respiratory: Positive for shortness of breath and wheezing.   Cardiovascular: Negative for chest pain.       Positive for calf and leg pain with walking. Positive for leg cramping.  Musculoskeletal: Positive for joint pain and myalgias.  Psychiatric/Behavioral: Positive for depression.    PHYSICAL EXAM: Blood pressure 111/68, pulse 71, height  (1.549 m), weight (!) 393 lb (178.3 kg), SpO2 99 %. Body mass index is 74.26 kg/m. Physical Exam Vitals signs reviewed.  Constitutional:      Appearance: Normal appearance. She is obese.  HENT:     Head: Normocephalic and atraumatic.     Nose: Nose normal.  Eyes:     General: No scleral icterus.    Extraocular Movements: Extraocular movements intact.  Neck:     Musculoskeletal: Normal range of motion and neck supple.     Thyroid: No thyromegaly.     Comments: Negative for thyromegaly. Cardiovascular:     Rate and Rhythm: Normal rate and regular rhythm.  Pulmonary:     Effort: Pulmonary effort is normal. No respiratory distress.  Abdominal:     Palpations: Abdomen is soft.     Tenderness: There is no abdominal tenderness.     Comments: Positive for obesity.  Musculoskeletal:     Comments: ROM normal in all extremities.  Skin:    General: Skin is warm and dry.  Neurological:     Mental Status: She is alert and oriented to person, place, and time.     Coordination: Coordination normal.  Psychiatric:        Mood and Affect: Mood normal.        Behavior: Behavior normal.     RECENT LABS AND TESTS: BMET    Component Value Date/Time   NA 140 12/30/2018 1155   K 4.6 12/30/2018 1155   CL 101 12/30/2018 1155   CO2 24 12/30/2018 1155   GLUCOSE 89 12/30/2018 1155   GLUCOSE 93 01/01/2018 1312   BUN 29 (H) 12/30/2018 1155   CREATININE 1.14 (H) 12/30/2018 1155   CALCIUM 9.7  12/30/2018 1155   GFRNONAA 51 (L) 12/30/2018 1155   GFRAA 59 (L) 12/30/2018 1155  Lab Results  Component Value Date   HGBA1C 5.4 12/30/2018   Lab Results  Component Value Date   INSULIN 16.9 12/30/2018   CBC    Component Value Date/Time   WBC 6.0 12/30/2018 1155   WBC 6.2 01/01/2018 1215   RBC 4.44 12/30/2018 1155   RBC 4.38 01/01/2018 1215   HGB 13.9 12/30/2018 1155   HGB 15.2 12/12/2008 0804   HCT 41.4 12/30/2018 1155   HCT 43.8 12/12/2008 0804   PLT 161 01/01/2018 1215   PLT 175 12/12/2008 0804   MCV 93 12/30/2018 1155   MCV 90.8 12/12/2008 0804   MCH 31.3 12/30/2018 1155   MCH 31.5 01/01/2018 1215   MCHC 33.6 12/30/2018 1155   MCHC 32.0 01/01/2018 1215   RDW 11.7 12/30/2018 1155   RDW 13.6 12/12/2008 0804   LYMPHSABS 1.4 12/30/2018 1155   LYMPHSABS 1.5 12/12/2008 0804   MONOABS 1.3 (H) 05/20/2012 1348   MONOABS 0.5 12/12/2008 0804   EOSABS 0.1 12/30/2018 1155   BASOSABS 0.1 12/30/2018 1155   BASOSABS 0.0 12/12/2008 0804   Iron/TIBC/Ferritin/ %Sat    Component Value Date/Time   IRON 76 07/21/2008 0753   TIBC 295 07/21/2008 0753   FERRITIN 219 07/21/2008 0753   IRONPCTSAT 26 07/21/2008 0753   Lipid Panel     Component Value Date/Time   CHOL 243 (H) 12/30/2018 1155   TRIG 137 12/30/2018 1155   HDL 47 12/30/2018 1155   CHOLHDL 7.3 03/07/2008 0525   VLDL 38 03/07/2008 0525   LDLCALC 169 (H) 12/30/2018 1155   Hepatic Function Panel     Component Value Date/Time   PROT 6.6 12/30/2018 1155   ALBUMIN 4.3 12/30/2018 1155   AST 21 12/30/2018 1155   ALT 15 12/30/2018 1155   ALKPHOS 101 12/30/2018 1155   BILITOT 0.8 12/30/2018 1155   BILIDIR 0.6 (H) 03/09/2008 0500   IBILI 4.2 (H) 03/06/2008 1500      Component Value Date/Time   TSH 1.610 12/30/2018 1155     03/06/2008 1500    ECG  shows NSR with a rate of 77 BPM. INDIRECT CALORIMETER done today shows a VO2 of 399 and a REE of 2790.  Her calculated basal metabolic rate is 7846 thus her basal  metabolic rate is better than expected.  OBESITY BEHAVIORAL INTERVENTION VISIT  Today's visit was # 1   Starting weight: 393 lbs Starting date: 12/30/2018 Today's weight : Weight: (!) 393 lb (178.3 kg)  Today's date: 12/30/2018 Total lbs lost to date: 0   12/30/2018  Height  (1.549 m)  Weight 393 lb (178.3 kg) (A)  BMI (Calculated) 74.3  BLOOD PRESSURE - SYSTOLIC 111  BLOOD PRESSURE - DIASTOLIC 68  Waist Measurement  52 inches   Body Fat % 71.7 %  RMR 2790    ASK: We discussed the diagnosis of obesity with [name] today and Merryl agreed to give [name] permission to discuss obesity behavioral modification therapy today.  ASSESS: Kasyn has the diagnosis of obesity and her BMI today is 74.3. [place] is in the action stage of change.   ADVISE: Zoha was educated on the multiple health risks of obesity as well as the benefit of weight loss to improve her health. She was advised of the need for long term treatment and the importance of lifestyle modifications to improve her current health and to decrease her risk of future health problems.  AGREE: Multiple dietary modification options and treatment options were discussed and Jozee  agreed to follow the recommendations documented in the above note.  ARRANGE: Berit was educated on the importance of frequent visits to treat obesity as outlined per CMS and USPSTF guidelines and agreed to schedule her next follow up appointment today.  IKirke Corin, CMA, am acting as transcriptionist for Wilder Glade, MD  I have reviewed the above documentation for accuracy and completeness, and I agree with the above. -Quillian Quince, MD

## 2019-01-13 ENCOUNTER — Encounter (INDEPENDENT_AMBULATORY_CARE_PROVIDER_SITE_OTHER): Payer: Self-pay | Admitting: Family Medicine

## 2019-01-13 ENCOUNTER — Ambulatory Visit (INDEPENDENT_AMBULATORY_CARE_PROVIDER_SITE_OTHER): Payer: BLUE CROSS/BLUE SHIELD | Admitting: Family Medicine

## 2019-01-13 ENCOUNTER — Ambulatory Visit (INDEPENDENT_AMBULATORY_CARE_PROVIDER_SITE_OTHER): Payer: BLUE CROSS/BLUE SHIELD | Admitting: Psychology

## 2019-01-13 VITALS — BP 125/64 | HR 80 | Ht 61.0 in | Wt 379.0 lb

## 2019-01-13 DIAGNOSIS — E88819 Insulin resistance, unspecified: Secondary | ICD-10-CM

## 2019-01-13 DIAGNOSIS — Z9189 Other specified personal risk factors, not elsewhere classified: Secondary | ICD-10-CM

## 2019-01-13 DIAGNOSIS — E782 Mixed hyperlipidemia: Secondary | ICD-10-CM | POA: Diagnosis not present

## 2019-01-13 DIAGNOSIS — Z6841 Body Mass Index (BMI) 40.0 and over, adult: Secondary | ICD-10-CM

## 2019-01-13 DIAGNOSIS — E8881 Metabolic syndrome: Secondary | ICD-10-CM | POA: Diagnosis not present

## 2019-01-13 DIAGNOSIS — F3289 Other specified depressive episodes: Secondary | ICD-10-CM

## 2019-01-13 DIAGNOSIS — E559 Vitamin D deficiency, unspecified: Secondary | ICD-10-CM

## 2019-01-13 MED ORDER — VITAMIN D (ERGOCALCIFEROL) 1.25 MG (50000 UNIT) PO CAPS: 50000.0000 [IU] | ORAL_CAPSULE | ORAL | 0 refills | Status: DC
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
## 2019-01-16 NOTE — Progress Notes (Signed)
Office: [phone]  /  Fax: [phone]   HPI:   Chief Complaint: OBESITY Maria Stone is here to discuss her progress with her obesity treatment plan. She is on the Category 4 plan and is following her eating plan approximately 100 % of the time. She states she is exercising 0 minutes 0 times per week. Maria Stone did very well with weight loss on her Category 4 plan. She states her hunger was controlled and she did not feel deprived. She did well with meal planning and asking meals when away from home.  Her weight is (!) 379 lb (171.9 kg) today and has had a weight loss of 14 pounds over a period of 2 weeks since her last visit. She has lost 14 lbs since starting treatment with [name].  Hyperlipidemia (Mixed) Maria Stone has hyperlipidemia and her LDL is elevated. She would like to try to control her cholesterol levels with intensive lifestyle modification including a low saturated fat diet, exercise, and weight loss. She is not on statin and denies any chest pain, claudication or myalgias.  Vitamin D Deficiency Maria Stone has a new diagnosis of vitamin D deficiency. She is not currently taking Vit D, and level is not at goal. She notes fatigue and denies nausea, vomiting or muscle weakness.  Insulin Resistance Maria Stone has a new diagnosis of insulin resistance. She has a normal A1c and glucose, but her fasting insulin is elevated. Although Keiondra's blood glucose readings are still under good control, insulin resistance puts her at greater risk of metabolic syndrome and diabetes. She had polyphagia previously but this improved greatly on her Category 4 plan. She is not taking metformin currently and continues to work on diet and exercise to decrease risk of diabetes.  At risk for diabetes Maria Stone is at higher than average risk for developing diabetes due to her obesity and insulin resistance. She currently denies polyuria or polydipsia.  ASSESSMENT AND PLAN:  Mixed hyperlipidemia  Vitamin D  deficiency - Plan: Vitamin D, Ergocalciferol, (DRISDOL) 1.25 MG (50000 UT) CAPS capsule  Insulin resistance  At risk for diabetes mellitus  Class 3 severe obesity with serious comorbidity and body mass index (BMI) greater than or equal to 70 in adult, unspecified obesity type (HCC)  PLAN:  Hyperlipidemia (Mixed) Maria Stone was informed of the American Heart Association Guidelines emphasizing intensive lifestyle modifications as the first line treatment for hyperlipidemia. We discussed many lifestyle modifications today in depth, and Kabella will continue to work on decreasing saturated fats such as fatty red meat, butter and many fried foods. She will also increase vegetables and lean protein in her diet and continue to work on diet and weight loss efforts. We will recheck labs in 3 months. Maria Stone agrees to follow up with our clinic in 2 to 3 weeks.  Vitamin D Deficiency Maria Stone was informed that low vitamin D levels contributes to fatigue and are associated with obesity, breast, and colon cancer. Tanijah agrees to start prescription Vit D ,000 IU every week #4 with no refills. She will follow up for routine testing of vitamin D, at least 2-3 times per year. She was informed of the risk of over-replacement of vitamin D and agrees to not increase her dose unless she discusses this with [name] first. Kentley agrees to follow up with our clinic in 2 to 3 weeks.  Insulin Resistance Maria Stone will continue to work on weight loss, exercise, and decreasing simple carbohydrates in her diet to help decrease the risk of diabetes. We dicussed metformin including benefits  and risks. She was informed that eating too many simple carbohydrates or too many calories at one sitting increases the likelihood of GI side effects. Ninette declined metformin for now and prescription was not written today. She will continue her diet prescription and we will recheck labs in 3 months. Maria Stone agrees to follow up with our  clinic in 2 to 3 weeks as directed to monitor her progress.  Diabetes risk counseling Maria Stone was given extended (30 minutes) diabetes prevention counseling today. She is 63 y.o. female and has risk factors for diabetes including obesity and insulin resistance. We discussed intensive lifestyle modifications today with an emphasis on weight loss as well as increasing exercise and decreasing simple carbohydrates in her diet.  Obesity Maria Stone is currently in the action stage of change. As such, her goal is to continue with weight loss efforts She has agreed to follow the Category 4 plan Maria Stone has been instructed to work up to a goal of 150 minutes of combined cardio and strengthening exercise per week for weight loss and overall health benefits. We discussed the following Behavioral Modification Strategies today: increasing lean protein intake, decreasing simple carbohydrates, no skipping meals, work on meal planning and easy cooking plans, and better snacking choices   Maria Stone has agreed to follow up with our clinic in 2 to 3 weeks. She was informed of the importance of frequent follow up visits to maximize her success with intensive lifestyle modifications for her multiple health conditions.  ALLERGIES: Allergies  Allergen Reactions  . Nsaids Anaphylaxis  . Tolmetin Anaphylaxis  . Contrast Media [Iodinated Diagnostic Agents] Swelling  . Penicillins Other (See Comments)    Childhood allergy  . Codeine Rash    MEDICATIONS: Current Outpatient Medications on File Prior to Visit  Medication Sig Dispense Refill  . acetaminophen (TYLENOL) 500 MG tablet Take 1,000 mg by mouth every 6 (six) hours as needed for moderate pain or headache.     . Cannabidiol 100 MG/ML SOLN Take by mouth.    . colchicine 0.6 MG tablet Take 0.6 mg by mouth daily.    Marland Kitchen FLUoxetine (PROZAC) 20 MG tablet Take 20 mg by mouth daily.    . folic acid (FOLVITE) 1 MG tablet Take 1 mg by mouth daily.    . furosemide  (LASIX) 40 MG tablet Take 40 mg by mouth daily.    Marland Kitchen gabapentin (NEURONTIN) 300 MG capsule Take 300 mg by mouth 3 (three) times daily.    Marland Kitchen lisinopril (PRINIVIL,ZESTRIL) 10 MG tablet Take 10 mg by mouth daily.     Marland Kitchen loratadine (CLARITIN) 10 MG tablet Take 10 mg by mouth daily.    . Multiple Vitamin (MULTIVITAMIN WITH MINERALS) TABS Take 1 tablet by mouth daily.    . Multiple Vitamins-Minerals (AIRBORNE GUMMIES PO) Take 1 each by mouth daily.    Marland Kitchen PROAIR HFA 108 (90 Base) MCG/ACT inhaler Inhale 2 puffs into the lungs every 6 (six) hours as needed for shortness of breath or wheezing.  3   No current facility-administered medications on file prior to visit.     PAST MEDICAL HISTORY: Past Medical History:  Diagnosis Date  . Anxiety   . Arthritis   . Asthma   . Chronic pain   . Chronic pain   . Family history of adverse reaction to anesthesia    PONV for pt mother  . Heart murmur   . Hemolytic anemia (HCC)   . Hyperlipidemia   . Hypertension   . Joint pain   .  Knee pain, bilateral   . Lower extremity edema   . Mass    right middle finger  . Morbid obesity (HCC)   . Osteoarthritis   . Osteoporosis   . Perirectal abscess   . Venous stasis     PAST SURGICAL HISTORY: Past Surgical History:  Procedure Laterality Date  . COLONOSCOPY    . INCISION AND DRAINAGE PERIRECTAL ABSCESS  05/25/2012   Procedure: IRRIGATION AND DEBRIDEMENT PERIRECTAL ABSCESS;  Surgeon: Valarie Merino, MD;  Location: [facility];  Service: General;  Laterality: N/A;  . MASS EXCISION Right 01/01/2018   Procedure: EXCISION MASS RIGHT MIDDLE FINGER;  Surgeon: Cindee Salt, MD;  Location: [facility];  Service: Orthopedics;  Laterality: Right;    SOCIAL HISTORY: Social History   Tobacco Use  . Smoking status: Former Smoker    Types: Cigarettes  . Smokeless tobacco: Never Used  . Tobacco comment: 07/01/1976  Substance Use Topics  . Alcohol use: No    Frequency: Never  . Drug use: No    FAMILY HISTORY: Family  History  Problem Relation Age of Onset  . Diabetes Father   . Hyperlipidemia Father   . Hypertension Father   . Heart disease Father   . Kidney disease Father   . Obesity Father   . Diabetes Other     ROS: Review of Systems  Constitutional: Positive for malaise/fatigue and weight loss.  Cardiovascular: Negative for chest pain and claudication.  Gastrointestinal: Negative for nausea and vomiting.  Genitourinary: Negative for frequency.  Musculoskeletal: Negative for myalgias.       Negative muscle weakness  Endo/Heme/Allergies: Negative for polydipsia.       Negative polyphagia    PHYSICAL EXAM: Blood pressure 125/64, pulse 80, height 5\' 1"  (1.549 m), weight (!) 379 lb (171.9 kg), SpO2 98 %. Body mass index is 71.61 kg/m. Physical Exam Vitals signs reviewed.  Constitutional:      Appearance: Normal appearance. She is obese.  Cardiovascular:     Rate and Rhythm: Normal rate.     Pulses: Normal pulses.  Pulmonary:     Effort: Pulmonary effort is normal.     Breath sounds: Normal breath sounds.  Musculoskeletal: Normal range of motion.  Skin:    General: Skin is warm and dry.  Neurological:     Mental Status: She is alert and oriented to person, place, and time.  Psychiatric:        Mood and Affect: Mood normal.        Behavior: Behavior normal.     RECENT LABS AND TESTS: BMET    Component Value Date/Time   NA 140 12/30/2018 1155   K 4.6 12/30/2018 1155   CL 101 12/30/2018 1155   CO2 24 12/30/2018 1155   GLUCOSE 89 12/30/2018 1155   GLUCOSE 93 01/01/2018 1312   BUN 29 (H) 12/30/2018 1155   CREATININE 1.14 (H) 12/30/2018 1155   CALCIUM 9.7 12/30/2018 1155   GFRNONAA 51 (L) 12/30/2018 1155   GFRAA 59 (L) 12/30/2018 1155   Lab Results  Component Value Date   HGBA1C 5.4 12/30/2018   HGBA1C (L) 03/08/2008    3.6 (NOTE)   The ADA recommends the following therapeutic goals for glycemic   control related to Hgb A1C measurement:   Goal of Therapy:   < 7.0% Hgb  A1C   Action Suggested:  > 8.0% Hgb A1C   Ref:  Diabetes Care, 22, Suppl. 1, 1999   Lab Results  Component Value Date  INSULIN 16.9 12/30/2018   CBC    Component Value Date/Time   WBC 6.0 12/30/2018 1155   WBC 6.2 01/01/2018 1215   RBC 4.44 12/30/2018 1155   RBC 4.38 01/01/2018 1215   HGB 13.9 12/30/2018 1155   HGB 15.2 12/12/2008 0804   HCT 41.4 12/30/2018 1155   HCT 43.8 12/12/2008 0804   PLT 161 01/01/2018 1215   PLT 175 12/12/2008 0804   MCV 93 12/30/2018 1155   MCV 90.8 12/12/2008 0804   MCH 31.3 12/30/2018 1155   MCH 31.5 01/01/2018 1215   MCHC 33.6 12/30/2018 1155   MCHC 32.0 01/01/2018 1215   RDW 11.7 12/30/2018 1155   RDW 13.6 12/12/2008 0804   LYMPHSABS 1.4 12/30/2018 1155   LYMPHSABS 1.5 12/12/2008 0804   MONOABS 1.3 (H) 05/20/2012 1348   MONOABS 0.5 12/12/2008 0804   EOSABS 0.1 12/30/2018 1155   BASOSABS 0.1 12/30/2018 1155   BASOSABS 0.0 12/12/2008 0804   Iron/TIBC/Ferritin/ %Sat    Component Value Date/Time   IRON 76 07/21/2008 0753   TIBC 295 07/21/2008 0753   FERRITIN 219 07/21/2008 0753   IRONPCTSAT 26 07/21/2008 0753   Lipid Panel     Component Value Date/Time   CHOL 243 (H) 12/30/2018 1155   TRIG 137 12/30/2018 1155   HDL 47 12/30/2018 1155   CHOLHDL 7.3 03/07/2008 0525   VLDL 38 03/07/2008 0525   LDLCALC 169 (H) 12/30/2018 1155   Hepatic Function Panel     Component Value Date/Time   PROT 6.6 12/30/2018 1155   ALBUMIN 4.3 12/30/2018 1155   AST 21 12/30/2018 1155   ALT 15 12/30/2018 1155   ALKPHOS 101 12/30/2018 1155   BILITOT 0.8 12/30/2018 1155   BILIDIR 0.6 (H) 03/09/2008 0500   IBILI 4.2 (H) 03/06/2008 1500       OBESITY BEHAVIORAL INTERVENTION VISIT  Today's visit was # 2   Starting weight: 393 lbs Starting date: 12/30/2018 Today's weight : 379 lbs Today's date: 01/13/2019 Total lbs lost to date: 14    01/13/2019  Height 5\' 1"  (1.549 m)  Weight 379 lb (171.9 kg) (A)  BMI (Calculated) 71.65  BLOOD PRESSURE -  SYSTOLIC 125  BLOOD PRESSURE - DIASTOLIC 64   Body Fat % 68.9 %     ASK: We discussed the diagnosis of obesity with Maria Stone today and Maria Stone agreed to give [name] permission to discuss obesity behavioral modification therapy today.  ASSESS: Maria Stone has the diagnosis of obesity and her BMI today is 71.65 Maria Stone is in the action stage of change   ADVISE: Maria Stone was educated on the multiple health risks of obesity as well as the benefit of weight loss to improve her health. She was advised of the need for long term treatment and the importance of lifestyle modifications to improve her current health and to decrease her risk of future health problems.  AGREE: Multiple dietary modification options and treatment options were discussed and  Maria Stone agreed to follow the recommendations documented in the above note.  ARRANGE: Maria Stone was educated on the importance of frequent visits to treat obesity as outlined per CMS and USPSTF guidelines and agreed to schedule her next follow up appointment today.  I, Burt Knack, am acting as transcriptionist for Quillian Quince, MD  I have reviewed the above documentation for accuracy and completeness, and I agree with the above. -Quillian Quince, MD

## 2019-01-18 ENCOUNTER — Other Ambulatory Visit (INDEPENDENT_AMBULATORY_CARE_PROVIDER_SITE_OTHER): Payer: Self-pay | Admitting: Family Medicine

## 2019-01-18 DIAGNOSIS — E559 Vitamin D deficiency, unspecified: Secondary | ICD-10-CM

## 2019-02-02 ENCOUNTER — Other Ambulatory Visit (INDEPENDENT_AMBULATORY_CARE_PROVIDER_SITE_OTHER): Payer: Self-pay | Admitting: Family Medicine

## 2019-02-02 DIAGNOSIS — E559 Vitamin D deficiency, unspecified: Secondary | ICD-10-CM

## 2019-02-02 NOTE — Progress Notes (Addendum)
Office: [phone]  /  Fax: [phone]    Date: February 03, 2019  Appointment Start Time: 11:00am Duration: 20 minutes Provider: Lawerance Cruel, Psy.D. Type of Session: Individual Therapy  Location of Patient: Home Location of Provider: Office  Type of Contact: Telepsychological Visit via [employer]   Session Content: Prior to initiating teletherapy services, Maria Stone was provided with an informed consent document for telepsychological services, which included the development of a safety plan in the event of an emergency/crisis. Maria Stone explained she was unable to print, sign, and return the completed informed consent. As such, this provider verbally discussed the informed consent for telepsychological services, and Maria Stone verbally consented. She also provided an emergency contact and noted her nearest emergency room. Notably, Maria Stone agreed to send this provider a Mychart message indicating she consents for telepsychological services to serve as her written consent at this time. Maria Stone acknowledged understanding that all MyChart messages are visible to all providers as they are part of the electronic medical record. In addition, this provider explained the telepsychological services informed consent document would be considered an addendum to the  initial consent document.    Today's appointment was a telepsychological visit, as this provider's clinic is closed for in-person visits due to COVID-19. Therapeutic services will resume to in-person appointments once the clinic re-opens. Maria Stone expressed understanding regarding the rationale for telepsychological services. Of this, this provided also verbally addressed that Maria Stone is ultimately responsible for understanding her insurance benefits as it relates to reimbursement of telepsychological services. Maria Stone acknowledged understanding, and verbally consented to proceed. Additionally, prior to proceeding with today's appointment,  Maria Stone's physical location at the time of this appointment was obtained. Maria Stone reported she is at [address] [place], [address], which is Maria Stone's home. In the event of technical difficulties, Maria Stone stated she can be reached at the following phone number: [phone]. Carroll and this provider participated in today's telepsychological service. Also, Dameria denied anyone else being present in her room or on the videoconference call.   Maria Stone is a 63 y.o. female presenting via [employer] for a follow-up appointment to address the previously established treatment goal of decreasing emotional eating. The session was initiated with the verbal administration of the PHQ-9 and GAD-7, as well as a brief check-in. Maria Stone reported things are going "really good" and the "program is working for [her]." Since starting with this clinic, she noted physically feeling better, including increased mobility. As it relates to COVID-19, she noted concerns; however, denied any issues related to it. Regarding eating, she discussed an increase in comfort as it relates to making choices and shared that since starting the structured meal plan, her eating is "more routine." Since the last appointment with this provider, Maria Stone denied episodes of emotional eating. Nevertheless, psychoeducation regarding triggers for emotional eating was provided. Maria Stone was encouraged to utilize the handout to increase awareness of triggers and frequency. Maria Stone agreed. This provider also discussed behavioral strategies for specific triggers, such as placing the utensil down when conversing to avoid mindless eating. This provider sent Maria Stone a MyChart message with a handout for triggers. Prior to sending the message, this provider explained the message would be visible to all providers, as it would be part of the electronic medical record. Maria Stone verbally acknowledged understanding, and verbally consented to this provider  sending the MyChart message. Maria Stone was receptive to today's session as evidenced by openness to sharing, responsiveness to feedback, and willingness to identify triggers for emotional eating.  Mental Status Examination:  Appearance: neat  Behavior: cooperative Mood: euthymic Affect: mood congruent Speech: normal in rate, volume, and tone Eye Contact: appropriate Psychomotor Activity: appropriate Thought Process: linear, logical, and goal directed  Content/Perceptual Disturbances: denies suicidal and homicidal ideation, plan, and intent and no hallucinations, delusions, bizarre thinking or behavior reported or observed Orientation: time, person, place and purpose of appointment Cognition/Sensorium: memory, attention, language, and fund of knowledge intact  Insight: good Judgment: good  Structured Assessment Results: The Patient Health Questionnaire-9 (PHQ-9) is a self-report measure that assesses symptoms and severity of depression over the course of the last two weeks. Maria Stone obtained a score of zero. Depression screen Maria Stone 2/9 02/03/2019  Decreased Interest 0  Down, Depressed, Hopeless 0  PHQ - 2 Score 0  Altered sleeping 0  Tired, decreased energy 0  Change in appetite 0  Feeling bad or failure about yourself  0  Trouble concentrating 0  Moving slowly or fidgety/restless 0  Suicidal thoughts 0  PHQ-9 Score 0  Difficult doing work/chores -   The Generalized Anxiety Disorder-7 (GAD-7) is a brief self-report measure that assesses symptoms of anxiety over the course of the last two weeks. Maria Stone obtained a score of zero. Depression screen Maria Stone 2/9 02/03/2019  Decreased Interest 0  Down, Depressed, Hopeless 0  PHQ - 2 Score 0  Altered sleeping 0  Tired, decreased energy 0  Change in appetite 0  Feeling bad or failure about yourself  0  Trouble concentrating 0  Moving slowly or fidgety/restless 0  Suicidal thoughts 0  PHQ-9 Score 0  Difficult doing work/chores -    Interventions:  Administration of PHQ-9 and GAD-7 for symptom monitoring Review of content from the previous session Empathic reflections and validation Psychoeducation regarding triggers for emotional eating Positive reinforcement Rapport building Brief chart review  DSM-5 Diagnosis: 311 (F32.8) Other Specified Depressive Disorder, Emotional Eating Behaviors  Treatment Goal & Progress: During the initial appointment with this provider, the following treatment goal was established: decrease emotional eating. Progress is limited, as Maria Stone has just begun treatment with this provider. Nevertheless, Maria Stone has demonstrated some progress in her goal as evidenced by increased awareness of hunger patterns.  Plan: At this time, Maria Stone declined future appointments with this provider and noted, "I think we're good." She expressed understanding that should she wish to reinitiate services with this provider, she may call the clinic.  Addendum:  The following was sent via a Mychart message by Maria Stone Flattery on 02/03/2019 consenting for telepsychological services:  Thank you Dr. Dewaine Conger. I appreciate all your help and guidance.  Please accept this message to acknowledge my authorization for tele psychological meetings with me.  Respectfully  Kalman Drape

## 2019-02-03 ENCOUNTER — Encounter (INDEPENDENT_AMBULATORY_CARE_PROVIDER_SITE_OTHER): Payer: Self-pay

## 2019-02-03 ENCOUNTER — Ambulatory Visit (INDEPENDENT_AMBULATORY_CARE_PROVIDER_SITE_OTHER): Payer: BLUE CROSS/BLUE SHIELD | Admitting: Psychology

## 2019-02-03 ENCOUNTER — Ambulatory Visit (INDEPENDENT_AMBULATORY_CARE_PROVIDER_SITE_OTHER): Payer: BLUE CROSS/BLUE SHIELD | Admitting: Family Medicine

## 2019-02-03 ENCOUNTER — Encounter (INDEPENDENT_AMBULATORY_CARE_PROVIDER_SITE_OTHER): Payer: Self-pay | Admitting: Family Medicine

## 2019-02-03 ENCOUNTER — Other Ambulatory Visit: Payer: Self-pay

## 2019-02-03 DIAGNOSIS — Z6841 Body Mass Index (BMI) 40.0 and over, adult: Secondary | ICD-10-CM

## 2019-02-03 DIAGNOSIS — E559 Vitamin D deficiency, unspecified: Secondary | ICD-10-CM

## 2019-02-03 DIAGNOSIS — F3289 Other specified depressive episodes: Secondary | ICD-10-CM | POA: Diagnosis not present

## 2019-02-03 MED ORDER — VITAMIN D (ERGOCALCIFEROL) 1.25 MG (50000 UNIT) PO CAPS: 50000.0000 [IU] | ORAL_CAPSULE | ORAL | 0 refills | Status: DC

## 2019-02-07 NOTE — Progress Notes (Signed)
Office: [phone]  /  Fax: [phone] TeleHealth Visit:  Maria Stone has consented to this TeleHealth visit today via Face Time. The patient is located at home, the provider is located at the [facility] office. The participants in this visit include the listed provider and patient.   HPI:   Chief Complaint: OBESITY Maria Stone is here to discuss her progress with her obesity treatment plan. She is on the Category 4 plan and is following her eating plan approximately 95 % of the time. She states she is exercising 0 minutes 0 times per week. Maria Stone continues to do well with her diet. She is eating most of her food and she feels that her hunger is controlled. She does not have a scale at home, but she feels that her clothes are looser and she is moving around a bit easier.  We were unable to weigh the patient today for this TeleHealth visit. She feels as if she has lost weight since her last visit. She has lost 14 lbs since starting treatment with [name].  Vitamin D Deficiency Maria Stone has a diagnosis of vitamin D deficiency. She is currently stable on vit D, but is not yet at goal. Maria Stone notes that fatigue is starting to improve and denies nausea, vomiting, or muscle weakness.  ASSESSMENT AND PLAN:  Vitamin D deficiency - Plan: Vitamin D, Ergocalciferol, (DRISDOL) 1.25 MG (50000 UT) CAPS capsule  Class 3 severe obesity with serious comorbidity and body mass index (BMI) greater than or equal to 70 in adult, unspecified obesity type (HCC)  PLAN:  Vitamin D Deficiency Maria Stone was informed that low vitamin D levels contribute to fatigue and are associated with obesity, breast, and colon cancer. Maria Stone agrees to continue to take prescription Vit D @50 ,000 IU every week #4 with no refills and will follow up for routine testing of vitamin D, at least 2-3 times per year. She was informed of the risk of over-replacement of vitamin D and agrees to not increase her dose  unless she discusses this with [name] first. Maria Stone agrees to follow up in 3 weeks as directed.  Obesity Maria Stone is currently in the action stage of change. As such, her goal is to continue with weight loss efforts. She has agreed to follow the Category 4 plan. Maria Stone has been instructed to work up to a goal of 150 minutes of combined cardio and strengthening exercise per week for weight loss and overall health benefits. We discussed the following Behavioral Modification Strategies today: work on meal planning and easy cooking plans, emotional eating strategies, ways to avoid boredom eating, keeping healthy foods in the home, better snacking choices, and ways to avoid night time snacking.  Maria Stone has agreed to follow up with our clinic in 3 weeks. She was informed of the importance of frequent follow up visits to maximize her success with intensive lifestyle modifications for her multiple health conditions.  ALLERGIES: Allergies  Allergen Reactions  . Nsaids Anaphylaxis  . Tolmetin Anaphylaxis  . Contrast Media [Iodinated Diagnostic Agents] Swelling  . Penicillins Other (See Comments)    Childhood allergy  . Codeine Rash    MEDICATIONS: Current Outpatient Medications on File Prior to Visit  Medication Sig Dispense Refill  . acetaminophen (TYLENOL) 500 MG tablet Take 1,000 mg by mouth every 6 (six) hours as needed for moderate pain or headache.     . Cannabidiol 100 MG/ML SOLN Take by mouth.    . colchicine 0.6 MG tablet Take 0.6 mg  by mouth daily.    Marland Kitchen FLUoxetine (PROZAC) 20 MG tablet Take 20 mg by mouth daily.    . folic acid (FOLVITE) 1 MG tablet Take 1 mg by mouth daily.    . furosemide (LASIX) 40 MG tablet Take 40 mg by mouth daily.    Marland Kitchen gabapentin (NEURONTIN) 300 MG capsule Take 300 mg by mouth 3 (three) times daily.    Marland Kitchen lisinopril (PRINIVIL,ZESTRIL) 10 MG tablet Take 10 mg by mouth daily.     Marland Kitchen loratadine (CLARITIN) 10 MG tablet Take 10 mg by mouth daily.    . Multiple  Vitamin (MULTIVITAMIN WITH MINERALS) TABS Take 1 tablet by mouth daily.    . Multiple Vitamins-Minerals (AIRBORNE GUMMIES PO) Take 1 each by mouth daily.    Marland Kitchen PROAIR HFA 108 (90 Base) MCG/ACT inhaler Inhale 2 puffs into the lungs every 6 (six) hours as needed for shortness of breath or wheezing.  3   No current facility-administered medications on file prior to visit.     PAST MEDICAL HISTORY: Past Medical History:  Diagnosis Date  . Anxiety   . Arthritis   . Asthma   . Chronic pain   . Chronic pain   . Family history of adverse reaction to anesthesia    PONV for pt mother  . Heart murmur   . Hemolytic anemia (HCC)   . Hyperlipidemia   . Hypertension   . Joint pain   . Knee pain, bilateral   . Lower extremity edema   . Mass    right middle finger  . Morbid obesity (HCC)   . Osteoarthritis   . Osteoporosis   . Perirectal abscess   . Venous stasis     PAST SURGICAL HISTORY: Past Surgical History:  Procedure Laterality Date  . COLONOSCOPY    . INCISION AND DRAINAGE PERIRECTAL ABSCESS  05/25/2012   Procedure: IRRIGATION AND DEBRIDEMENT PERIRECTAL ABSCESS;  Surgeon: Valarie Merino, MD;  Location: [facility];  Service: General;  Laterality: N/A;  . MASS EXCISION Right 01/01/2018   Procedure: EXCISION MASS RIGHT MIDDLE FINGER;  Surgeon: Cindee Salt, MD;  Location: [facility];  Service: Orthopedics;  Laterality: Right;    SOCIAL HISTORY: Social History   Tobacco Use  . Smoking status: Former Smoker    Types: Cigarettes  . Smokeless tobacco: Never Used  . Tobacco comment: 07/01/1976  Substance Use Topics  . Alcohol use: No    Frequency: Never  . Drug use: No    FAMILY HISTORY: Family History  Problem Relation Age of Onset  . Diabetes Father   . Hyperlipidemia Father   . Hypertension Father   . Heart disease Father   . Kidney disease Father   . Obesity Father   . Diabetes Other     ROS: Review of Systems  Constitutional: Positive for malaise/fatigue.    PHYSICAL  EXAM: Pt in no acute distress  RECENT LABS AND TESTS: BMET    Component Value Date/Time   NA 140 12/30/2018 1155   K 4.6 12/30/2018 1155   CL 101 12/30/2018 1155   CO2 24 12/30/2018 1155   GLUCOSE 89 12/30/2018 1155   GLUCOSE 93 01/01/2018 1312   BUN 29 (H) 12/30/2018 1155   CREATININE 1.14 (H) 12/30/2018 1155   CALCIUM 9.7 12/30/2018 1155   GFRNONAA 51 (L) 12/30/2018 1155   GFRAA 59 (L) 12/30/2018 1155   Lab Results  Component Value Date   HGBA1C 5.4 12/30/2018   HGBA1C (L) 03/08/2008  3.6 (NOTE)   The ADA recommends the following therapeutic goals for glycemic   control related to Hgb A1C measurement:   Goal of Therapy:   < 7.0% Hgb A1C   Action Suggested:  > 8.0% Hgb A1C   Ref:  Diabetes Care, 22, Suppl. 1, 1999   Lab Results  Component Value Date   INSULIN 16.9 12/30/2018   CBC    Component Value Date/Time   WBC 6.0 12/30/2018 1155   WBC 6.2 01/01/2018 1215   RBC 4.44 12/30/2018 1155   RBC 4.38 01/01/2018 1215   HGB 13.9 12/30/2018 1155   HGB 15.2 12/12/2008 0804   HCT 41.4 12/30/2018 1155   HCT 43.8 12/12/2008 0804   PLT 161 01/01/2018 1215   PLT 175 12/12/2008 0804   MCV 93 12/30/2018 1155   MCV 90.8 12/12/2008 0804   MCH 31.3 12/30/2018 1155   MCH 31.5 01/01/2018 1215   MCHC 33.6 12/30/2018 1155   MCHC 32.0 01/01/2018 1215   RDW 11.7 12/30/2018 1155   RDW 13.6 12/12/2008 0804   LYMPHSABS 1.4 12/30/2018 1155   LYMPHSABS 1.5 12/12/2008 0804   MONOABS 1.3 (H) 05/20/2012 1348   MONOABS 0.5 12/12/2008 0804   EOSABS 0.1 12/30/2018 1155   BASOSABS 0.1 12/30/2018 1155   BASOSABS 0.0 12/12/2008 0804   Iron/TIBC/Ferritin/ %Sat    Component Value Date/Time   IRON 76 07/21/2008 0753   TIBC 295 07/21/2008 0753   FERRITIN 219 07/21/2008 0753   IRONPCTSAT 26 07/21/2008 0753   Lipid Panel     Component Value Date/Time   CHOL 243 (H) 12/30/2018 1155   TRIG 137 12/30/2018 1155   HDL 47 12/30/2018 1155   CHOLHDL 7.3 03/07/2008 0525   VLDL 38 03/07/2008  0525   LDLCALC 169 (H) 12/30/2018 1155   Hepatic Function Panel     Component Value Date/Time   PROT 6.6 12/30/2018 1155   ALBUMIN 4.3 12/30/2018 1155   AST 21 12/30/2018 1155   ALT 15 12/30/2018 1155   ALKPHOS 101 12/30/2018 1155   BILITOT 0.8 12/30/2018 1155   BILIDIR 0.6 (H) 03/09/2008 0500   IBILI 4.2 (H) 03/06/2008 1500    Results for BELISSA, TANNOUS (MRN [id number]) as of 02/07/2019 09:45  Ref. Range 12/30/2018 11:55  Vitamin D, 25-Hydroxy Latest Ref Range: 30.0 - 100.0 ng/mL 29.8 (L)    I, Kirke Corin, CMA, am acting as transcriptionist for Wilder Glade, MD I have reviewed the above documentation for accuracy and completeness, and I agree with the above. -Quillian Quince, MD

## 2019-02-22 ENCOUNTER — Other Ambulatory Visit: Payer: Self-pay

## 2019-02-22 ENCOUNTER — Ambulatory Visit (INDEPENDENT_AMBULATORY_CARE_PROVIDER_SITE_OTHER): Payer: BLUE CROSS/BLUE SHIELD | Admitting: Family Medicine

## 2019-02-22 ENCOUNTER — Encounter (INDEPENDENT_AMBULATORY_CARE_PROVIDER_SITE_OTHER): Payer: Self-pay | Admitting: Family Medicine

## 2019-02-22 DIAGNOSIS — Z6841 Body Mass Index (BMI) 40.0 and over, adult: Secondary | ICD-10-CM | POA: Diagnosis not present

## 2019-02-22 DIAGNOSIS — F3289 Other specified depressive episodes: Secondary | ICD-10-CM

## 2019-02-22 DIAGNOSIS — E559 Vitamin D deficiency, unspecified: Secondary | ICD-10-CM | POA: Diagnosis not present

## 2019-02-22 MED ORDER — VITAMIN D (ERGOCALCIFEROL) 1.25 MG (50000 UNIT) PO CAPS: 50000.0000 [IU] | ORAL_CAPSULE | ORAL | 0 refills | Status: DC

## 2019-02-22 NOTE — Progress Notes (Signed)
Office: [phone]  /  Fax: [phone] TeleHealth Visit:  Maria Stone has verbally consented to this TeleHealth visit today. The patient is located at home, the provider is located at the [facility] office. The participants in this visit include the listed provider and patient. Maria Stone was unable to use Face Time today and the Telehealth visit was conducted via telephone.   HPI:   Chief Complaint: OBESITY Maria Stone is here to discuss her progress with her obesity treatment plan. She is on the Category 4 plan and is following her eating plan approximately 100 % of the time. She states she is moving more. Maria Stone continues to do well with weight loss. Her clothes are fitting looser and she no longer needs her car's seat belt extender. She states that her hunger is pretty controlled and she is happy with her plan.  We were unable to weigh the patient today for this TeleHealth visit. She feels as if she has lost weight since her last visit. She has lost 14 lbs since starting treatment with [name].  Vitamin D Deficiency Maria Stone has a diagnosis of vitamin D deficiency. She is currently stable on vit D, but is not yet at goal. Maria Stone denies nausea, vomiting, or muscle weakness.  Depression with emotional eating behaviors Maria Stone's mood is doing well on Prozac. She is dealing with isolation productivity, sleeping well, and avoiding emotional eating. She has been working on behavior modification techniques to help reduce her emotional eating and has been somewhat successful.   Depression screen [facility] 2/9 02/03/2019 01/13/2019 12/30/2018  Decreased Interest 0 0 2  Down, Depressed, Hopeless 0 0 1  PHQ - 2 Score 0 0 3  Altered sleeping 0 0 0  Tired, decreased energy 0 0 1  Change in appetite 0 0 1  Feeling bad or failure about yourself  0 0 2  Trouble concentrating 0 0 1  Moving slowly or fidgety/restless 0 0 0  Suicidal thoughts 0 0 0  PHQ-9 Score 0 0 8  Difficult  doing work/chores - - Not difficult at all   ASSESSMENT AND PLAN:  Vitamin D deficiency - Plan: Vitamin D, Ergocalciferol, (DRISDOL) 1.25 MG (50000 UT) CAPS capsule  Other depression - with emotional eating  Class 3 severe obesity with serious comorbidity and body mass index (BMI) greater than or equal to 70 in adult, unspecified obesity type (HCC)  PLAN:  Vitamin D Deficiency Maria Stone was informed that low vitamin D levels contribute to fatigue and are associated with obesity, breast, and colon cancer. Maria Stone agrees to continue to take prescription Vit D @50 ,000 IU every week #4 with no refills and will follow up for routine testing of vitamin D, at least 2-3 times per year. She was informed of the risk of over-replacement of vitamin D and agrees to not increase her dose unless she discusses this with [name] first. Maria Stone agrees to follow up in 2 weeks as directed.  Depression with Emotional Eating Behaviors We discussed behavior modification techniques today to help Maria Stone deal with her emotional eating and depression. She has agreed to continue to take Prozac as prescribed and she was offered comfort and support. Maria Stone agreed to follow up as directed.  Obesity Maria Stone is currently in the action stage of change. As such, her goal is to continue with weight loss efforts. She has agreed to follow the Category 4 plan. Maria Stone has been instructed to work up to a goal of 150 minutes of combined cardio and  strengthening exercise per week for weight loss and overall health benefits. We discussed the following Behavioral Modification Strategies today: work on meal planning and easy cooking plans, keeping healthy foods in the home, and ways to avoid boredom eating.  Maria FlatteryMarybeth has agreed to follow up with our clinic in 2 weeks. She was informed of the importance of frequent follow up visits to maximize her success with intensive lifestyle modifications for her multiple health  conditions.  ALLERGIES: Allergies  Allergen Reactions  . Nsaids Anaphylaxis  . Tolmetin Anaphylaxis  . Contrast Media [Iodinated Diagnostic Agents] Swelling  . Penicillins Other (See Comments)    Childhood allergy  . Codeine Rash    MEDICATIONS: Current Outpatient Medications on File Prior to Visit  Medication Sig Dispense Refill  . acetaminophen (TYLENOL) 500 MG tablet Take 1,000 mg by mouth every 6 (six) hours as needed for moderate pain or headache.     . Cannabidiol 100 MG/ML SOLN Take by mouth.    . colchicine 0.6 MG tablet Take 0.6 mg by mouth daily.    Marland Kitchen. FLUoxetine (PROZAC) 20 MG tablet Take 20 mg by mouth daily.    . folic acid (FOLVITE) 1 MG tablet Take 1 mg by mouth daily.    . furosemide (LASIX) 40 MG tablet Take 40 mg by mouth daily.    Marland Kitchen. gabapentin (NEURONTIN) 300 MG capsule Take 300 mg by mouth 3 (three) times daily.    Marland Kitchen. lisinopril (PRINIVIL,ZESTRIL) 10 MG tablet Take 10 mg by mouth daily.     Marland Kitchen. loratadine (CLARITIN) 10 MG tablet Take 10 mg by mouth daily.    . Multiple Vitamin (MULTIVITAMIN WITH MINERALS) TABS Take 1 tablet by mouth daily.    . Multiple Vitamins-Minerals (AIRBORNE GUMMIES PO) Take 1 each by mouth daily.    Marland Kitchen. PROAIR HFA 108 (90 Base) MCG/ACT inhaler Inhale 2 puffs into the lungs every 6 (six) hours as needed for shortness of breath or wheezing.  3   No current facility-administered medications on file prior to visit.     PAST MEDICAL HISTORY: Past Medical History:  Diagnosis Date  . Anxiety   . Arthritis   . Asthma   . Chronic pain   . Chronic pain   . Family history of adverse reaction to anesthesia    PONV for pt mother  . Heart murmur   . Hemolytic anemia (HCC)   . Hyperlipidemia   . Hypertension   . Joint pain   . Knee pain, bilateral   . Lower extremity edema   . Mass    right middle finger  . Morbid obesity (HCC)   . Osteoarthritis   . Osteoporosis   . Perirectal abscess   . Venous stasis     PAST SURGICAL HISTORY: Past  Surgical History:  Procedure Laterality Date  . COLONOSCOPY    . INCISION AND DRAINAGE PERIRECTAL ABSCESS  05/25/2012   Procedure: IRRIGATION AND DEBRIDEMENT PERIRECTAL ABSCESS;  Surgeon: Valarie MerinoMatthew B Martin, MD;  Location: [facility];  Service: General;  Laterality: N/A;  . MASS EXCISION Right 01/01/2018   Procedure: EXCISION MASS RIGHT MIDDLE FINGER;  Surgeon: Cindee SaltKuzma, Gary, MD;  Location: [facility];  Service: Orthopedics;  Laterality: Right;    SOCIAL HISTORY: Social History   Tobacco Use  . Smoking status: Former Smoker    Types: Cigarettes  . Smokeless tobacco: Never Used  . Tobacco comment: 07/01/1976  Substance Use Topics  . Alcohol use: No    Frequency: Never  . Drug use:  No    FAMILY HISTORY: Family History  Problem Relation Age of Onset  . Diabetes Father   . Hyperlipidemia Father   . Hypertension Father   . Heart disease Father   . Kidney disease Father   . Obesity Father   . Diabetes Other     ROS: Review of Systems  Gastrointestinal: Negative for nausea and vomiting.  Musculoskeletal:       Negative for muscle weakness.  Psychiatric/Behavioral: Positive for depression.    PHYSICAL EXAM: Pt in no acute distress  RECENT LABS AND TESTS: BMET    Component Value Date/Time   NA 140 12/30/2018 1155   K 4.6 12/30/2018 1155   CL 101 12/30/2018 1155   CO2 24 12/30/2018 1155   GLUCOSE 89 12/30/2018 1155   GLUCOSE 93 01/01/2018 1312   BUN 29 (H) 12/30/2018 1155   CREATININE 1.14 (H) 12/30/2018 1155   CALCIUM 9.7 12/30/2018 1155   GFRNONAA 51 (L) 12/30/2018 1155   GFRAA 59 (L) 12/30/2018 1155   Lab Results  Component Value Date   HGBA1C 5.4 12/30/2018   HGBA1C (L) 03/08/2008    3.6 (NOTE)   The ADA recommends the following therapeutic goals for glycemic   control related to Hgb A1C measurement:   Goal of Therapy:   < 7.0% Hgb A1C   Action Suggested:  > 8.0% Hgb A1C   Ref:  Diabetes Care, 22, Suppl. 1, 1999   Lab Results  Component Value Date   INSULIN 16.9  12/30/2018   CBC    Component Value Date/Time   WBC 6.0 12/30/2018 1155   WBC 6.2 01/01/2018 1215   RBC 4.44 12/30/2018 1155   RBC 4.38 01/01/2018 1215   HGB 13.9 12/30/2018 1155   HGB 15.2 12/12/2008 0804   HCT 41.4 12/30/2018 1155   HCT 43.8 12/12/2008 0804   PLT 161 01/01/2018 1215   PLT 175 12/12/2008 0804   MCV 93 12/30/2018 1155   MCV 90.8 12/12/2008 0804   MCH 31.3 12/30/2018 1155   MCH 31.5 01/01/2018 1215   MCHC 33.6 12/30/2018 1155   MCHC 32.0 01/01/2018 1215   RDW 11.7 12/30/2018 1155   RDW 13.6 12/12/2008 0804   LYMPHSABS 1.4 12/30/2018 1155   LYMPHSABS 1.5 12/12/2008 0804   MONOABS 1.3 (H) 05/20/2012 1348   MONOABS 0.5 12/12/2008 0804   EOSABS 0.1 12/30/2018 1155   BASOSABS 0.1 12/30/2018 1155   BASOSABS 0.0 12/12/2008 0804   Iron/TIBC/Ferritin/ %Sat    Component Value Date/Time   IRON 76 07/21/2008 0753   TIBC 295 07/21/2008 0753   FERRITIN 219 07/21/2008 0753   IRONPCTSAT 26 07/21/2008 0753   Lipid Panel     Component Value Date/Time   CHOL 243 (H) 12/30/2018 1155   TRIG 137 12/30/2018 1155   HDL 47 12/30/2018 1155   CHOLHDL 7.3 03/07/2008 0525   VLDL 38 03/07/2008 0525   LDLCALC 169 (H) 12/30/2018 1155   Hepatic Function Panel     Component Value Date/Time   PROT 6.6 12/30/2018 1155   ALBUMIN 4.3 12/30/2018 1155   AST 21 12/30/2018 1155   ALT 15 12/30/2018 1155   ALKPHOS 101 12/30/2018 1155   BILITOT 0.8 12/30/2018 1155   BILIDIR 0.6 (H) 03/09/2008 0500   IBILI 4.2 (H) 03/06/2008 1500      Component Value Date/Time   TSH 1.610 12/30/2018 1155   TSH 0.927   Test methodology is 3rd generation TSH 03/06/2008 1500   Results for Mcgirr, Yaritza K (  MRN [id number]) as of 02/22/2019 15:15  Ref. Range 12/30/2018 11:55  Vitamin D, 25-Hydroxy Latest Ref Range: 30.0 - 100.0 ng/mL 29.8 (L)     I, Kirke Corin, CMA, am acting as transcriptionist for Wilder Glade, MD I have reviewed the above documentation for accuracy and completeness, and  I agree with the above. -Quillian Quince, MD

## 2019-02-26 ENCOUNTER — Other Ambulatory Visit (INDEPENDENT_AMBULATORY_CARE_PROVIDER_SITE_OTHER): Payer: Self-pay | Admitting: Family Medicine

## 2019-02-26 DIAGNOSIS — E559 Vitamin D deficiency, unspecified: Secondary | ICD-10-CM

## 2019-03-15 ENCOUNTER — Encounter (INDEPENDENT_AMBULATORY_CARE_PROVIDER_SITE_OTHER): Payer: Self-pay | Admitting: Family Medicine

## 2019-03-15 ENCOUNTER — Other Ambulatory Visit: Payer: Self-pay

## 2019-03-15 ENCOUNTER — Ambulatory Visit (INDEPENDENT_AMBULATORY_CARE_PROVIDER_SITE_OTHER): Payer: BLUE CROSS/BLUE SHIELD | Admitting: Family Medicine

## 2019-03-15 DIAGNOSIS — F3289 Other specified depressive episodes: Secondary | ICD-10-CM | POA: Diagnosis not present

## 2019-03-15 DIAGNOSIS — Z6841 Body Mass Index (BMI) 40.0 and over, adult: Secondary | ICD-10-CM | POA: Diagnosis not present

## 2019-03-15 NOTE — Progress Notes (Signed)
Office: [phone]  /  Fax: [phone] TeleHealth Visit:  Maria Stone has verbally consented to this TeleHealth visit today. The patient is located at home, the provider is located at the [facility] office. The participants in this visit include the listed provider and patient. The visit was conducted today via Face Time.  HPI:   Chief Complaint: OBESITY Maria Stone is here to discuss her progress with her obesity treatment plan. She is on the Category 4 plan and is following her eating plan approximately 1000 % of the time. She states she is exercising 0 minutes 0 times per week. Maria Stone feels that she is doing well with weight loss. She feels less bloated and has increased energy. Maria Stone is still following her plan closely and notes hunger is controlled. She has not started exercising yet due to her severe bilateral knee osteoarthritis.  We were unable to weigh the patient today for this TeleHealth visit. She feels as if she has lost weight since her last visit. She has lost 14 lbs since starting treatment with [name].  Depression with emotional eating behaviors Chivon's mood is stable on Prozac. She is happy that she is doing well taking control of her health and is doing well avoiding emotional eating. She has been working on behavior modification techniques to help reduce her emotional eating and has been somewhat successful. Maria Stone denies worsening insomnia. She shows no sign of suicidal or homicidal ideations.  Depression screen [facility] 2/9 02/03/2019 01/13/2019 12/30/2018  Decreased Interest 0 0 2  Down, Depressed, Hopeless 0 0 1  PHQ - 2 Score 0 0 3  Altered sleeping 0 0 0  Tired, decreased energy 0 0 1  Change in appetite 0 0 1  Feeling bad or failure about yourself  0 0 2  Trouble concentrating 0 0 1  Moving slowly or fidgety/restless 0 0 0  Suicidal thoughts 0 0 0  PHQ-9 Score 0 0 8  Difficult doing work/chores - - Not difficult at all   ASSESSMENT AND  PLAN:  Other depression  Class 3 severe obesity with serious comorbidity and body mass index (BMI) greater than or equal to 70 in adult, unspecified obesity type (HCC)  PLAN:  Depression with Emotional Eating Behaviors We discussed behavior modification techniques today to help Maria Stone deal with her emotional eating and depression. She has agreed to continue taking Prozac and we discussed the importance of keeping herself occupied to avoid stress eating. Maria Stone agreed to follow up as directed in 2 weeks.  I spent > than 50% of the 15 minute visit on counseling as documented in the note.  Obesity Maria Stone is currently in the action stage of change. As such, her goal is to continue with weight loss efforts. She has agreed to follow the Category 4 plan. Emiliya has been instructed to start walking indoors or outdoors for 5 minutes per day. We discussed the following Behavioral Modification Strategies today: work on meal planning and easy cooking plans and emotional eating strategies.  Evin has agreed to follow up with our clinic in 2 weeks. She was informed of the importance of frequent follow up visits to maximize her success with intensive lifestyle modifications for her multiple health conditions.  ALLERGIES: Allergies  Allergen Reactions  . Nsaids Anaphylaxis  . Tolmetin Anaphylaxis  . Contrast Media [Iodinated Diagnostic Agents] Swelling  . Penicillins Other (See Comments)    Childhood allergy  . Codeine Rash    MEDICATIONS: Current Outpatient Medications on File  Prior to Visit  Medication Sig Dispense Refill  . acetaminophen (TYLENOL) 500 MG tablet Take 1,000 mg by mouth every 6 (six) hours as needed for moderate pain or headache.     . Cannabidiol 100 MG/ML SOLN Take by mouth.    . colchicine 0.6 MG tablet Take 0.6 mg by mouth daily.    Marland Kitchen FLUoxetine (PROZAC) 20 MG tablet Take 20 mg by mouth daily.    . folic acid (FOLVITE) 1 MG tablet Take 1 mg by mouth daily.     . furosemide (LASIX) 40 MG tablet Take 40 mg by mouth daily.    Marland Kitchen gabapentin (NEURONTIN) 300 MG capsule Take 300 mg by mouth 3 (three) times daily.    Marland Kitchen lisinopril (PRINIVIL,ZESTRIL) 10 MG tablet Take 10 mg by mouth daily.     Marland Kitchen loratadine (CLARITIN) 10 MG tablet Take 10 mg by mouth daily.    . Multiple Vitamin (MULTIVITAMIN WITH MINERALS) TABS Take 1 tablet by mouth daily.    . Multiple Vitamins-Minerals (AIRBORNE GUMMIES PO) Take 1 each by mouth daily.    Marland Kitchen PROAIR HFA 108 (90 Base) MCG/ACT inhaler Inhale 2 puffs into the lungs every 6 (six) hours as needed for shortness of breath or wheezing.  3  . Vitamin D, Ergocalciferol, (DRISDOL) 1.25 MG (50000 UT) CAPS capsule Take 1 capsule (50,000 Units total) by mouth every 7 (seven) days. 4 capsule 0   No current facility-administered medications on file prior to visit.     PAST MEDICAL HISTORY: Past Medical History:  Diagnosis Date  . Anxiety   . Arthritis   . Asthma   . Chronic pain   . Chronic pain   . Family history of adverse reaction to anesthesia    PONV for pt mother  . Heart murmur   . Hemolytic anemia (HCC)   . Hyperlipidemia   . Hypertension   . Joint pain   . Knee pain, bilateral   . Lower extremity edema   . Mass    right middle finger  . Morbid obesity (HCC)   . Osteoarthritis   . Osteoporosis   . Perirectal abscess   . Venous stasis     PAST SURGICAL HISTORY: Past Surgical History:  Procedure Laterality Date  . COLONOSCOPY    . INCISION AND DRAINAGE PERIRECTAL ABSCESS  05/25/2012   Procedure: IRRIGATION AND DEBRIDEMENT PERIRECTAL ABSCESS;  Surgeon: Valarie Merino, MD;  Location: [facility];  Service: General;  Laterality: N/A;  . MASS EXCISION Right 01/01/2018   Procedure: EXCISION MASS RIGHT MIDDLE FINGER;  Surgeon: Cindee Salt, MD;  Location: [facility];  Service: Orthopedics;  Laterality: Right;    SOCIAL HISTORY: Social History   Tobacco Use  . Smoking status: Former Smoker    Types: Cigarettes  . Smokeless  tobacco: Never Used  . Tobacco comment: 07/01/1976  Substance Use Topics  . Alcohol use: No    Frequency: Never  . Drug use: No    FAMILY HISTORY: Family History  Problem Relation Age of Onset  . Diabetes Father   . Hyperlipidemia Father   . Hypertension Father   . Heart disease Father   . Kidney disease Father   . Obesity Father   . Diabetes Other     ROS: Review of Systems  Psychiatric/Behavioral: Positive for depression. Negative for suicidal ideas. The patient has insomnia.        Negative for homicidal ideations.    PHYSICAL EXAM: Pt in no acute distress  RECENT LABS  AND TESTS: BMET    Component Value Date/Time   NA 140 12/30/2018 1155   K 4.6 12/30/2018 1155   CL 101 12/30/2018 1155   CO2 24 12/30/2018 1155   GLUCOSE 89 12/30/2018 1155   GLUCOSE 93 01/01/2018 1312   BUN 29 (H) 12/30/2018 1155   CREATININE 1.14 (H) 12/30/2018 1155   CALCIUM 9.7 12/30/2018 1155   GFRNONAA 51 (L) 12/30/2018 1155   GFRAA 59 (L) 12/30/2018 1155   Lab Results  Component Value Date   HGBA1C 5.4 12/30/2018   HGBA1C (L) 03/08/2008    3.6 (NOTE)   The ADA recommends the following therapeutic goals for glycemic   control related to Hgb A1C measurement:   Goal of Therapy:   < 7.0% Hgb A1C   Action Suggested:  > 8.0% Hgb A1C   Ref:  Diabetes Care, 22, Suppl. 1, 1999   Lab Results  Component Value Date   INSULIN 16.9 12/30/2018   CBC    Component Value Date/Time   WBC 6.0 12/30/2018 1155   WBC 6.2 01/01/2018 1215   RBC 4.44 12/30/2018 1155   RBC 4.38 01/01/2018 1215   HGB 13.9 12/30/2018 1155   HGB 15.2 12/12/2008 0804   HCT 41.4 12/30/2018 1155   HCT 43.8 12/12/2008 0804   PLT 161 01/01/2018 1215   PLT 175 12/12/2008 0804   MCV 93 12/30/2018 1155   MCV 90.8 12/12/2008 0804   MCH 31.3 12/30/2018 1155   MCH 31.5 01/01/2018 1215   MCHC 33.6 12/30/2018 1155   MCHC 32.0 01/01/2018 1215   RDW 11.7 12/30/2018 1155   RDW 13.6 12/12/2008 0804   LYMPHSABS 1.4 12/30/2018 1155    LYMPHSABS 1.5 12/12/2008 0804   MONOABS 1.3 (H) 05/20/2012 1348   MONOABS 0.5 12/12/2008 0804   EOSABS 0.1 12/30/2018 1155   BASOSABS 0.1 12/30/2018 1155   BASOSABS 0.0 12/12/2008 0804   Iron/TIBC/Ferritin/ %Sat    Component Value Date/Time   IRON 76 07/21/2008 0753   TIBC 295 07/21/2008 0753   FERRITIN 219 07/21/2008 0753   IRONPCTSAT 26 07/21/2008 0753   Lipid Panel     Component Value Date/Time   CHOL 243 (H) 12/30/2018 1155   TRIG 137 12/30/2018 1155   HDL 47 12/30/2018 1155   CHOLHDL 7.3 03/07/2008 0525   VLDL 38 03/07/2008 0525   LDLCALC 169 (H) 12/30/2018 1155   Hepatic Function Panel     Component Value Date/Time   PROT 6.6 12/30/2018 1155   ALBUMIN 4.3 12/30/2018 1155   AST 21 12/30/2018 1155   ALT 15 12/30/2018 1155   ALKPHOS 101 12/30/2018 1155   BILITOT 0.8 12/30/2018 1155   BILIDIR 0.6 (H) 03/09/2008 0500   IBILI 4.2 (H) 03/06/2008 1500    Results for Kerin SalenMCNULTY, Abria K (MRN [id number]) as of 03/15/2019 12:04  Ref. Range 12/30/2018 11:55  Vitamin D, 25-Hydroxy Latest Ref Range: 30.0 - 100.0 ng/mL 29.8 (L)     I, Kirke Corinara Soares, CMA, am acting as transcriptionist for Wilder Gladearen D. , MD I have reviewed the above documentation for accuracy and completeness, and I agree with the above. -Quillian Quincearen , MD

## 2019-03-18 ENCOUNTER — Encounter: Payer: Self-pay | Admitting: Family Medicine

## 2019-03-25 ENCOUNTER — Other Ambulatory Visit (INDEPENDENT_AMBULATORY_CARE_PROVIDER_SITE_OTHER): Payer: Self-pay | Admitting: Family Medicine

## 2019-03-25 DIAGNOSIS — E559 Vitamin D deficiency, unspecified: Secondary | ICD-10-CM

## 2019-03-29 ENCOUNTER — Other Ambulatory Visit: Payer: Self-pay

## 2019-03-29 ENCOUNTER — Ambulatory Visit (INDEPENDENT_AMBULATORY_CARE_PROVIDER_SITE_OTHER): Payer: BLUE CROSS/BLUE SHIELD | Admitting: Family Medicine

## 2019-03-29 ENCOUNTER — Encounter (INDEPENDENT_AMBULATORY_CARE_PROVIDER_SITE_OTHER): Payer: Self-pay | Admitting: Family Medicine

## 2019-03-29 DIAGNOSIS — E559 Vitamin D deficiency, unspecified: Secondary | ICD-10-CM | POA: Diagnosis not present

## 2019-03-29 DIAGNOSIS — Z6841 Body Mass Index (BMI) 40.0 and over, adult: Secondary | ICD-10-CM

## 2019-03-29 MED ORDER — VITAMIN D (ERGOCALCIFEROL) 1.25 MG (50000 UNIT) PO CAPS: 50000.0000 [IU] | ORAL_CAPSULE | ORAL | 0 refills | Status: DC

## 2019-03-30 NOTE — Progress Notes (Signed)
Office: [phone]  /  Fax: [phone] TeleHealth Visit:  Maria Stone has verbally consented to this TeleHealth visit today. The patient is located at home, the provider is located at the [facility] office. The participants in this visit include the listed provider and patient. The visit was conducted today via Face Time.  HPI:   Chief Complaint: OBESITY Maria Stone is here to discuss her progress with her obesity treatment plan. She is on the Category 4 plan and is following her eating plan approximately 100 % of the time. She states she is walking back and forth in her house and in the driveway. Maria Stone continues to do well with weight loss. Her weight at home is 363 pounds today. She is walking more indoors and some outdoors. She asks if we have a good soup recipe that she can have from time to time.  We were unable to weigh the patient today for this TeleHealth visit. She has lost 14 lbs since starting treatment with [name].  Vitamin D Deficiency Maria Stone has a diagnosis of vitamin D deficiency. She is currently stable on vit D, but is not yet at goal. Maria Stone denies nausea, vomiting, or muscle weakness.  ASSESSMENT AND PLAN:  Vitamin D deficiency - Plan: Vitamin D, Ergocalciferol, (DRISDOL) 1.25 MG (50000 UT) CAPS capsule  Class 3 severe obesity with serious comorbidity and body mass index (BMI) greater than or equal to 70 in adult, unspecified obesity type (HCC)  PLAN:  Vitamin D Deficiency Maria Stone was informed that low vitamin D levels contribute to fatigue and are associated with obesity, breast, and colon cancer. Maria Stone agrees to continue to take prescription Vit D ,000 IU every week #4 with no refills and to continue her high calcium diet. She will follow up for routine testing of vitamin D, at least 2-3 times per year. She was informed of the risk of over-replacement of vitamin D and agrees to not increase her dose unless she discusses this with [name]  first. Maria Stone agrees to follow up in 2 weeks as directed.  I spent > than 50% of the 25 minute visit on counseling as documented in the note.  Obesity Maria Stone is currently in the action stage of change. As such, her goal is to continue with weight loss efforts. She has agreed to follow the Category 4 plan. Maria Stone has been instructed to work up to a goal of 150 minutes of combined cardio and strengthening exercise per week for weight loss and overall health benefits. We discussed the following Behavioral Modification Strategies today: work on meal planning and easy cooking plans and no skipping meals.  Maria Stone has agreed to follow up with our clinic in 2 weeks. She was informed of the importance of frequent follow up visits to maximize her success with intensive lifestyle modifications for her multiple health conditions.  ALLERGIES: Allergies  Allergen Reactions  . Nsaids Anaphylaxis  . Tolmetin Anaphylaxis  . Contrast Media [Iodinated Diagnostic Agents] Swelling  . Penicillins Other (See Comments)    Childhood allergy  . Codeine Rash    MEDICATIONS: Current Outpatient Medications on File Prior to Visit  Medication Sig Dispense Refill  . acetaminophen (TYLENOL) 500 MG tablet Take 1,000 mg by mouth every 6 (six) hours as needed for moderate pain or headache.     . alendronate (FOSAMAX) 70 MG tablet Take 70 mg by mouth once a week. Take with a full glass of water on an empty stomach.    Marland Kitchen allopurinol (ZYLOPRIM)  100 MG tablet Take 100 mg by mouth daily.    . Cannabidiol 100 MG/ML SOLN Take by mouth.    . colchicine 0.6 MG tablet Take 0.6 mg by mouth daily.    Marland Kitchen FLUoxetine (PROZAC) 20 MG tablet Take 20 mg by mouth daily.    . folic acid (FOLVITE) 1 MG tablet Take 1 mg by mouth daily.    . furosemide (LASIX) 40 MG tablet Take 40 mg by mouth daily.    Marland Kitchen gabapentin (NEURONTIN) 300 MG capsule Take 300 mg by mouth 3 (three) times daily.    Marland Kitchen lisinopril (PRINIVIL,ZESTRIL) 10 MG tablet  Take 10 mg by mouth daily.     Marland Kitchen loratadine (CLARITIN) 10 MG tablet Take 10 mg by mouth daily.    . Multiple Vitamin (MULTIVITAMIN WITH MINERALS) TABS Take 1 tablet by mouth daily.    . Multiple Vitamins-Minerals (AIRBORNE GUMMIES PO) Take 1 each by mouth daily.    Marland Kitchen PROAIR HFA 108 (90 Base) MCG/ACT inhaler Inhale 2 puffs into the lungs every 6 (six) hours as needed for shortness of breath or wheezing.  3   No current facility-administered medications on file prior to visit.     PAST MEDICAL HISTORY: Past Medical History:  Diagnosis Date  . Anxiety   . Arthritis   . Asthma   . Chronic pain   . Chronic pain   . Family history of adverse reaction to anesthesia    PONV for pt mother  . Heart murmur   . Hemolytic anemia (HCC)   . Hyperlipidemia   . Hypertension   . Joint pain   . Knee pain, bilateral   . Lower extremity edema   . Mass    right middle finger  . Morbid obesity (HCC)   . Osteoarthritis   . Osteoporosis   . Perirectal abscess   . Venous stasis     PAST SURGICAL HISTORY: Past Surgical History:  Procedure Laterality Date  . COLONOSCOPY    . INCISION AND DRAINAGE PERIRECTAL ABSCESS  05/25/2012   Procedure: IRRIGATION AND DEBRIDEMENT PERIRECTAL ABSCESS;  Surgeon: Valarie Merino, MD;  Location: [facility];  Service: General;  Laterality: N/A;  . MASS EXCISION Right 01/01/2018   Procedure: EXCISION MASS RIGHT MIDDLE FINGER;  Surgeon: Cindee Salt, MD;  Location: [facility];  Service: Orthopedics;  Laterality: Right;    SOCIAL HISTORY: Social History   Tobacco Use  . Smoking status: Former Smoker    Types: Cigarettes  . Smokeless tobacco: Never Used  . Tobacco comment: 07/01/1976  Substance Use Topics  . Alcohol use: No    Frequency: Never  . Drug use: No    FAMILY HISTORY: Family History  Problem Relation Age of Onset  . Diabetes Father   . Hyperlipidemia Father   . Hypertension Father   . Heart disease Father   . Kidney disease Father   . Obesity Father    . Diabetes Other     ROS: Review of Systems  Gastrointestinal: Negative for nausea and vomiting.  Musculoskeletal:       Negative for muscle weakness.    PHYSICAL EXAM: Pt in no acute distress  RECENT LABS AND TESTS: BMET    Component Value Date/Time   NA 140 12/30/2018 1155   K 4.6 12/30/2018 1155   CL 101 12/30/2018 1155   CO2 24 12/30/2018 1155   GLUCOSE 89 12/30/2018 1155   GLUCOSE 93 01/01/2018 1312   BUN 29 (H) 12/30/2018 1155   CREATININE  1.14 (H) 12/30/2018 1155   CALCIUM 9.7 12/30/2018 1155   GFRNONAA 51 (L) 12/30/2018 1155   GFRAA 59 (L) 12/30/2018 1155   Lab Results  Component Value Date   HGBA1C 5.4 12/30/2018   HGBA1C (L) 03/08/2008    3.6 (NOTE)   The ADA recommends the following therapeutic goals for glycemic   control related to Hgb A1C measurement:   Goal of Therapy:   < 7.0% Hgb A1C   Action Suggested:  > 8.0% Hgb A1C   Ref:  Diabetes Care, 22, Suppl. 1, 1999   Lab Results  Component Value Date   INSULIN 16.9 12/30/2018   CBC    Component Value Date/Time   WBC 6.0 12/30/2018 1155   WBC 6.2 01/01/2018 1215   RBC 4.44 12/30/2018 1155   RBC 4.38 01/01/2018 1215   HGB 13.9 12/30/2018 1155   HGB 15.2 12/12/2008 0804   HCT 41.4 12/30/2018 1155   HCT 43.8 12/12/2008 0804   PLT 161 01/01/2018 1215   PLT 175 12/12/2008 0804   MCV 93 12/30/2018 1155   MCV 90.8 12/12/2008 0804   MCH 31.3 12/30/2018 1155   MCH 31.5 01/01/2018 1215   MCHC 33.6 12/30/2018 1155   MCHC 32.0 01/01/2018 1215   RDW 11.7 12/30/2018 1155   RDW 13.6 12/12/2008 0804   LYMPHSABS 1.4 12/30/2018 1155   LYMPHSABS 1.5 12/12/2008 0804   MONOABS 1.3 (H) 05/20/2012 1348   MONOABS 0.5 12/12/2008 0804   EOSABS 0.1 12/30/2018 1155   BASOSABS 0.1 12/30/2018 1155   BASOSABS 0.0 12/12/2008 0804   Iron/TIBC/Ferritin/ %Sat    Component Value Date/Time   IRON 76 07/21/2008 0753   TIBC 295 07/21/2008 0753   FERRITIN 219 07/21/2008 0753   IRONPCTSAT 26 07/21/2008 0753   Lipid  Panel     Component Value Date/Time   CHOL 243 (H) 12/30/2018 1155   TRIG 137 12/30/2018 1155   HDL 47 12/30/2018 1155   CHOLHDL 7.3 03/07/2008 0525   VLDL 38 03/07/2008 0525   LDLCALC 169 (H) 12/30/2018 1155   Hepatic Function Panel     Component Value Date/Time   PROT 6.6 12/30/2018 1155   ALBUMIN 4.3 12/30/2018 1155   AST 21 12/30/2018 1155   ALT 15 12/30/2018 1155   ALKPHOS 101 12/30/2018 1155   BILITOT 0.8 12/30/2018 1155   BILIDIR 0.6 (H) 03/09/2008 0500   IBILI 4.2 (H) 03/06/2008 1500    Results for Kerin SalenMCNULTY, Antwan K (MRN [id number]) as of 03/30/2019 13:30  Ref. Range 12/30/2018 11:55  Vitamin D, 25-Hydroxy Latest Ref Range: 30.0 - 100.0 ng/mL 29.8 (L)     I, Kirke Corinara Soares, CMA, am acting as transcriptionist for Wilder Gladearen D. Deola Rewis, MD I have reviewed the above documentation for accuracy and completeness, and I agree with the above. -Quillian Quincearen Laurin Morgenstern, MD

## 2019-04-13 ENCOUNTER — Ambulatory Visit (INDEPENDENT_AMBULATORY_CARE_PROVIDER_SITE_OTHER): Payer: BC Managed Care – PPO | Admitting: Family Medicine

## 2019-04-13 ENCOUNTER — Encounter (INDEPENDENT_AMBULATORY_CARE_PROVIDER_SITE_OTHER): Payer: Self-pay | Admitting: Family Medicine

## 2019-04-13 ENCOUNTER — Other Ambulatory Visit: Payer: Self-pay

## 2019-04-13 DIAGNOSIS — E559 Vitamin D deficiency, unspecified: Secondary | ICD-10-CM

## 2019-04-13 DIAGNOSIS — Z6841 Body Mass Index (BMI) 40.0 and over, adult: Secondary | ICD-10-CM

## 2019-04-13 MED ORDER — VITAMIN D (ERGOCALCIFEROL) 1.25 MG (50000 UNIT) PO CAPS: 50000.0000 [IU] | ORAL_CAPSULE | ORAL | 0 refills | Status: DC

## 2019-04-13 NOTE — Progress Notes (Signed)
Office: [phone]  /  Fax: [phone] TeleHealth Visit:  Maria Stone has verbally consented to this TeleHealth visit today. The patient is located at home, the provider is located at the [facility] office. The participants in this visit include the listed provider and patient. The visit was conducted today via Face Time.  HPI:   Chief Complaint: OBESITY Maria Stone is here to discuss her progress with her obesity treatment plan. She is on the Category 4 plan and is following her eating plan approximately 96 % of the time. She states she is taking more steps and has more mobility. Maria Stone reports that she is continuing to do well on her Category 4 plan and that she weighed 361 at home today. She is taking care of her 2 sisters who have had surgery recently and her elderly mother. She is pleased that she now has energy and the ability to do this.  We were unable to weigh the patient today for this TeleHealth visit. She feels as if she has lost weight since her last visit. She has lost 14 lbs since starting treatment with [name].  Vitamin D Deficiency Maria Stone has a diagnosis of vitamin D deficiency. She is currently stable on vit D, but is not yet at goal.   ASSESSMENT AND PLAN:  Vitamin D deficiency - Plan: Vitamin D, Ergocalciferol, (DRISDOL) 1.25 MG (50000 UT) CAPS capsule  Class 3 severe obesity with serious comorbidity and body mass index (BMI) greater than or equal to 70 in adult, unspecified obesity type (HCC)  PLAN:  Vitamin D Deficiency Maria Stone was informed that low vitamin D levels contribute to fatigue and are associated with obesity, breast, and colon cancer. Maria Stone agrees to continue to take prescription Vit D ,000 IU every week #4 with no refills and will follow up for routine testing of vitamin D, at least 2-3 times per year. She was informed of the risk of over-replacement of vitamin D and agrees to not increase her dose unless she discusses this  with [name] first. Maria Stone agrees to follow up in 3 weeks as directed.  Obesity Maria Stone is currently in the action stage of change. As such, her goal is to continue with weight loss efforts. She has agreed to follow the Category 4 plan. Maria Stone has been instructed to work up to a goal of 150 minutes of combined cardio and strengthening exercise per week for weight loss and overall health benefits. We discussed the following Behavioral Modification Strategies today: work on meal planning and easy cooking plans, keeping healthy foods in the home, and emotional eating strategies.  Maria Stone has agreed to follow up with our clinic in 3 weeks. She was informed of the importance of frequent follow up visits to maximize her success with intensive lifestyle modifications for her multiple health conditions.  ALLERGIES: Allergies  Allergen Reactions  . Nsaids Anaphylaxis  . Tolmetin Anaphylaxis  . Contrast Media [Iodinated Diagnostic Agents] Swelling  . Penicillins Other (See Comments)    Childhood allergy  . Codeine Rash    MEDICATIONS: Current Outpatient Medications on File Prior to Visit  Medication Sig Dispense Refill  . acetaminophen (TYLENOL) 500 MG tablet Take 1,000 mg by mouth every 6 (six) hours as needed for moderate pain or headache.     . alendronate (FOSAMAX) 70 MG tablet Take 70 mg by mouth once a week. Take with a full glass of water on an empty stomach.    Marland Kitchen allopurinol (ZYLOPRIM) 100 MG tablet Take 100  mg by mouth daily.    . Cannabidiol 100 MG/ML SOLN Take by mouth.    . colchicine 0.6 MG tablet Take 0.6 mg by mouth daily.    Marland Kitchen FLUoxetine (PROZAC) 20 MG tablet Take 20 mg by mouth daily.    . folic acid (FOLVITE) 1 MG tablet Take 1 mg by mouth daily.    . furosemide (LASIX) 40 MG tablet Take 40 mg by mouth daily.    Marland Kitchen gabapentin (NEURONTIN) 300 MG capsule Take 300 mg by mouth 3 (three) times daily.    Marland Kitchen lisinopril (PRINIVIL,ZESTRIL) 10 MG tablet Take 10 mg by mouth daily.      Marland Kitchen loratadine (CLARITIN) 10 MG tablet Take 10 mg by mouth daily.    . Multiple Vitamin (MULTIVITAMIN WITH MINERALS) TABS Take 1 tablet by mouth daily.    . Multiple Vitamins-Minerals (AIRBORNE GUMMIES PO) Take 1 each by mouth daily.    Marland Kitchen PROAIR HFA 108 (90 Base) MCG/ACT inhaler Inhale 2 puffs into the lungs every 6 (six) hours as needed for shortness of breath or wheezing.  3   No current facility-administered medications on file prior to visit.     PAST MEDICAL HISTORY: Past Medical History:  Diagnosis Date  . Anxiety   . Arthritis   . Asthma   . Chronic pain   . Chronic pain   . Family history of adverse reaction to anesthesia    PONV for pt mother  . Heart murmur   . Hemolytic anemia (HCC)   . Hyperlipidemia   . Hypertension   . Joint pain   . Knee pain, bilateral   . Lower extremity edema   . Mass    right middle finger  . Morbid obesity (HCC)   . Osteoarthritis   . Osteoporosis   . Perirectal abscess   . Venous stasis     PAST SURGICAL HISTORY: Past Surgical History:  Procedure Laterality Date  . COLONOSCOPY    . INCISION AND DRAINAGE PERIRECTAL ABSCESS  05/25/2012   Procedure: IRRIGATION AND DEBRIDEMENT PERIRECTAL ABSCESS;  Surgeon: Valarie Merino, MD;  Location: [facility];  Service: General;  Laterality: N/A;  . MASS EXCISION Right 01/01/2018   Procedure: EXCISION MASS RIGHT MIDDLE FINGER;  Surgeon: Cindee Salt, MD;  Location: [facility];  Service: Orthopedics;  Laterality: Right;    SOCIAL HISTORY: Social History   Tobacco Use  . Smoking status: Former Smoker    Types: Cigarettes  . Smokeless tobacco: Never Used  . Tobacco comment: 07/01/1976  Substance Use Topics  . Alcohol use: No    Frequency: Never  . Drug use: No    FAMILY HISTORY: Family History  Problem Relation Age of Onset  . Diabetes Father   . Hyperlipidemia Father   . Hypertension Father   . Heart disease Father   . Kidney disease Father   . Obesity Father   . Diabetes Other     ROS:  Review of Systems  Gastrointestinal: Negative for nausea and vomiting.  Musculoskeletal:       Negative for muscle weakness.    PHYSICAL EXAM: Pt in no acute distress  RECENT LABS AND TESTS: BMET    Component Value Date/Time   NA 140 12/30/2018 1155   K 4.6 12/30/2018 1155   CL 101 12/30/2018 1155   CO2 24 12/30/2018 1155   GLUCOSE 89 12/30/2018 1155   GLUCOSE 93 01/01/2018 1312   BUN 29 (H) 12/30/2018 1155   CREATININE 1.14 (H) 12/30/2018 1155  CALCIUM 9.7 12/30/2018 1155   GFRNONAA 51 (L) 12/30/2018 1155   GFRAA 59 (L) 12/30/2018 1155   Lab Results  Component Value Date   HGBA1C 5.4 12/30/2018   HGBA1C (L) 03/08/2008    3.6 (NOTE)   The ADA recommends the following therapeutic goals for glycemic   control related to Hgb A1C measurement:   Goal of Therapy:   < 7.0% Hgb A1C   Action Suggested:  > 8.0% Hgb A1C   Ref:  Diabetes Care, 22, Suppl. 1, 1999   Lab Results  Component Value Date   INSULIN 16.9 12/30/2018   CBC    Component Value Date/Time   WBC 6.0 12/30/2018 1155   WBC 6.2 01/01/2018 1215   RBC 4.44 12/30/2018 1155   RBC 4.38 01/01/2018 1215   HGB 13.9 12/30/2018 1155   HGB 15.2 12/12/2008 0804   HCT 41.4 12/30/2018 1155   HCT 43.8 12/12/2008 0804   PLT 161 01/01/2018 1215   PLT 175 12/12/2008 0804   MCV 93 12/30/2018 1155   MCV 90.8 12/12/2008 0804   MCH 31.3 12/30/2018 1155   MCH 31.5 01/01/2018 1215   MCHC 33.6 12/30/2018 1155   MCHC 32.0 01/01/2018 1215   RDW 11.7 12/30/2018 1155   RDW 13.6 12/12/2008 0804   LYMPHSABS 1.4 12/30/2018 1155   LYMPHSABS 1.5 12/12/2008 0804   MONOABS 1.3 (H) 05/20/2012 1348   MONOABS 0.5 12/12/2008 0804   EOSABS 0.1 12/30/2018 1155   BASOSABS 0.1 12/30/2018 1155   BASOSABS 0.0 12/12/2008 0804   Iron/TIBC/Ferritin/ %Sat    Component Value Date/Time   IRON 76 07/21/2008 0753   TIBC 295 07/21/2008 0753   FERRITIN 219 07/21/2008 0753   IRONPCTSAT 26 07/21/2008 0753   Lipid Panel     Component Value  Date/Time   CHOL 243 (H) 12/30/2018 1155   TRIG 137 12/30/2018 1155   HDL 47 12/30/2018 1155   CHOLHDL 7.3 03/07/2008 0525   VLDL 38 03/07/2008 0525   LDLCALC 169 (H) 12/30/2018 1155   Hepatic Function Panel     Component Value Date/Time   PROT 6.6 12/30/2018 1155   ALBUMIN 4.3 12/30/2018 1155   AST 21 12/30/2018 1155   ALT 15 12/30/2018 1155   ALKPHOS 101 12/30/2018 1155   BILITOT 0.8 12/30/2018 1155   BILIDIR 0.6 (H) 03/09/2008 0500   IBILI 4.2 (H) 03/06/2008 1500     Results for Maria Stone, Jericca K (MRN [id number]) as of 04/13/2019 16:38  Ref. Range 12/30/2018 11:55  Vitamin D, 25-Hydroxy Latest Ref Range: 30.0 - 100.0 ng/mL 29.8 (L)    I, Kirke Corinara Soares, CMA, am acting as transcriptionist for Wilder Gladearen D. Deago Burruss, MD I have reviewed the above documentation for accuracy and completeness, and I agree with the above. -Quillian Quincearen Danyell Shader, MD

## 2019-04-20 ENCOUNTER — Other Ambulatory Visit (INDEPENDENT_AMBULATORY_CARE_PROVIDER_SITE_OTHER): Payer: Self-pay | Admitting: Family Medicine

## 2019-04-20 DIAGNOSIS — E559 Vitamin D deficiency, unspecified: Secondary | ICD-10-CM

## 2019-05-04 ENCOUNTER — Telehealth (INDEPENDENT_AMBULATORY_CARE_PROVIDER_SITE_OTHER): Payer: BC Managed Care – PPO | Admitting: Family Medicine

## 2019-05-04 ENCOUNTER — Other Ambulatory Visit: Payer: Self-pay

## 2019-05-04 ENCOUNTER — Encounter (INDEPENDENT_AMBULATORY_CARE_PROVIDER_SITE_OTHER): Payer: Self-pay | Admitting: Family Medicine

## 2019-05-04 DIAGNOSIS — Z6841 Body Mass Index (BMI) 40.0 and over, adult: Secondary | ICD-10-CM

## 2019-05-04 DIAGNOSIS — E559 Vitamin D deficiency, unspecified: Secondary | ICD-10-CM | POA: Diagnosis not present

## 2019-05-04 DIAGNOSIS — I10 Essential (primary) hypertension: Secondary | ICD-10-CM

## 2019-05-04 MED ORDER — VITAMIN D (ERGOCALCIFEROL) 1.25 MG (50000 UNIT) PO CAPS: 50000.0000 [IU] | ORAL_CAPSULE | ORAL | 0 refills | Status: DC

## 2019-05-04 NOTE — Progress Notes (Signed)
Office: [phone]  /  Fax: [phone] TeleHealth Visit:  Maria SalenMarybeth K Delano has verbally consented to this TeleHealth visit today. The patient is located at home, the provider is located at the [facility] office. The participants in this visit include the listed provider and patient. Maria Stone was unable to use realtime audiovisual technology today and the telehealth visit was conducted via telephone.   HPI:   Chief Complaint: OBESITY Maria Stone is here to discuss her progress with her obesity treatment plan. She is on the  follow the Category 4 plan and is following her eating plan approximately 90 % of the time. She states she is exercising by walking 5-7 times per week. Maria Stone feels she has done well with maintaining her weight since her last visit. 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  We were unable to weigh the patient today for this TeleHealth visit. She feels as if she has maintained weight since her last visit. She has lost 14 lbs since starting treatment with us.  Vitamin D deficiency Maria Stone has a diagnosis of vitamin D deficiency. She is currently taking vit D and denies nausea, vomiting or muscle weakness. Vitamin D level not yet at goal.   Hypertension Maria Stone is a 63 y.o. female with hypertension.  Maria SalenMarybeth K Even denies chest pain, headaches, or shortness of breath on exertion. She is working weight loss to help control her blood pressure with the goal of decreasing her risk of heart attack and stroke. Breella did have 1 episode of feeling a bit lightheaded while outside in the heat. She is not checking her blood pressure at home, but is on lasix.     ASSESSMENT AND PLAN:  Vitamin D deficiency - Plan: Vitamin D, Ergocalciferol, (DRISDOL) 1.25 MG (50000 UT) CAPS capsule  Essential hypertension  Class 3 severe obesity with serious comorbidity and  body mass index (BMI) greater than or equal to 70 in adult, unspecified obesity type (HCC)  PLAN: Vitamin D Deficiency Sherra was informed that low vitamin D levels contributes to fatigue and are associated with obesity, breast, and colon cancer. She agrees to continue to take prescription Vit D @50 ,000 IU every week #4 with no refills and will follow up for routine testing of vitamin D, at least 2-3 times per year. She was informed of the risk of over-replacement of vitamin D and agrees to not increase her dose unless she discusses this with us first. Agrees to follow up with our clinic as directed.   Hypertension We discussed sodium restriction, working on healthy weight loss, and a regular exercise program as the means to achieve improved blood pressure control. Chastity agreed with this plan and agreed to follow up as directed. We will continue to monitor her blood pressure as well as her progress with the above lifestyle modifications. She will continue her medications as prescribed and will watch for signs of hypotension as she continues her lifestyle modifications. She will increase her water and will continue to monitor. If lightheaded continues we will have to check blood pressure in office and possibly change medications.   I spent > than 50% of the 25 minute visit on counseling as documented in the note.   Obesity Maria Stone is currently in the action stage of change. As such, her goal is to continue with weight loss efforts She has agreed to follow the Category  4 plan Shyloh has been instructed to work up to a goal of 150 minutes of combined cardio and strengthening exercise per week for weight lJeanella Flatteryoss and overall health benefits. We discussed the following Behavioral Modification Stratagies today: work on meal planning and easy cooking plans, keeping healthy foods in the home, and increasing water intake.    Maria Stone has agreed to follow up with our clinic in 3 weeks. She was informed  of the importance of frequent follow up visits to maximize her success with intensive lifestyle modifications for her multiple health conditions.  ALLERGIES: Allergies  Allergen Reactions  . Nsaids Anaphylaxis  . Tolmetin Anaphylaxis  . Contrast Media [Iodinated Diagnostic Agents] Swelling  . Penicillins Other (See Comments)    Childhood allergy  . Codeine Rash    MEDICATIONS: Current Outpatient Medications on File Prior to Visit  Medication Sig Dispense Refill  . acetaminophen (TYLENOL) 500 MG tablet Take 1,000 mg by mouth every 6 (six) hours as needed for moderate pain or headache.     . alendronate (FOSAMAX) 70 MG tablet Take 70 mg by mouth once a week. Take with a full glass of water on an empty stomach.    Marland Kitchen. allopurinol (ZYLOPRIM) 100 MG tablet Take 100 mg by mouth daily.    . Cannabidiol 100 MG/ML SOLN Take by mouth.    . colchicine 0.6 MG tablet Take 0.6 mg by mouth daily.    Marland Kitchen. FLUoxetine (PROZAC) 20 MG tablet Take 20 mg by mouth daily.    . folic acid (FOLVITE) 1 MG tablet Take 1 mg by mouth daily.    . furosemide (LASIX) 40 MG tablet Take 40 mg by mouth daily.    Marland Kitchen. gabapentin (NEURONTIN) 300 MG capsule Take 300 mg by mouth 3 (three) times daily.    Marland Kitchen. lisinopril (PRINIVIL,ZESTRIL) 10 MG tablet Take 10 mg by mouth daily.     Marland Kitchen. loratadine (CLARITIN) 10 MG tablet Take 10 mg by mouth daily.    . Multiple Vitamin (MULTIVITAMIN WITH MINERALS) TABS Take 1 tablet by mouth daily.    . Multiple Vitamins-Minerals (AIRBORNE GUMMIES PO) Take 1 each by mouth daily.    Marland Kitchen. PROAIR HFA 108 (90 Base) MCG/ACT inhaler Inhale 2 puffs into the lungs every 6 (six) hours as needed for shortness of breath or wheezing.  3   No current facility-administered medications on file prior to visit.     PAST MEDICAL HISTORY: Past Medical History:  Diagnosis Date  . Anxiety   . Arthritis   . Asthma   . Chronic pain   . Chronic pain   . Family history of adverse reaction to anesthesia    PONV for pt  mother  . Heart murmur   . Hemolytic anemia (HCC)   . Hyperlipidemia   . Hypertension   . Joint pain   . Knee pain, bilateral   . Lower extremity edema   . Mass    right middle finger  . Morbid obesity (HCC)   . Osteoarthritis   . Osteoporosis   . Perirectal abscess   . Venous stasis     PAST SURGICAL HISTORY: Past Surgical History:  Procedure Laterality Date  . COLONOSCOPY    . INCISION AND DRAINAGE PERIRECTAL ABSCESS  05/25/2012   Procedure: IRRIGATION AND DEBRIDEMENT PERIRECTAL ABSCESS;  Surgeon: Valarie MerinoMatthew B Martin, MD;  Location: [facility];  Service: General;  Laterality: N/A;  . MASS EXCISION Right 01/01/2018   Procedure: EXCISION MASS RIGHT MIDDLE FINGER;  Surgeon: Cindee SaltKuzma, Gary,  MD;  Location: [facility];  Service: Orthopedics;  Laterality: Right;    SOCIAL HISTORY: Social History   Tobacco Use  . Smoking status: Former Smoker    Types: Cigarettes  . Smokeless tobacco: Never Used  . Tobacco comment: 07/01/1976  Substance Use Topics  . Alcohol use: No    Frequency: Never  . Drug use: No    FAMILY HISTORY: Family History  Problem Relation Age of Onset  . Diabetes Father   . Hyperlipidemia Father   . Hypertension Father   . Heart disease Father   . Kidney disease Father   . Obesity Father   . Diabetes Other     ROS: Review of Systems  Respiratory: Negative for shortness of breath.   Cardiovascular: Negative for chest pain.  Gastrointestinal: Negative for nausea and vomiting.  Musculoskeletal:       Negative for muscle weakness  Neurological: Positive for dizziness. Negative for headaches.    PHYSICAL EXAM: Pt in no acute distress  RECENT LABS AND TESTS: BMET    Component Value Date/Time   NA 140 12/30/2018 1155   K 4.6 12/30/2018 1155   CL 101 12/30/2018 1155   CO2 24 12/30/2018 1155   GLUCOSE 89 12/30/2018 1155   GLUCOSE 93 01/01/2018 1312   BUN 29 (H) 12/30/2018 1155   CREATININE 1.14 (H) 12/30/2018 1155   CALCIUM 9.7 12/30/2018 1155   GFRNONAA 51  (L) 12/30/2018 1155   GFRAA 59 (L) 12/30/2018 1155   Lab Results  Component Value Date   HGBA1C 5.4 12/30/2018   HGBA1C (L) 03/08/2008    3.6 (NOTE)   The ADA recommends the following therapeutic goals for glycemic   control related to Hgb A1C measurement:   Goal of Therapy:   < 7.0% Hgb A1C   Action Suggested:  > 8.0% Hgb A1C   Ref:  Diabetes Care, 22, Suppl. 1, 1999   Lab Results  Component Value Date   INSULIN 16.9 12/30/2018   CBC    Component Value Date/Time   WBC 6.0 12/30/2018 1155   WBC 6.2 01/01/2018 1215   RBC 4.44 12/30/2018 1155   RBC 4.38 01/01/2018 1215   HGB 13.9 12/30/2018 1155   HGB 15.2 12/12/2008 0804   HCT 41.4 12/30/2018 1155   HCT 43.8 12/12/2008 0804   PLT 161 01/01/2018 1215   PLT 175 12/12/2008 0804   MCV 93 12/30/2018 1155   MCV 90.8 12/12/2008 0804   MCH 31.3 12/30/2018 1155   MCH 31.5 01/01/2018 1215   MCHC 33.6 12/30/2018 1155   MCHC 32.0 01/01/2018 1215   RDW 11.7 12/30/2018 1155   RDW 13.6 12/12/2008 0804   LYMPHSABS 1.4 12/30/2018 1155   LYMPHSABS 1.5 12/12/2008 0804   MONOABS 1.3 (H) 05/20/2012 1348   MONOABS 0.5 12/12/2008 0804   EOSABS 0.1 12/30/2018 1155   BASOSABS 0.1 12/30/2018 1155   BASOSABS 0.0 12/12/2008 0804   Iron/TIBC/Ferritin/ %Sat    Component Value Date/Time   IRON 76 07/21/2008 0753   TIBC 295 07/21/2008 0753   FERRITIN 219 07/21/2008 0753   IRONPCTSAT 26 07/21/2008 0753   Lipid Panel     Component Value Date/Time   CHOL 243 (H) 12/30/2018 1155   TRIG 137 12/30/2018 1155   HDL 47 12/30/2018 1155   CHOLHDL 7.3 03/07/2008 0525   VLDL 38 03/07/2008 0525   LDLCALC 169 (H) 12/30/2018 1155   Hepatic Function Panel     Component Value Date/Time   PROT 6.6 12/30/2018 1155  ALBUMIN 4.3 12/30/2018 1155   AST 21 12/30/2018 1155   ALT 15 12/30/2018 1155   ALKPHOS 101 12/30/2018 1155   BILITOT 0.8 12/30/2018 1155   BILIDIR 0.6 (H) 03/09/2008 0500   IBILI 4.2 (H) 03/06/2008 1500   Results for Maria SalenMCNULTY,  Tyleah K (MRN [id number]) as of 05/04/2019 20:10  Ref. Range 12/30/2018 11:55  Vitamin D, 25-Hydroxy Latest Ref Range: 30.0 - 100.0 ng/mL 29.8 (L)      I, Jeralene PetersAshleigh Haynes, am acting as Energy managertranscriptionist for Quillian Quincearen Arabela Basaldua, MD  I have reviewed the above documentation for accuracy and completeness, and I agree with the above. -Quillian Quincearen D'Arcy Abraha, MD

## 2019-05-16 ENCOUNTER — Other Ambulatory Visit (INDEPENDENT_AMBULATORY_CARE_PROVIDER_SITE_OTHER): Payer: Self-pay | Admitting: Family Medicine

## 2019-05-16 DIAGNOSIS — E559 Vitamin D deficiency, unspecified: Secondary | ICD-10-CM

## 2019-05-25 ENCOUNTER — Telehealth (INDEPENDENT_AMBULATORY_CARE_PROVIDER_SITE_OTHER): Payer: BC Managed Care – PPO | Admitting: Family Medicine

## 2019-05-25 ENCOUNTER — Encounter (INDEPENDENT_AMBULATORY_CARE_PROVIDER_SITE_OTHER): Payer: Self-pay | Admitting: Family Medicine

## 2019-05-25 ENCOUNTER — Other Ambulatory Visit: Payer: Self-pay

## 2019-05-25 DIAGNOSIS — I10 Essential (primary) hypertension: Secondary | ICD-10-CM

## 2019-05-25 DIAGNOSIS — R600 Localized edema: Secondary | ICD-10-CM

## 2019-05-25 DIAGNOSIS — Z6841 Body Mass Index (BMI) 40.0 and over, adult: Secondary | ICD-10-CM

## 2019-05-25 MED ORDER — FUROSEMIDE 40 MG PO TABS: 20.0000 mg | ORAL_TABLET | Freq: Every day | ORAL | 0 refills | Status: DC

## 2019-05-26 NOTE — Progress Notes (Signed)
Office: [phone]  /  Fax: [phone] TeleHealth Visit:  Maria Stone has verbally consented to this TeleHealth visit today. The patient is located at home, the provider is located at the [facility] office. The participants in this visit include the listed provider and patient. The visit was conducted today via telephone call.  HPI:   Chief Complaint: OBESITY Maria Stone is here to discuss her progress with her obesity treatment plan. She is on the Category 4 plan and is following her eating plan approximately 75% of the time. She states she is exercising 0 minutes 0 times per week. Maria Stone states she weighed 355 lbs today and has lost 1 lb since our last visit. She had a lot of celebration eating but tried to portion control. She has done well overall with weight loss and finds walking around easier. We were unable to weigh the patient today for this TeleHealth visit. She feels as if she has lost 1 lb since her last visit. She has lost 14 lbs since starting treatment with us.  Hypertension Maria Stone is a 63 y.o. female with hypertension and is on lisinopril 10 mg and Lasix 40 mg. Her blood pressure is doing very well according to her home readings of 100-108/48-77.  Maria Stone denies chest pain or shortness of breath on exertion. She is working weight loss to help control her blood pressure with the goal of decreasing her risk of heart attack and stroke.   Lower Extremity Edema Maria Stone is on Lasix for bilateral lower extremity edema. She is trying to increase her water intake but her blood pressure is low and she has CRI. Overall she has done very well with weight loss.   ASSESSMENT AND PLAN:  Essential hypertension  Localized edema - Plan: furosemide (LASIX) 40 MG tablet  Class 3 severe obesity with serious comorbidity and body mass index (BMI) greater than or equal to 70 in adult, unspecified obesity type (HCC)  PLAN:  Hypertension We  discussed sodium restriction, working on healthy weight loss, and a regular exercise program as the means to achieve improved blood pressure control. [name] agreed with this plan and agreed to follow up as directed. We will continue to monitor her blood pressure as well as her progress with the above lifestyle modifications. She will reduce her Lasix to 20 mg #30 with 0 refills and will watch for signs of hypotension as she continues her lifestyle modifications.  Lower Extremity Edema [name] will decrease her Lasix to 20 mg #30 with 0 refills and will follow-up with our clinic in 2 weeks to monitor her progress.  I spent > than 50% of the 25 minute visit on counseling as documented in the note.  Obesity Maria Stone is currently in the action stage of change. As such, her goal is to continue with weight loss efforts. She has agreed to follow the Category 4 plan. Maria Stone has been instructed to work up to a goal of 150 minutes of combined cardio and strengthening exercise per week for weight loss and overall health benefits. We discussed the following Behavioral Modification Strategies today: decreasing simple carbohydrates, decrease H20 intake, decreasing sodium intake, work on meal planning and easy cooking plans.  Maria Stone has agreed to follow-up with our clinic in 2 weeks. She was informed of the importance of frequent follow-up visits to maximize her success with intensive lifestyle modifications for her multiple health conditions.  ALLERGIES: Allergies  Allergen Reactions  . Nsaids Anaphylaxis  . Tolmetin  Anaphylaxis  . Contrast Media [Iodinated Diagnostic Agents] Swelling  . Penicillins Other (See Comments)    Childhood allergy  . Codeine Rash    MEDICATIONS: Current Outpatient Medications on File Prior to Visit  Medication Sig Dispense Refill  . acetaminophen (TYLENOL) 500 MG tablet Take 1,000 mg by mouth every 6 (six) hours as needed for moderate pain or headache.     .  alendronate (FOSAMAX) 70 MG tablet Take 70 mg by mouth once a week. Take with a full glass of water on an empty stomach.    . allopurinol (ZYLOPRIM) 100 MG tablet Take 100 mg by mouth daily.    . Cannabidiol 100 MG/ML SOLN Marland Kitchenake by mouth.    Marland Kitchen. FLUoxetine (PROZAC) 20 MG tablet Take 20 mg by mouth daily.    . folic acid (FOLVITE) 1 MG tablet Take 1 mg by mouth daily.    Marland Kitchen. gabapentin (NEURONTIN) 300 MG capsule Take 300 mg by mouth 3 (three) times daily.    Marland Kitchen. lisinopril (PRINIVIL,ZESTRIL) 10 MG tablet Take 10 mg by mouth daily.     Marland Kitchen. loratadine (CLARITIN) 10 MG tablet Take 10 mg by mouth daily.    . Multiple Vitamin (MULTIVITAMIN WITH MINERALS) TABS Take 1 tablet by mouth daily.    . Multiple Vitamins-Minerals (AIRBORNE GUMMIES PO) Take 1 each by mouth daily.    Marland Kitchen. PROAIR HFA 108 (90 Base) MCG/ACT inhaler Inhale 2 puffs into the lungs every 6 (six) hours as needed for shortness of breath or wheezing.  3  . Vitamin D, Ergocalciferol, (DRISDOL) 1.25 MG (50000 UT) CAPS capsule Take 1 capsule (50,000 Units total) by mouth every 7 (seven) days. 4 capsule 0   No current facility-administered medications on file prior to visit.     PAST MEDICAL HISTORY: Past Medical History:  Diagnosis Date  . Anxiety   . Arthritis   . Asthma   . Chronic pain   . Chronic pain   . Family history of adverse reaction to anesthesia    PONV for pt mother  . Heart murmur   . Hemolytic anemia (HCC)   . Hyperlipidemia   . Hypertension   . Joint pain   . Knee pain, bilateral   . Lower extremity edema   . Mass    right middle finger  . Morbid obesity (HCC)   . Osteoarthritis   . Osteoporosis   . Perirectal abscess   . Venous stasis     PAST SURGICAL HISTORY: Past Surgical History:  Procedure Laterality Date  . COLONOSCOPY    . INCISION AND DRAINAGE PERIRECTAL ABSCESS  05/25/2012   Procedure: IRRIGATION AND DEBRIDEMENT PERIRECTAL ABSCESS;  Surgeon: Valarie MerinoMatthew B Martin, MD;  Location: [facility];  Service: General;   Laterality: N/A;  . MASS EXCISION Right 01/01/2018   Procedure: EXCISION MASS RIGHT MIDDLE FINGER;  Surgeon: Cindee SaltKuzma, Gary, MD;  Location: [facility];  Service: Orthopedics;  Laterality: Right;    SOCIAL HISTORY: Social History   Tobacco Use  . Smoking status: Former Smoker    Types: Cigarettes  . Smokeless tobacco: Never Used  . Tobacco comment: 07/01/1976  Substance Use Topics  . Alcohol use: No    Frequency: Never  . Drug use: No    FAMILY HISTORY: Family History  Problem Relation Age of Onset  . Diabetes Father   . Hyperlipidemia Father   . Hypertension Father   . Heart disease Father   . Kidney disease Father   . Obesity Father   .  Diabetes Other    ROS: Review of Systems  Cardiovascular:       Positive for bilateral lower extremity edema.   PHYSICAL EXAM: Pt in no acute distress  RECENT LABS AND TESTS: BMET    Component Value Date/Time   NA 140 12/30/2018 1155   Stone 4.6 12/30/2018 1155   CL 101 12/30/2018 1155   CO2 24 12/30/2018 1155   GLUCOSE 89 12/30/2018 1155   GLUCOSE 93 01/01/2018 1312   BUN 29 (H) 12/30/2018 1155   CREATININE 1.14 (H) 12/30/2018 1155   CALCIUM 9.7 12/30/2018 1155   GFRNONAA 51 (L) 12/30/2018 1155   GFRAA 59 (L) 12/30/2018 1155   Lab Results  Component Value Date   HGBA1C 5.4 12/30/2018   HGBA1C (L) 03/08/2008    3.6 (NOTE)   The ADA recommends the following therapeutic goals for glycemic   control related to Hgb A1C measurement:   Goal of Therapy:   < 7.0% Hgb A1C   Action Suggested:  > 8.0% Hgb A1C   Ref:  Diabetes Care, 22, Suppl. 1, 1999   Lab Results  Component Value Date   INSULIN 16.9 12/30/2018   CBC    Component Value Date/Time   WBC 6.0 12/30/2018 1155   WBC 6.2 01/01/2018 1215   RBC 4.44 12/30/2018 1155   RBC 4.38 01/01/2018 1215   HGB 13.9 12/30/2018 1155   HGB 15.2 12/12/2008 0804   HCT 41.4 12/30/2018 1155   HCT 43.8 12/12/2008 0804   PLT 161 01/01/2018 1215   PLT 175 12/12/2008 0804   MCV 93 12/30/2018 1155    MCV 90.8 12/12/2008 0804   MCH 31.3 12/30/2018 1155   MCH 31.5 01/01/2018 1215   MCHC 33.6 12/30/2018 1155   MCHC 32.0 01/01/2018 1215   RDW 11.7 12/30/2018 1155   RDW 13.6 12/12/2008 0804   LYMPHSABS 1.4 12/30/2018 1155   LYMPHSABS 1.5 12/12/2008 0804   MONOABS 1.3 (H) 05/20/2012 1348   MONOABS 0.5 12/12/2008 0804   EOSABS 0.1 12/30/2018 1155   BASOSABS 0.1 12/30/2018 1155   BASOSABS 0.0 12/12/2008 0804   Iron/TIBC/Ferritin/ %Sat    Component Value Date/Time   IRON 76 07/21/2008 0753   TIBC 295 07/21/2008 0753   FERRITIN 219 07/21/2008 0753   IRONPCTSAT 26 07/21/2008 0753   Lipid Panel     Component Value Date/Time   CHOL 243 (H) 12/30/2018 1155   TRIG 137 12/30/2018 1155   HDL 47 12/30/2018 1155   CHOLHDL 7.3 03/07/2008 0525   VLDL 38 03/07/2008 0525   LDLCALC 169 (H) 12/30/2018 1155   Hepatic Function Panel     Component Value Date/Time   PROT 6.6 12/30/2018 1155   ALBUMIN 4.3 12/30/2018 1155   AST 21 12/30/2018 1155   ALT 15 12/30/2018 1155   ALKPHOS 101 12/30/2018 1155   BILITOT 0.8 12/30/2018 1155   BILIDIR 0.6 (H) 03/09/2008 0500   IBILI 4.2 (H) 03/06/2008 1500    Results for HARRIET, SUTPHEN (MRN [id number]) as of 05/26/2019 09:53  Ref. Range 12/30/2018 11:55  Vitamin D, 25-Hydroxy Latest Ref Range: 30.0 - 100.0 ng/mL 29.8 (L)   I, Michaelene Song, am acting as Location manager for [employer], MD  .Ferman Hamming

## 2019-06-08 ENCOUNTER — Encounter (INDEPENDENT_AMBULATORY_CARE_PROVIDER_SITE_OTHER): Payer: Self-pay | Admitting: Family Medicine

## 2019-06-08 ENCOUNTER — Telehealth (INDEPENDENT_AMBULATORY_CARE_PROVIDER_SITE_OTHER): Payer: BC Managed Care – PPO | Admitting: Family Medicine

## 2019-06-08 ENCOUNTER — Other Ambulatory Visit: Payer: Self-pay

## 2019-06-08 DIAGNOSIS — R600 Localized edema: Secondary | ICD-10-CM | POA: Diagnosis not present

## 2019-06-08 DIAGNOSIS — Z6841 Body Mass Index (BMI) 40.0 and over, adult: Secondary | ICD-10-CM | POA: Diagnosis not present

## 2019-06-08 DIAGNOSIS — E559 Vitamin D deficiency, unspecified: Secondary | ICD-10-CM

## 2019-06-08 MED ORDER — VITAMIN D (ERGOCALCIFEROL) 1.25 MG (50000 UNIT) PO CAPS: 50000.0000 [IU] | ORAL_CAPSULE | ORAL | 0 refills | Status: DC

## 2019-06-09 NOTE — Progress Notes (Signed)
Office: [phone]  /  Fax: [phone] TeleHealth Visit:  Maria Stone has verbally consented to this TeleHealth visit today. The patient is located at home, the provider is located at the [facility]. The participants in this visit include the listed provider and patient and any and all parties involved. The visit was conducted today via telephone. Maria Stone was unable to use realtime audiovisual technology today (Doxy.me failed) and the telehealth visit was conducted via telephone.  HPI:   Chief Complaint: OBESITY Maria Stone is here to discuss her progress with her obesity treatment plan. She is on the Category 4 plan and is following her eating plan approximately 100 % of the time. She states she is doing more movement overall. Maria Stone continues to do well with weight loss, and she has lost another 2 pounds, even with decreasing lasix. She notes that her hunger is controlled, and she feels better overall. Her exercise is limited by her osteoarthritis. We were unable to weigh the patient today for this TeleHealth visit. She feels as if she has lost weight since her last visit. She has lost 14 lbs since starting treatment with [name].  Bilateral Lower Extremity Edema Maria Stone notes her edema is stable, even with decreasing lasix to 20 mg from 40 mg, due to symptomatic hypotension. She states when she is out in the heat, her legs still swell some, but not when she is in the air conditioning.  Vitamin D deficiency Maria Stone has a diagnosis of vitamin D deficiency. Maria Stone is stable on vit D and she denies nausea, vomiting or muscle weakness.  ASSESSMENT AND PLAN:  Vitamin D deficiency - Plan: Vitamin D, Ergocalciferol, (DRISDOL) 1.25 MG (50000 UT) CAPS capsule  Localized edema  Class 3 severe obesity with serious comorbidity and body mass index (BMI) greater than or equal to 70 in adult, unspecified obesity type (HCC)  PLAN:  Bilateral Lower Extremity Edema  Maria Stone will continue Lasix at 20 mg and she will continue to work on diet and exercise. We will continue to follow and Maria Stone agrees to follow up at the agreed upon time.  Vitamin D Deficiency Maria Stone was informed that low vitamin D levels contributes to fatigue and are associated with obesity, breast, and colon cancer. She agrees to continue to take prescription Vit D @50 ,000 IU every week #4 with no refills and will follow up for routine testing of vitamin D, at least 2-3 times per year. She was informed of the risk of over-replacement of vitamin D and agrees to not increase her dose unless she discusses this with [name] first. Maria Stone agrees to follow up with our clinic in 4 weeks.  I spent > than 50% of the 25 minute visit on counseling as documented in the note.  Obesity Maria Stone is currently in the action stage of change. As such, her goal is to continue with weight loss efforts She has agreed to follow the Category 4 plan Maria Stone has been instructed to work up to a goal of 150 minutes of combined cardio and strengthening exercise per week for weight loss and overall health benefits. We discussed the following Behavioral Modification Strategies today: increase H2O intake and increasing vegetables  Maria Stone has agreed to follow up with our clinic in 4 weeks. She was informed of the importance of frequent follow up visits to maximize her success with intensive lifestyle modifications for her multiple health conditions.  ALLERGIES: Allergies  Allergen Reactions  . Nsaids Anaphylaxis  . Tolmetin Anaphylaxis  .  Contrast Media [Iodinated Diagnostic Agents] Swelling  . Penicillins Other (See Comments)    Childhood allergy  . Codeine Rash    MEDICATIONS: Current Outpatient Medications on File Prior to Visit  Medication Sig Dispense Refill  . acetaminophen (TYLENOL) 500 MG tablet Take 1,000 mg by mouth every 6 (six) hours as needed for moderate pain or headache.     . alendronate  (FOSAMAX) 70 MG tablet Take 70 mg by mouth once a week. Take with a full glass of water on an empty stomach.    Marland Kitchen. allopurinol (ZYLOPRIM) 100 MG tablet Take 100 mg by mouth daily.    . Cannabidiol 100 MG/ML SOLN Take by mouth.    Marland Kitchen. FLUoxetine (PROZAC) 20 MG tablet Take 20 mg by mouth daily.    . folic acid (FOLVITE) 1 MG tablet Take 1 mg by mouth daily.    . furosemide (LASIX) 40 MG tablet Take 0.5 tablets (20 mg total) by mouth daily. 30 tablet 0  . gabapentin (NEURONTIN) 300 MG capsule Take 300 mg by mouth 3 (three) times daily.    Marland Kitchen. lisinopril (PRINIVIL,ZESTRIL) 10 MG tablet Take 10 mg by mouth daily.     Marland Kitchen. loratadine (CLARITIN) 10 MG tablet Take 10 mg by mouth daily.    . Multiple Vitamin (MULTIVITAMIN WITH MINERALS) TABS Take 1 tablet by mouth daily.    . Multiple Vitamins-Minerals (AIRBORNE GUMMIES PO) Take 1 each by mouth daily.    Marland Kitchen. PROAIR HFA 108 (90 Base) MCG/ACT inhaler Inhale 2 puffs into the lungs every 6 (six) hours as needed for shortness of breath or wheezing.  3   No current facility-administered medications on file prior to visit.     PAST MEDICAL HISTORY: Past Medical History:  Diagnosis Date  . Anxiety   . Arthritis   . Asthma   . Chronic pain   . Chronic pain   . Family history of adverse reaction to anesthesia    PONV for pt mother  . Heart murmur   . Hemolytic anemia (HCC)   . Hyperlipidemia   . Hypertension   . Joint pain   . Knee pain, bilateral   . Lower extremity edema   . Mass    right middle finger  . Morbid obesity (HCC)   . Osteoarthritis   . Osteoporosis   . Perirectal abscess   . Venous stasis     PAST SURGICAL HISTORY: Past Surgical History:  Procedure Laterality Date  . COLONOSCOPY    . INCISION AND DRAINAGE PERIRECTAL ABSCESS  05/25/2012   Procedure: IRRIGATION AND DEBRIDEMENT PERIRECTAL ABSCESS;  Surgeon: Valarie MerinoMatthew B Martin, MD;  Location: [facility];  Service: General;  Laterality: N/A;  . MASS EXCISION Right 01/01/2018   Procedure:  EXCISION MASS RIGHT MIDDLE FINGER;  Surgeon: Cindee SaltKuzma, Gary, MD;  Location: [facility];  Service: Orthopedics;  Laterality: Right;    SOCIAL HISTORY: Social History   Tobacco Use  . Smoking status: Former Smoker    Types: Cigarettes  . Smokeless tobacco: Never Used  . Tobacco comment: 07/01/1976  Substance Use Topics  . Alcohol use: No    Frequency: Never  . Drug use: No    FAMILY HISTORY: Family History  Problem Relation Age of Onset  . Diabetes Father   . Hyperlipidemia Father   . Hypertension Father   . Heart disease Father   . Kidney disease Father   . Obesity Father   . Diabetes Other     ROS: Review of Systems  Constitutional: Positive for weight loss.  Gastrointestinal: Negative for nausea and vomiting.  Musculoskeletal:       Positive for Edema (bilateral lower extremities)    PHYSICAL EXAM: Pt in no acute distress  RECENT LABS AND TESTS: BMET    Component Value Date/Time   NA 140 12/30/2018 1155   K 4.6 12/30/2018 1155   CL 101 12/30/2018 1155   CO2 24 12/30/2018 1155   GLUCOSE 89 12/30/2018 1155   GLUCOSE 93 01/01/2018 1312   BUN 29 (H) 12/30/2018 1155   CREATININE 1.14 (H) 12/30/2018 1155   CALCIUM 9.7 12/30/2018 1155   GFRNONAA 51 (L) 12/30/2018 1155   GFRAA 59 (L) 12/30/2018 1155   Lab Results  Component Value Date   HGBA1C 5.4 12/30/2018   HGBA1C (L) 03/08/2008    3.6 (NOTE)   The ADA recommends the following therapeutic goals for glycemic   control related to Hgb A1C measurement:   Goal of Therapy:   < 7.0% Hgb A1C   Action Suggested:  > 8.0% Hgb A1C   Ref:  Diabetes Care, 22, Suppl. 1, 1999   Lab Results  Component Value Date   INSULIN 16.9 12/30/2018   CBC    Component Value Date/Time   WBC 6.0 12/30/2018 1155   WBC 6.2 01/01/2018 1215   RBC 4.44 12/30/2018 1155   RBC 4.38 01/01/2018 1215   HGB 13.9 12/30/2018 1155   HGB 15.2 12/12/2008 0804   HCT 41.4 12/30/2018 1155   HCT 43.8 12/12/2008 0804   PLT 161 01/01/2018 1215   PLT 175  12/12/2008 0804   MCV 93 12/30/2018 1155   MCV 90.8 12/12/2008 0804   MCH 31.3 12/30/2018 1155   MCH 31.5 01/01/2018 1215   MCHC 33.6 12/30/2018 1155   MCHC 32.0 01/01/2018 1215   RDW 11.7 12/30/2018 1155   RDW 13.6 12/12/2008 0804   LYMPHSABS 1.4 12/30/2018 1155   LYMPHSABS 1.5 12/12/2008 0804   MONOABS 1.3 (H) 05/20/2012 1348   MONOABS 0.5 12/12/2008 0804   EOSABS 0.1 12/30/2018 1155   BASOSABS 0.1 12/30/2018 1155   BASOSABS 0.0 12/12/2008 0804   Iron/TIBC/Ferritin/ %Sat    Component Value Date/Time   IRON 76 07/21/2008 0753   TIBC 295 07/21/2008 0753   FERRITIN 219 07/21/2008 0753   IRONPCTSAT 26 07/21/2008 0753   Lipid Panel     Component Value Date/Time   CHOL 243 (H) 12/30/2018 1155   TRIG 137 12/30/2018 1155   HDL 47 12/30/2018 1155   CHOLHDL 7.3 03/07/2008 0525   VLDL 38 03/07/2008 0525   LDLCALC 169 (H) 12/30/2018 1155   Hepatic Function Panel     Component Value Date/Time   PROT 6.6 12/30/2018 1155   ALBUMIN 4.3 12/30/2018 1155   AST 21 12/30/2018 1155   ALT 15 12/30/2018 1155   ALKPHOS 101 12/30/2018 1155   BILITOT 0.8 12/30/2018 1155   BILIDIR 0.6 (H) 03/09/2008 0500   IBILI 4.2 (H) 03/06/2008 1500      Ref. Range 12/30/2018 11:55  Vitamin D, 25-Hydroxy Latest Ref Range: 30.0 - 100.0 ng/mL 29.8 (L)    I, [place] CraneJoanne Murray, am acting as transcriptionist for Quillian Quincearen Galdino Hinchman, MD I have reviewed the above documentation for accuracy and completeness, and I agree with the above. -Quillian Quincearen Jalan Bodi, MD

## 2019-06-13 ENCOUNTER — Other Ambulatory Visit (INDEPENDENT_AMBULATORY_CARE_PROVIDER_SITE_OTHER): Payer: Self-pay | Admitting: Family Medicine

## 2019-06-13 DIAGNOSIS — E559 Vitamin D deficiency, unspecified: Secondary | ICD-10-CM

## 2019-07-06 ENCOUNTER — Encounter (INDEPENDENT_AMBULATORY_CARE_PROVIDER_SITE_OTHER): Payer: Self-pay | Admitting: Family Medicine

## 2019-07-06 ENCOUNTER — Telehealth (INDEPENDENT_AMBULATORY_CARE_PROVIDER_SITE_OTHER): Payer: BC Managed Care – PPO | Admitting: Family Medicine

## 2019-07-06 ENCOUNTER — Other Ambulatory Visit: Payer: Self-pay

## 2019-07-06 DIAGNOSIS — Z6841 Body Mass Index (BMI) 40.0 and over, adult: Secondary | ICD-10-CM | POA: Diagnosis not present

## 2019-07-06 DIAGNOSIS — I10 Essential (primary) hypertension: Secondary | ICD-10-CM

## 2019-07-06 DIAGNOSIS — E559 Vitamin D deficiency, unspecified: Secondary | ICD-10-CM

## 2019-07-06 MED ORDER — VITAMIN D (ERGOCALCIFEROL) 1.25 MG (50000 UNIT) PO CAPS: 50000.0000 [IU] | ORAL_CAPSULE | ORAL | 0 refills | Status: DC

## 2019-07-07 NOTE — Progress Notes (Signed)
Office: [phone]  /  Fax: [phone] TeleHealth Visit:  Maria Stone has verbally consented to this TeleHealth visit today. The patient is located at home, the provider is located at the [facility] office. The participants in this visit include the listed provider and patient. Maria Stone was unable to use realtime audiovisual technology today and the telehealth visit was conducted via telephone.   HPI:   Chief Complaint: OBESITY Maria Stone is here to discuss her progress with her obesity treatment plan. She is on the Category 4 plan and is following her eating plan approximately 99 % of the time. She states she is moving and being more mindful of her steps. Maria Stone states her weight today was 354 lbs, and she feels she is doing well and her energy is better. Her clothes are fitting looser and she is able to move around better. She could fit in her sister's new car without a seatbelt extender and was very happy about that.  We were unable to weigh the patient today for this TeleHealth visit. She feels as if she has maintained her weight since her last visit. She has lost 14 lbs since starting treatment with Maria Stone.  Vitamin D Deficiency Maria Stone has a diagnosis of vitamin D deficiency. She is stable on prescription Vit D and denies nausea, vomiting or muscle weakness. Last Vit D level not yet at goal.  Hypertension Maria Stone is a 63 y.o. female with hypertension. Maria Stone's blood pressure at home mostly ranges around 110-115/70's. She had been running in the 80-90's systolic and we decreased Lasix from 40 mg to 20 mg q daily. She denies headaches, lightheadedness, or dizziness. She is working on weight loss to help control her blood pressure with the goal of decreasing her risk of heart attack and stroke.   ASSESSMENT AND PLAN:  Essential hypertension  Vitamin D deficiency - Plan: Vitamin D, Ergocalciferol, (DRISDOL) 1.25 MG (50000 UT) CAPS capsule  Class 3  severe obesity with serious comorbidity and body mass index (BMI) greater than or equal to 70 in adult, unspecified obesity type (HCC)  PLAN:  Vitamin D Deficiency Maria Stone was informed that low vitamin D levels contributes to fatigue and are associated with obesity, breast, and colon cancer. Maria Stone agrees to continue taking prescription Vit D 50,000 IU every week #4 and we will refill for 1 month. She will follow up for routine testing of vitamin D, at least 2-3 times per year. She was informed of the risk of over-replacement of vitamin D and agrees to not increase her dose unless she discusses this with Maria Stone first. We will recheck labs in 1 month. Maria Stone agrees to follow up with our clinic in 3 weeks.  Hypertension We discussed sodium restriction, working on healthy weight loss, and a regular exercise program as the means to achieve improved blood pressure control. Maria Stone agreed with this plan and agreed to follow up as directed. We will continue to monitor her blood pressure as well as her progress with the above lifestyle modifications. Maria Stone agrees to continue her medications as is and will watch for signs of hypotension as she continues her lifestyle modifications. Maria Stone agrees to follow up with our clinic in 3 weeks.  I spent > than 50% of the 25 minute visit on counseling as documented in the note.  Obesity Maria Stone is currently in the action stage of change. As such, her goal is to continue with weight loss efforts She has agreed to follow the Category 4  plan Maria Stone has been instructed to work up to a goal of 150 minutes of combined cardio and strengthening exercise per week for weight loss and overall health benefits. We discussed the following Behavioral Modification Strategies today: no skipping meals and better snacking choices   Maria Stone has agreed to follow up with our clinic in 3 weeks. She was informed of the importance of frequent follow up visits to maximize her  success with intensive lifestyle modifications for her multiple health conditions.  ALLERGIES: Allergies  Allergen Reactions  . Nsaids Anaphylaxis  . Tolmetin Anaphylaxis  . Contrast Media [Iodinated Diagnostic Agents] Swelling  . Penicillins Other (See Comments)    Childhood allergy  . Codeine Rash    MEDICATIONS: Current Outpatient Medications on File Prior to Visit  Medication Sig Dispense Refill  . acetaminophen (TYLENOL) 500 MG tablet Take 1,000 mg by mouth every 6 (six) hours as needed for moderate pain or headache.     . alendronate (FOSAMAX) 70 MG tablet Take 70 mg by mouth once a week. Take with a full glass of water on an empty stomach.    Marland Kitchen allopurinol (ZYLOPRIM) 100 MG tablet Take 100 mg by mouth daily.    . Cannabidiol 100 MG/ML SOLN Take by mouth.    Marland Kitchen FLUoxetine (PROZAC) 20 MG tablet Take 20 mg by mouth daily.    . folic acid (FOLVITE) 1 MG tablet Take 1 mg by mouth daily.    . furosemide (LASIX) 40 MG tablet Take 0.5 tablets (20 mg total) by mouth daily. 30 tablet 0  . gabapentin (NEURONTIN) 300 MG capsule Take 300 mg by mouth 3 (three) times daily.    Marland Kitchen lisinopril (PRINIVIL,ZESTRIL) 10 MG tablet Take 10 mg by mouth daily.     Marland Kitchen loratadine (CLARITIN) 10 MG tablet Take 10 mg by mouth daily.    . Multiple Vitamin (MULTIVITAMIN WITH MINERALS) TABS Take 1 tablet by mouth daily.    . Multiple Vitamins-Minerals (AIRBORNE GUMMIES PO) Take 1 each by mouth daily.    Marland Kitchen PROAIR HFA 108 (90 Base) MCG/ACT inhaler Inhale 2 puffs into the lungs every 6 (six) hours as needed for shortness of breath or wheezing.  3   No current facility-administered medications on file prior to visit.     PAST MEDICAL HISTORY: Past Medical History:  Diagnosis Date  . Anxiety   . Arthritis   . Asthma   . Chronic pain   . Chronic pain   . Family history of adverse reaction to anesthesia    PONV for pt mother  . Heart murmur   . Hemolytic anemia ([place])   . Hyperlipidemia   . Hypertension   .  Joint pain   . Knee pain, bilateral   . Lower extremity edema   . Mass    right middle finger  . Morbid obesity ([place])   . Osteoarthritis   . Osteoporosis   . Perirectal abscess   . Venous stasis     PAST SURGICAL HISTORY: Past Surgical History:  Procedure Laterality Date  . COLONOSCOPY    . INCISION AND DRAINAGE PERIRECTAL ABSCESS  05/25/2012   Procedure: IRRIGATION AND DEBRIDEMENT PERIRECTAL ABSCESS;  Surgeon: Pedro Earls, MD;  Location: [facility];  Service: General;  Laterality: N/A;  . MASS EXCISION Right 01/01/2018   Procedure: EXCISION MASS RIGHT MIDDLE FINGER;  Surgeon: Daryll Brod, MD;  Location: [place];  Service: Orthopedics;  Laterality: Right;    SOCIAL HISTORY: Social History   Tobacco Use  .  Smoking status: Former Smoker    Types: Cigarettes  . Smokeless tobacco: Never Used  . Tobacco comment: 07/01/1976  Substance Use Topics  . Alcohol use: No    Frequency: Never  . Drug use: No    FAMILY HISTORY: Family History  Problem Relation Age of Onset  . Diabetes Father   . Hyperlipidemia Father   . Hypertension Father   . Heart disease Father   . Kidney disease Father   . Obesity Father   . Diabetes Other     ROS: Review of Systems  Constitutional: Negative for weight loss.  Gastrointestinal: Negative for nausea and vomiting.  Musculoskeletal:       Negative muscle weakness  Neurological: Negative for dizziness and headaches.       Negative lightheadedness    PHYSICAL EXAM: Pt in no acute distress  RECENT LABS AND TESTS: BMET    Component Value Date/Time   NA 140 12/30/2018 1155   K 4.6 12/30/2018 1155   CL 101 12/30/2018 1155   CO2 24 12/30/2018 1155   GLUCOSE 89 12/30/2018 1155   GLUCOSE 93 01/01/2018 1312   BUN 29 (H) 12/30/2018 1155   CREATININE 1.14 (H) 12/30/2018 1155   CALCIUM 9.7 12/30/2018 1155   GFRNONAA 51 (L) 12/30/2018 1155   GFRAA 59 (L) 12/30/2018 1155   Lab Results  Component Value Date   HGBA1C 5.4 12/30/2018   HGBA1C  (L) 03/08/2008    3.6 (NOTE)   The ADA recommends the following therapeutic goals for glycemic   control related to Hgb A1C measurement:   Goal of Therapy:   < 7.0% Hgb A1C   Action Suggested:  > 8.0% Hgb A1C   Ref:  Diabetes Care, 22, Suppl. 1, 1999   Lab Results  Component Value Date   INSULIN 16.9 12/30/2018   CBC    Component Value Date/Time   WBC 6.0 12/30/2018 1155   WBC 6.2 01/01/2018 1215   RBC 4.44 12/30/2018 1155   RBC 4.38 01/01/2018 1215   HGB 13.9 12/30/2018 1155   HGB 15.2 12/12/2008 0804   HCT 41.4 12/30/2018 1155   HCT 43.8 12/12/2008 0804   PLT 161 01/01/2018 1215   PLT 175 12/12/2008 0804   MCV 93 12/30/2018 1155   MCV 90.8 12/12/2008 0804   MCH 31.3 12/30/2018 1155   MCH 31.5 01/01/2018 1215   MCHC 33.6 12/30/2018 1155   MCHC 32.0 01/01/2018 1215   RDW 11.7 12/30/2018 1155   RDW 13.6 12/12/2008 0804   LYMPHSABS 1.4 12/30/2018 1155   LYMPHSABS 1.5 12/12/2008 0804   MONOABS 1.3 (H) 05/20/2012 1348   MONOABS 0.5 12/12/2008 0804   EOSABS 0.1 12/30/2018 1155   BASOSABS 0.1 12/30/2018 1155   BASOSABS 0.0 12/12/2008 0804   Iron/TIBC/Ferritin/ %Sat    Component Value Date/Time   IRON 76 07/21/2008 0753   TIBC 295 07/21/2008 0753   FERRITIN 219 07/21/2008 0753   IRONPCTSAT 26 07/21/2008 0753   Lipid Panel     Component Value Date/Time   CHOL 243 (H) 12/30/2018 1155   TRIG 137 12/30/2018 1155   HDL 47 12/30/2018 1155   CHOLHDL 7.3 03/07/2008 0525   VLDL 38 03/07/2008 0525   LDLCALC 169 (H) 12/30/2018 1155   Hepatic Function Panel     Component Value Date/Time   PROT 6.6 12/30/2018 1155   ALBUMIN 4.3 12/30/2018 1155   AST 21 12/30/2018 1155   ALT 15 12/30/2018 1155   ALKPHOS 101 12/30/2018 1155  BILITOT 0.8 12/30/2018 1155   BILIDIR 0.6 (H) 03/09/2008 0500   IBILI 4.2 (H) 03/06/2008 1500       I, Burt Knack, am acting as transcriptionist for Quillian Quince, MD I have reviewed the above documentation for accuracy and completeness, and  I agree with the above. -Quillian Quince, MD

## 2019-07-10 ENCOUNTER — Other Ambulatory Visit (INDEPENDENT_AMBULATORY_CARE_PROVIDER_SITE_OTHER): Payer: Self-pay | Admitting: Family Medicine

## 2019-07-10 DIAGNOSIS — E559 Vitamin D deficiency, unspecified: Secondary | ICD-10-CM

## 2019-07-17 ENCOUNTER — Other Ambulatory Visit (INDEPENDENT_AMBULATORY_CARE_PROVIDER_SITE_OTHER): Payer: Self-pay | Admitting: Family Medicine

## 2019-07-17 DIAGNOSIS — R600 Localized edema: Secondary | ICD-10-CM

## 2019-07-20 ENCOUNTER — Other Ambulatory Visit (INDEPENDENT_AMBULATORY_CARE_PROVIDER_SITE_OTHER): Payer: Self-pay

## 2019-07-20 DIAGNOSIS — R600 Localized edema: Secondary | ICD-10-CM

## 2019-07-20 MED ORDER — FUROSEMIDE 40 MG PO TABS: 20.0000 mg | ORAL_TABLET | Freq: Every day | ORAL | 0 refills | Status: DC

## 2019-07-27 ENCOUNTER — Telehealth (INDEPENDENT_AMBULATORY_CARE_PROVIDER_SITE_OTHER): Payer: BC Managed Care – PPO | Admitting: Family Medicine

## 2019-07-27 ENCOUNTER — Other Ambulatory Visit: Payer: Self-pay

## 2019-07-27 ENCOUNTER — Encounter (INDEPENDENT_AMBULATORY_CARE_PROVIDER_SITE_OTHER): Payer: Self-pay | Admitting: Family Medicine

## 2019-07-27 DIAGNOSIS — Z6841 Body Mass Index (BMI) 40.0 and over, adult: Secondary | ICD-10-CM

## 2019-07-27 DIAGNOSIS — E559 Vitamin D deficiency, unspecified: Secondary | ICD-10-CM

## 2019-07-27 MED ORDER — VITAMIN D (ERGOCALCIFEROL) 1.25 MG (50000 UNIT) PO CAPS: 50000.0000 [IU] | ORAL_CAPSULE | ORAL | 0 refills | Status: DC

## 2019-08-01 NOTE — Progress Notes (Signed)
Office: [phone]  /  Fax: [phone] TeleHealth Visit:  Maria Stone has verbally consented to this TeleHealth visit today. The patient is located at home, the provider is located at the [facility] office. The participants in this visit include the listed provider and patient. Maria Stone was unable to use realtime audiovisual technology today and the telehealth visit was conducted via telephone.   HPI:   Chief Complaint: OBESITY Maria Stone is here to discuss her progress with her obesity treatment plan. She is on the Category 4 plan and is following her eating plan approximately 100 % of the time. She states she is exercising 0 minutes 0 times per week. Maria Stone continues to do very well with weight loss. She weighed 352 lbs today and has lost another 2 lbs. She has been working on being more mobile and walking with a cane more, and using motorized wheelchair less. She states her blood pressure was 103/53 and pulse of 77.   We were unable to weigh the patient today for this TeleHealth visit. She feels as if she has lost 2 lbs since her last visit. She has lost 14 lbs since starting treatment with [name].  Vitamin D Deficiency Maria Stone has a diagnosis of vitamin D deficiency. She is stable on prescription Vit D, but last level is not yet at goal. She denies nausea, vomiting or muscle weakness.  ASSESSMENT AND PLAN:  Vitamin D deficiency - Plan: Vitamin D, Ergocalciferol, (DRISDOL) 1.25 MG (50000 UT) CAPS capsule  Class 3 severe obesity with serious comorbidity and body mass index (BMI) greater than or equal to 70 in adult, unspecified obesity type (HCC)  PLAN:  Vitamin D Deficiency Maria Stone was informed that low vitamin D levels contributes to fatigue and are associated with obesity, breast, and colon cancer. [name] agrees to continue taking prescription Vit D 50,000 IU every week #4 and we will refill for 1 month. She will follow up for routine testing of vitamin D,  at least 2-3 times per year. She was informed of the risk of over-replacement of vitamin D and agrees to not increase her dose unless she discusses this with [name] first. We will check labs as soon as she is ok to have an in office visit. Maria Stone agrees to follow up with our clinic in 3 weeks.  Obesity Maria Stone is currently in the action stage of change. As such, her goal is to continue with weight loss efforts She has agreed to follow the Category 4 plan Maria Stone has been instructed to work up to a goal of 150 minutes of combined cardio and strengthening exercise per week or as is for weight loss and overall health benefits. We discussed the following Behavioral Modification Strategies today: increasing lean protein intake and work on meal planning and easy cooking plans   Maria Stone has agreed to follow up with our clinic in 3 weeks. She was informed of the importance of frequent follow up visits to maximize her success with intensive lifestyle modifications for her multiple health conditions.  ALLERGIES: Allergies  Allergen Reactions  . Nsaids Anaphylaxis  . Tolmetin Anaphylaxis  . Contrast Media [Iodinated Diagnostic Agents] Swelling  . Penicillins Other (See Comments)    Childhood allergy  . Codeine Rash    MEDICATIONS: Current Outpatient Medications on File Prior to Visit  Medication Sig Dispense Refill  . acetaminophen (TYLENOL) 500 MG tablet Take 1,000 mg by mouth every 6 (six) hours as needed for moderate pain or headache.     Marland Kitchen  alendronate (FOSAMAX) 70 MG tablet Take 70 mg by mouth once a week. Take with a full glass of water on an empty stomach.    Marland Kitchen allopurinol (ZYLOPRIM) 100 MG tablet Take 100 mg by mouth daily.    . Cannabidiol 100 MG/ML SOLN Take by mouth.    Marland Kitchen FLUoxetine (PROZAC) 20 MG tablet Take 20 mg by mouth daily.    . folic acid (FOLVITE) 1 MG tablet Take 1 mg by mouth daily.    . furosemide (LASIX) 40 MG tablet Take 0.5 tablets (20 mg total) by mouth daily. 20  tablet 0  . gabapentin (NEURONTIN) 300 MG capsule Take 300 mg by mouth 3 (three) times daily.    Marland Kitchen lisinopril (PRINIVIL,ZESTRIL) 10 MG tablet Take 10 mg by mouth daily.     Marland Kitchen loratadine (CLARITIN) 10 MG tablet Take 10 mg by mouth daily.    . Multiple Vitamin (MULTIVITAMIN WITH MINERALS) TABS Take 1 tablet by mouth daily.    . Multiple Vitamins-Minerals (AIRBORNE GUMMIES PO) Take 1 each by mouth daily.    Marland Kitchen PROAIR HFA 108 (90 Base) MCG/ACT inhaler Inhale 2 puffs into the lungs every 6 (six) hours as needed for shortness of breath or wheezing.  3   No current facility-administered medications on file prior to visit.     PAST MEDICAL HISTORY: Past Medical History:  Diagnosis Date  . Anxiety   . Arthritis   . Asthma   . Chronic pain   . Chronic pain   . Family history of adverse reaction to anesthesia    PONV for pt mother  . Heart murmur   . Hemolytic anemia (HCC)   . Hyperlipidemia   . Hypertension   . Joint pain   . Knee pain, bilateral   . Lower extremity edema   . Mass    right middle finger  . Morbid obesity (HCC)   . Osteoarthritis   . Osteoporosis   . Perirectal abscess   . Venous stasis     PAST SURGICAL HISTORY: Past Surgical History:  Procedure Laterality Date  . COLONOSCOPY    . INCISION AND DRAINAGE PERIRECTAL ABSCESS  05/25/2012   Procedure: IRRIGATION AND DEBRIDEMENT PERIRECTAL ABSCESS;  Surgeon: Valarie Merino, MD;  Location: [facility];  Service: General;  Laterality: N/A;  . MASS EXCISION Right 01/01/2018   Procedure: EXCISION MASS RIGHT MIDDLE FINGER;  Surgeon: Cindee Salt, MD;  Location: [facility];  Service: Orthopedics;  Laterality: Right;    SOCIAL HISTORY: Social History   Tobacco Use  . Smoking status: Former Smoker    Types: Cigarettes  . Smokeless tobacco: Never Used  . Tobacco comment: 07/01/1976  Substance Use Topics  . Alcohol use: No    Frequency: Never  . Drug use: No    FAMILY HISTORY: Family History  Problem Relation Age of Onset  .  Diabetes Father   . Hyperlipidemia Father   . Hypertension Father   . Heart disease Father   . Kidney disease Father   . Obesity Father   . Diabetes Other     ROS: Review of Systems  Constitutional: Positive for weight loss.  Gastrointestinal: Negative for nausea and vomiting.  Musculoskeletal:       Negative muscle weakness    PHYSICAL EXAM: Pt in no acute distress  RECENT LABS AND TESTS: BMET    Component Value Date/Time   NA 140 12/30/2018 1155   K 4.6 12/30/2018 1155   CL 101 12/30/2018 1155   CO2  24 12/30/2018 1155   GLUCOSE 89 12/30/2018 1155   GLUCOSE 93 01/01/2018 1312   BUN 29 (H) 12/30/2018 1155   CREATININE 1.14 (H) 12/30/2018 1155   CALCIUM 9.7 12/30/2018 1155   GFRNONAA 51 (L) 12/30/2018 1155   GFRAA 59 (L) 12/30/2018 1155   Lab Results  Component Value Date   HGBA1C 5.4 12/30/2018   HGBA1C (L) 03/08/2008    3.6 (NOTE)   The ADA recommends the following therapeutic goals for glycemic   control related to Hgb A1C measurement:   Goal of Therapy:   < 7.0% Hgb A1C   Action Suggested:  > 8.0% Hgb A1C   Ref:  Diabetes Care, 22, Suppl. 1, 1999   Lab Results  Component Value Date   INSULIN 16.9 12/30/2018   CBC    Component Value Date/Time   WBC 6.0 12/30/2018 1155   WBC 6.2 01/01/2018 1215   RBC 4.44 12/30/2018 1155   RBC 4.38 01/01/2018 1215   HGB 13.9 12/30/2018 1155   HGB 15.2 12/12/2008 0804   HCT 41.4 12/30/2018 1155   HCT 43.8 12/12/2008 0804   PLT 161 01/01/2018 1215   PLT 175 12/12/2008 0804   MCV 93 12/30/2018 1155   MCV 90.8 12/12/2008 0804   MCH 31.3 12/30/2018 1155   MCH 31.5 01/01/2018 1215   MCHC 33.6 12/30/2018 1155   MCHC 32.0 01/01/2018 1215   RDW 11.7 12/30/2018 1155   RDW 13.6 12/12/2008 0804   LYMPHSABS 1.4 12/30/2018 1155   LYMPHSABS 1.5 12/12/2008 0804   MONOABS 1.3 (H) 05/20/2012 1348   MONOABS 0.5 12/12/2008 0804   EOSABS 0.1 12/30/2018 1155   BASOSABS 0.1 12/30/2018 1155   BASOSABS 0.0 12/12/2008 0804    Iron/TIBC/Ferritin/ %Sat    Component Value Date/Time   IRON 76 07/21/2008 0753   TIBC 295 07/21/2008 0753   FERRITIN 219 07/21/2008 0753   IRONPCTSAT 26 07/21/2008 0753   Lipid Panel     Component Value Date/Time   CHOL 243 (H) 12/30/2018 1155   TRIG 137 12/30/2018 1155   HDL 47 12/30/2018 1155   CHOLHDL 7.3 03/07/2008 0525   VLDL 38 03/07/2008 0525   LDLCALC 169 (H) 12/30/2018 1155   Hepatic Function Panel     Component Value Date/Time   PROT 6.6 12/30/2018 1155   ALBUMIN 4.3 12/30/2018 1155   AST 21 12/30/2018 1155   ALT 15 12/30/2018 1155   ALKPHOS 101 12/30/2018 1155   BILITOT 0.8 12/30/2018 1155   BILIDIR 0.6 (H) 03/09/2008 0500   IBILI 4.2 (H) 03/06/2008 1500       I, Trixie Dredge, am acting as transcriptionist for Dennard Nip, MD I have reviewed the above documentation for accuracy and completeness, and I agree with the above. -Dennard Nip, MD

## 2019-08-06 ENCOUNTER — Other Ambulatory Visit (INDEPENDENT_AMBULATORY_CARE_PROVIDER_SITE_OTHER): Payer: Self-pay | Admitting: Family Medicine

## 2019-08-06 DIAGNOSIS — E559 Vitamin D deficiency, unspecified: Secondary | ICD-10-CM

## 2019-08-17 ENCOUNTER — Telehealth (INDEPENDENT_AMBULATORY_CARE_PROVIDER_SITE_OTHER): Payer: BC Managed Care – PPO | Admitting: Bariatrics

## 2019-08-17 ENCOUNTER — Encounter (INDEPENDENT_AMBULATORY_CARE_PROVIDER_SITE_OTHER): Payer: Self-pay | Admitting: Bariatrics

## 2019-08-17 ENCOUNTER — Other Ambulatory Visit: Payer: Self-pay

## 2019-08-17 DIAGNOSIS — E66813 Obesity, class 3: Secondary | ICD-10-CM

## 2019-08-17 DIAGNOSIS — I10 Essential (primary) hypertension: Secondary | ICD-10-CM | POA: Diagnosis not present

## 2019-08-17 DIAGNOSIS — R600 Localized edema: Secondary | ICD-10-CM

## 2019-08-17 DIAGNOSIS — E559 Vitamin D deficiency, unspecified: Secondary | ICD-10-CM

## 2019-08-17 DIAGNOSIS — Z6841 Body Mass Index (BMI) 40.0 and over, adult: Secondary | ICD-10-CM

## 2019-08-17 DIAGNOSIS — E662 Morbid (severe) obesity with alveolar hypoventilation: Secondary | ICD-10-CM | POA: Diagnosis not present

## 2019-08-17 MED ORDER — FUROSEMIDE 40 MG PO TABS: 20.0000 mg | ORAL_TABLET | Freq: Every day | ORAL | 0 refills | Status: DC

## 2019-08-17 NOTE — Progress Notes (Signed)
Office: [phone]  /  Fax: [phone] TeleHealth Visit:  Maria Stone has verbally consented to this TeleHealth visit today. The patient is located at home, the provider is located at the [facility] office. The participants in this visit include the listed provider and patient. The visit was conducted today via telephone call (15 minutes).  HPI:   Chief Complaint: OBESITY Maria Stone is here to discuss her progress with her obesity treatment plan. She is on the Category 4 plan and is following her eating plan approximately 100% of the time. She states she is moving/walking 30 minutes 7 times per week. Kauri states that she has lost 2 lbs and doing well overall (weight 350). She is doing well with her water and protein intake.  We were unable to weigh the patient today for this TeleHealth visit. She feels as if she has lost 2 lbs since her last visit. She has lost 14 lbs since starting treatment with [name].  Hypertension Maria Stone is a 63 y.o. female with hypertension and is taking lisinopril. She was taking Lasix earlier and decreased from 40 mg to 20 mg daily per Dr. Dalbert Garnet.  Maria Stone denies chest pain or shortness of breath on exertion. She is working weight loss to help control her blood pressure with the goal of decreasing her risk of heart attack and stroke. Meaghen's blood pressure is well controlled.  Vitamin D deficiency Maria Stone has a diagnosis of Vitamin D deficiency. She is currently taking Vit D and denies nausea, vomiting or muscle weakness.  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 Alexsis agreed with this plan and agreed to follow up as directed. We will continue to monitor her blood pressure as well as her progress with the above lifestyle modifications. Sharmane was given a prescription for Lasix 0.5 tablets (20 mg total) by mouth daily #30 with 0 refills and agrees to follow-up with our clinic in 2 weeks. She will watch for signs of hypotension as she continues her lifestyle modifications.  Vitamin D Deficiency Maria Stone was informed that low Vitamin D levels contributes to fatigue and are associated with obesity, breast, and colon cancer. She agrees to continue taking Vit D and will follow-up for routine testing of Vitamin D, at least 2-3 times per year. She was informed of the risk of over-replacement of Vitamin D and agrees to not increase her dose unless she discusses this with [name] first. Maria Stone agrees to follow-up with our clinic in 2 weeks.  Obesity Maria Stone is currently in the action stage of change. As such, her goal is to continue with weight loss efforts. She has agreed to follow the Category 4 plan. Maria Stone will work on meal planning and intentional eating. Maria Stone has been instructed to continue walking and will use her cane for weight loss and overall health benefits. We discussed the following Behavioral Modification Strategies today: increasing lean protein intake, decreasing simple carbohydrates, increasing vegetables, increase H20 intake, decrease eating out, no skipping meals, work on meal planning and easy cooking plans, keeping healthy foods in the home, and planning for success.  Maria Stone has agreed  to follow up with our clinic in 2 weeks. She was informed of the importance of frequent follow up visits to maximize her success with intensive lifestyle modifications for her multiple health conditions.  ALLERGIES: Allergies  Allergen Reactions  . Nsaids Anaphylaxis  . Tolmetin Anaphylaxis  . Contrast  Media [Iodinated Diagnostic Agents] Swelling  . Penicillins Other (See Comments)    Childhood allergy  . Codeine Rash    MEDICATIONS: Current Outpatient Medications on File Prior to Visit  Medication Sig Dispense Refill  . acetaminophen (TYLENOL) 500 MG tablet Take 1,000 mg by mouth every 6 (six) hours as needed for moderate pain or headache.     . alendronate (FOSAMAX) 70 MG tablet Take 70 mg by mouth once a week. Take with a full glass of water on an empty stomach.    Marland Kitchen allopurinol (ZYLOPRIM) 100 MG tablet Take 100 mg by mouth daily.    . Cannabidiol 100 MG/ML SOLN Take by mouth.    Marland Kitchen FLUoxetine (PROZAC) 20 MG tablet Take 20 mg by mouth daily.    . folic acid (FOLVITE) 1 MG tablet Take 1 mg by mouth daily.    Marland Kitchen gabapentin (NEURONTIN) 300 MG capsule Take 300 mg by mouth 3 (three) times daily.    Marland Kitchen lisinopril (PRINIVIL,ZESTRIL) 10 MG tablet Take 10 mg by mouth daily.     Marland Kitchen loratadine (CLARITIN) 10 MG tablet Take 10 mg by mouth daily.    . Multiple Vitamin (MULTIVITAMIN WITH MINERALS) TABS Take 1 tablet by mouth daily.    . Multiple Vitamins-Minerals (AIRBORNE GUMMIES PO) Take 1 each by mouth daily.    Marland Kitchen PROAIR HFA 108 (90 Base) MCG/ACT inhaler Inhale 2 puffs into the lungs every 6 (six) hours as needed for shortness of breath or wheezing.  3  . Vitamin D, Ergocalciferol, (DRISDOL) 1.25 MG (50000 UT) CAPS capsule Take 1 capsule (50,000 Units total) by mouth every 7 (seven) days. 4 capsule 0   No current facility-administered medications on file prior to visit.     PAST MEDICAL HISTORY: Past Medical History:  Diagnosis Date  . Anxiety   . Arthritis   . Asthma   . Chronic pain   . Chronic pain   . Family history of adverse reaction to anesthesia    PONV for pt mother  . Heart murmur   . Hemolytic anemia ([place])   . Hyperlipidemia   . Hypertension   . Joint pain   . Knee pain, bilateral   . Lower extremity edema   . Mass    right middle finger  . Morbid obesity ([place])   .  Osteoarthritis   . Osteoporosis   . Perirectal abscess   . Venous stasis     PAST SURGICAL HISTORY: Past Surgical History:  Procedure Laterality Date  . COLONOSCOPY    . INCISION AND DRAINAGE PERIRECTAL ABSCESS  05/25/2012   Procedure: IRRIGATION AND DEBRIDEMENT PERIRECTAL ABSCESS;  Surgeon: Pedro Earls, MD;  Location: [facility];  Service: General;  Laterality: N/A;  . MASS EXCISION Right 01/01/2018   Procedure: EXCISION MASS RIGHT MIDDLE FINGER;  Surgeon: Daryll Brod, MD;  Location: [place];  Service: Orthopedics;  Laterality: Right;    SOCIAL HISTORY: Social History   Tobacco Use  . Smoking status: Former Smoker    Types: Cigarettes  . Smokeless tobacco: Never Used  . Tobacco comment: 07/01/1976  Substance Use Topics  . Alcohol use: No    Frequency: Never  . Drug use: No  FAMILY HISTORY: Family History  Problem Relation Age of Onset  . Diabetes Father   . Hyperlipidemia Father   . Hypertension Father   . Heart disease Father   . Kidney disease Father   . Obesity Father   . Diabetes Other    ROS: Review of Systems  Respiratory: Negative for shortness of breath.   Cardiovascular: Negative for chest pain.  Gastrointestinal: Negative for nausea and vomiting.  Musculoskeletal:       Negative for muscle weakness.   PHYSICAL EXAM: Pt in no acute distress  RECENT LABS AND TESTS: BMET    Component Value Date/Time   NA 140 12/30/2018 1155   K 4.6 12/30/2018 1155   CL 101 12/30/2018 1155   CO2 24 12/30/2018 1155   GLUCOSE 89 12/30/2018 1155   GLUCOSE 93 01/01/2018 1312   BUN 29 (H) 12/30/2018 1155   CREATININE 1.14 (H) 12/30/2018 1155   CALCIUM 9.7 12/30/2018 1155   GFRNONAA 51 (L) 12/30/2018 1155   GFRAA 59 (L) 12/30/2018 1155   Lab Results  Component Value Date   HGBA1C 5.4 12/30/2018   HGBA1C (L) 03/08/2008    3.6 (NOTE)   The ADA recommends the following therapeutic goals for glycemic   control related to Hgb A1C measurement:   Goal of Therapy:   <  7.0% Hgb A1C   Action Suggested:  > 8.0% Hgb A1C   Ref:  Diabetes Care, 22, Suppl. 1, 1999   Lab Results  Component Value Date   INSULIN 16.9 12/30/2018   CBC    Component Value Date/Time   WBC 6.0 12/30/2018 1155   WBC 6.2 01/01/2018 1215   RBC 4.44 12/30/2018 1155   RBC 4.38 01/01/2018 1215   HGB 13.9 12/30/2018 1155   HGB 15.2 12/12/2008 0804   HCT 41.4 12/30/2018 1155   HCT 43.8 12/12/2008 0804   PLT 161 01/01/2018 1215   PLT 175 12/12/2008 0804   MCV 93 12/30/2018 1155   MCV 90.8 12/12/2008 0804   MCH 31.3 12/30/2018 1155   MCH 31.5 01/01/2018 1215   MCHC 33.6 12/30/2018 1155   MCHC 32.0 01/01/2018 1215   RDW 11.7 12/30/2018 1155   RDW 13.6 12/12/2008 0804   LYMPHSABS 1.4 12/30/2018 1155   LYMPHSABS 1.5 12/12/2008 0804   MONOABS 1.3 (H) 05/20/2012 1348   MONOABS 0.5 12/12/2008 0804   EOSABS 0.1 12/30/2018 1155   BASOSABS 0.1 12/30/2018 1155   BASOSABS 0.0 12/12/2008 0804   Iron/TIBC/Ferritin/ %Sat    Component Value Date/Time   IRON 76 07/21/2008 0753   TIBC 295 07/21/2008 0753   FERRITIN 219 07/21/2008 0753   IRONPCTSAT 26 07/21/2008 0753   Lipid Panel     Component Value Date/Time   CHOL 243 (H) 12/30/2018 1155   TRIG 137 12/30/2018 1155   HDL 47 12/30/2018 1155   CHOLHDL 7.3 03/07/2008 0525   VLDL 38 03/07/2008 0525   LDLCALC 169 (H) 12/30/2018 1155   Hepatic Function Panel     Component Value Date/Time   PROT 6.6 12/30/2018 1155   ALBUMIN 4.3 12/30/2018 1155   AST 21 12/30/2018 1155   ALT 15 12/30/2018 1155   ALKPHOS 101 12/30/2018 1155   BILITOT 0.8 12/30/2018 1155   BILIDIR 0.6 (H) 03/09/2008 0500   IBILI 4.2 (H) 03/06/2008 1500    Results for JUDITH, DEMPS (MRN [id number]) as of 08/17/2019 11:41  Ref. Range 12/30/2018 11:55  Vitamin D, 25-Hydroxy Latest Ref Range: 30.0 - 100.0 ng/mL 29.8 (L)  Fernanda DrumI, Denise Haag, am acting as Energy managertranscriptionist for [employer] Energy[employer]el Quadasia Newsham, DO  I have reviewed the above documentation for accuracy and completeness,  and I agree with the above. -Corinna CapraAngel Rahi Chandonnet, DO

## 2019-08-30 ENCOUNTER — Other Ambulatory Visit (INDEPENDENT_AMBULATORY_CARE_PROVIDER_SITE_OTHER): Payer: Self-pay | Admitting: Family Medicine

## 2019-08-30 DIAGNOSIS — E559 Vitamin D deficiency, unspecified: Secondary | ICD-10-CM

## 2019-09-02 ENCOUNTER — Other Ambulatory Visit (INDEPENDENT_AMBULATORY_CARE_PROVIDER_SITE_OTHER): Payer: Self-pay | Admitting: Family Medicine

## 2019-09-02 DIAGNOSIS — E559 Vitamin D deficiency, unspecified: Secondary | ICD-10-CM

## 2019-09-07 ENCOUNTER — Telehealth (INDEPENDENT_AMBULATORY_CARE_PROVIDER_SITE_OTHER): Payer: BC Managed Care – PPO | Admitting: Family Medicine

## 2019-09-07 ENCOUNTER — Encounter (INDEPENDENT_AMBULATORY_CARE_PROVIDER_SITE_OTHER): Payer: Self-pay | Admitting: Family Medicine

## 2019-09-07 ENCOUNTER — Other Ambulatory Visit: Payer: Self-pay

## 2019-09-07 DIAGNOSIS — I10 Essential (primary) hypertension: Secondary | ICD-10-CM | POA: Diagnosis not present

## 2019-09-07 DIAGNOSIS — E559 Vitamin D deficiency, unspecified: Secondary | ICD-10-CM

## 2019-09-07 DIAGNOSIS — Z6841 Body Mass Index (BMI) 40.0 and over, adult: Secondary | ICD-10-CM | POA: Diagnosis not present

## 2019-09-07 MED ORDER — VITAMIN D (ERGOCALCIFEROL) 1.25 MG (50000 UNIT) PO CAPS: 50000.0000 [IU] | ORAL_CAPSULE | ORAL | 0 refills | Status: DC

## 2019-09-07 MED ORDER — FUROSEMIDE 40 MG PO TABS: 20.0000 mg | ORAL_TABLET | Freq: Every day | ORAL | 0 refills | Status: DC

## 2019-09-08 NOTE — Progress Notes (Signed)
Office: [phone]  /  Fax: [phone] TeleHealth Visit:  Maria Stone has verbally consented to this TeleHealth visit today. The patient is located at home, the provider is located at the [facility] office. The participants in this visit include the listed provider and patient. Maria Stone was unable to use realtime audiovisual technology today and the telehealth visit was conducted via telephone.   HPI:   Chief Complaint: OBESITY Maria Stone is here to discuss her progress with her obesity treatment plan. She is on the Category 4 plan and is following her eating plan approximately 90 % of the time. She states she is more mindful of steps and movement. Maria Stone feels she has gained 2 lbs (352 lbs at home), since her last visit. She has had to skip some meals and change her foods due to some financial challenges, but she states she can now get back on track.  We were unable to weigh the patient today for this TeleHealth visit. She feels as if she has gained 2 lbs since her last visit. She has lost 14 lbs since starting treatment with us.  Hypertension Maria Stone is a 63 y.o. female with hypertension. Maria Stone is stable on 20 mg of Lasix. Her blood pressure has been well controlled and she denies chest pain, headaches, or dizziness. She is working on weight loss to help control her blood pressure with the goal of decreasing her risk of heart attack and stroke.  Vitamin D Deficiency Maria Stone has a diagnosis of vitamin D deficiency. Her Vit D level is stable on prescription Vit D, but level is not yet at goal. She is due for labs soon. She denies nausea, vomiting or muscle weakness.  ASSESSMENT AND PLAN:  Essential hypertension - Plan: furosemide (LASIX) 40 MG tablet  Vitamin D deficiency - Plan: Vitamin D, Ergocalciferol, (DRISDOL) 1.25 MG (50000 UT) CAPS capsule  Class 3 severe obesity with serious comorbidity and body mass index (BMI) greater than or equal to  70 in adult, unspecified obesity type (HCC)  PLAN:  Hypertension We discussed sodium restriction, working on healthy weight loss, and a regular exercise program as the means to achieve improved blood pressure control. Maria Stone agreed with this plan and agreed to follow up as directed. We will continue to monitor her blood pressure as well as her progress with the above lifestyle modifications. Maria Stone agrees to continue taking Lasix 40 mg 1/2 tablet (20 mg) PO q daily #20 and we will refill for 1 month. She will watch for signs of hypotension as she continues her lifestyle modifications. She will get labs done at her next visit. Maria Stone agrees to follow up with our clinic in 2 weeks.  Vitamin D Deficiency Maria Stone was informed that low vitamin D levels contributes to fatigue and are associated with obesity, breast, and colon cancer. Maria Stone agrees to continue taking prescription Vit D 50,000 IU every week and will follow up for routine testing of vitamin D, at least 2-3 times per year. She was informed of the risk of over-replacement of vitamin D and agrees to not increase her dose unless she discusses this with us first. She will get labs done at her next visit. Maria Stone agrees to follow up with our clinic in 2 weeks.  I spent > than 50% of the 25 minute visit on counseling as documented in the note.  Obesity Maria Stone is currently in the action stage of change. As such, her goal is to continue with weight loss efforts  She has agreed to follow the Category 4 plan Maria Stone has been instructed to work up to a goal of 150 minutes of combined cardio and strengthening exercise per week for weight loss and overall health benefits. We discussed the following Behavioral Modification Strategies today: increasing lean protein intake and decreasing simple carbohydrates    Maria Stone has agreed to follow up with our clinic in 2 weeks. She was informed of the importance of frequent follow up visits to  maximize her success with intensive lifestyle modifications for her multiple health conditions.  ALLERGIES: Allergies  Allergen Reactions   Nsaids Anaphylaxis   Tolmetin Anaphylaxis   Contrast Media [Iodinated Diagnostic Agents] Swelling   Penicillins Other (See Comments)    Childhood allergy   Codeine Rash    MEDICATIONS: Current Outpatient Medications on File Prior to Visit  Medication Sig Dispense Refill   acetaminophen (TYLENOL) 500 MG tablet Take 1,000 mg by mouth every 6 (six) hours as needed for moderate pain or headache.      alendronate (FOSAMAX) 70 MG tablet Take 70 mg by mouth once a week. Take with a full glass of water on an empty stomach.     allopurinol (ZYLOPRIM) 100 MG tablet Take 100 mg by mouth daily.     Cannabidiol 100 MG/ML SOLN Take by mouth.     FLUoxetine (PROZAC) 20 MG tablet Take 20 mg by mouth daily.     folic acid (FOLVITE) 1 MG tablet Take 1 mg by mouth daily.     gabapentin (NEURONTIN) 300 MG capsule Take 300 mg by mouth 3 (three) times daily.     lisinopril (PRINIVIL,ZESTRIL) 10 MG tablet Take 10 mg by mouth daily.      loratadine (CLARITIN) 10 MG tablet Take 10 mg by mouth daily.     Multiple Vitamin (MULTIVITAMIN WITH MINERALS) TABS Take 1 tablet by mouth daily.     Multiple Vitamins-Minerals (AIRBORNE GUMMIES PO) Take 1 each by mouth daily.     PROAIR HFA 108 (90 Base) MCG/ACT inhaler Inhale 2 puffs into the lungs every 6 (six) hours as needed for shortness of breath or wheezing.  3   No current facility-administered medications on file prior to visit.     PAST MEDICAL HISTORY: Past Medical History:  Diagnosis Date   Anxiety    Arthritis    Asthma    Chronic pain    Chronic pain    Family history of adverse reaction to anesthesia    PONV for pt mother   Heart murmur    Hemolytic anemia ([place])    Hyperlipidemia    Hypertension    Joint pain    Knee pain, bilateral    Lower extremity edema    Mass     right middle finger   Morbid obesity (HCC)    Osteoarthritis    Osteoporosis    Perirectal abscess    Venous stasis     PAST SURGICAL HISTORY: Past Surgical History:  Procedure Laterality Date   COLONOSCOPY     INCISION AND DRAINAGE PERIRECTAL ABSCESS  05/25/2012   Procedure: IRRIGATION AND DEBRIDEMENT PERIRECTAL ABSCESS;  Surgeon: Pedro Earls, MD;  Location: [facility];  Service: General;  Laterality: N/A;   MASS EXCISION Right 01/01/2018   Procedure: EXCISION MASS RIGHT MIDDLE FINGER;  Surgeon: Daryll Brod, MD;  Location: [place];  Service: Orthopedics;  Laterality: Right;    SOCIAL HISTORY: Social History   Tobacco Use   Smoking status: Former Smoker    Types:  Cigarettes   Smokeless tobacco: Never Used   Tobacco comment: 07/01/1976  Substance Use Topics   Alcohol use: No    Frequency: Never   Drug use: No    FAMILY HISTORY: Family History  Problem Relation Age of Onset   Diabetes Father    Hyperlipidemia Father    Hypertension Father    Heart disease Father    Kidney disease Father    Obesity Father    Diabetes Other     ROS: Review of Systems  Constitutional: Negative for weight loss.  Cardiovascular: Negative for chest pain.  Gastrointestinal: Negative for nausea and vomiting.  Musculoskeletal:       Negative muscle weakness  Neurological: Negative for dizziness and headaches.    PHYSICAL EXAM: Pt in no acute distress  RECENT LABS AND TESTS: BMET    Component Value Date/Time   NA 140 12/30/2018 1155   K 4.6 12/30/2018 1155   CL 101 12/30/2018 1155   CO2 24 12/30/2018 1155   GLUCOSE 89 12/30/2018 1155   GLUCOSE 93 01/01/2018 1312   BUN 29 (H) 12/30/2018 1155   CREATININE 1.14 (H) 12/30/2018 1155   CALCIUM 9.7 12/30/2018 1155   GFRNONAA 51 (L) 12/30/2018 1155   GFRAA 59 (L) 12/30/2018 1155   Lab Results  Component Value Date   HGBA1C 5.4 12/30/2018   HGBA1C (L) 03/08/2008    3.6 (NOTE)   The ADA recommends the following  therapeutic goals for glycemic   control related to Hgb A1C measurement:   Goal of Therapy:   < 7.0% Hgb A1C   Action Suggested:  > 8.0% Hgb A1C   Ref:  Diabetes Care, 22, Suppl. 1, 1999   Lab Results  Component Value Date   INSULIN 16.9 12/30/2018   CBC    Component Value Date/Time   WBC 6.0 12/30/2018 1155   WBC 6.2 01/01/2018 1215   RBC 4.44 12/30/2018 1155   RBC 4.38 01/01/2018 1215   HGB 13.9 12/30/2018 1155   HGB 15.2 12/12/2008 0804   HCT 41.4 12/30/2018 1155   HCT 43.8 12/12/2008 0804   PLT 161 01/01/2018 1215   PLT 175 12/12/2008 0804   MCV 93 12/30/2018 1155   MCV 90.8 12/12/2008 0804   MCH 31.3 12/30/2018 1155   MCH 31.5 01/01/2018 1215   MCHC 33.6 12/30/2018 1155   MCHC 32.0 01/01/2018 1215   RDW 11.7 12/30/2018 1155   RDW 13.6 12/12/2008 0804   LYMPHSABS 1.4 12/30/2018 1155   LYMPHSABS 1.5 12/12/2008 0804   MONOABS 1.3 (H) 05/20/2012 1348   MONOABS 0.5 12/12/2008 0804   EOSABS 0.1 12/30/2018 1155   BASOSABS 0.1 12/30/2018 1155   BASOSABS 0.0 12/12/2008 0804   Iron/TIBC/Ferritin/ %Sat    Component Value Date/Time   IRON 76 07/21/2008 0753   TIBC 295 07/21/2008 0753   FERRITIN 219 07/21/2008 0753   IRONPCTSAT 26 07/21/2008 0753   Lipid Panel     Component Value Date/Time   CHOL 243 (H) 12/30/2018 1155   TRIG 137 12/30/2018 1155   HDL 47 12/30/2018 1155   CHOLHDL 7.3 03/07/2008 0525   VLDL 38 03/07/2008 0525   LDLCALC 169 (H) 12/30/2018 1155   Hepatic Function Panel     Component Value Date/Time   PROT 6.6 12/30/2018 1155   ALBUMIN 4.3 12/30/2018 1155   AST 21 12/30/2018 1155   ALT 15 12/30/2018 1155   ALKPHOS 101 12/30/2018 1155   BILITOT 0.8 12/30/2018 1155   BILIDIR 0.6 (H) 03/09/2008  0500   IBILI 4.2 (H) 03/06/2008 1500       I, Burt Knack, am acting as transcriptionist for Quillian Quince, MD I have reviewed the above documentation for accuracy and completeness, and I agree with the above. -Quillian Quince, MD

## 2019-09-26 ENCOUNTER — Other Ambulatory Visit: Payer: Self-pay

## 2019-09-26 ENCOUNTER — Ambulatory Visit (INDEPENDENT_AMBULATORY_CARE_PROVIDER_SITE_OTHER): Payer: BC Managed Care – PPO | Admitting: Family Medicine

## 2019-09-26 ENCOUNTER — Encounter (INDEPENDENT_AMBULATORY_CARE_PROVIDER_SITE_OTHER): Payer: Self-pay | Admitting: Family Medicine

## 2019-09-26 VITALS — BP 148/84 | HR 71 | Temp 98.2°F | Ht 61.0 in | Wt 346.0 lb

## 2019-09-26 DIAGNOSIS — Z9189 Other specified personal risk factors, not elsewhere classified: Secondary | ICD-10-CM | POA: Diagnosis not present

## 2019-09-26 DIAGNOSIS — E559 Vitamin D deficiency, unspecified: Secondary | ICD-10-CM

## 2019-09-26 DIAGNOSIS — E8881 Metabolic syndrome: Secondary | ICD-10-CM

## 2019-09-26 DIAGNOSIS — I10 Essential (primary) hypertension: Secondary | ICD-10-CM

## 2019-09-26 DIAGNOSIS — E7849 Other hyperlipidemia: Secondary | ICD-10-CM | POA: Diagnosis not present

## 2019-09-26 MED ORDER — VITAMIN D (ERGOCALCIFEROL) 1.25 MG (50000 UNIT) PO CAPS: 50000.0000 [IU] | ORAL_CAPSULE | ORAL | 0 refills | Status: DC

## 2019-09-27 LAB — INSULIN, RANDOM: INSULIN: 12.3 u[IU]/mL (ref 2.6–24.9)

## 2019-09-27 LAB — COMPREHENSIVE METABOLIC PANEL
ALT: 15 IU/L (ref 0–32)
AST: 18 IU/L (ref 0–40)
Albumin/Globulin Ratio: 2.1 (ref 1.2–2.2)
Albumin: 4.2 g/dL (ref 3.8–4.8)
Alkaline Phosphatase: 96 IU/L (ref 39–117)
BUN/Creatinine Ratio: 23 (ref 12–28)
BUN: 23 mg/dL (ref 8–27)
Bilirubin Total: 0.5 mg/dL (ref 0.0–1.2)
CO2: 26 mmol/L (ref 20–29)
Calcium: 9.8 mg/dL (ref 8.7–10.3)
Chloride: 102 mmol/L (ref 96–106)
Creatinine, Ser: 1.01 mg/dL — ABNORMAL HIGH (ref 0.57–1.00)
GFR calc Af Amer: 68 mL/min/{1.73_m2} (ref >59)
GFR calc non Af Amer: 59 mL/min/{1.73_m2} — ABNORMAL LOW (ref >59)
Globulin, Total: 2 g/dL (ref 1.5–4.5)
Glucose: 91 mg/dL (ref 65–99)
Potassium: 4.7 mmol/L (ref 3.5–5.2)
Sodium: 141 mmol/L (ref 134–144)
Total Protein: 6.2 g/dL (ref 6.0–8.5)

## 2019-09-27 LAB — LIPID PANEL WITH LDL/HDL RATIO
Cholesterol, Total: 257 mg/dL — ABNORMAL HIGH (ref 100–199)
HDL: 50 mg/dL (ref >39)
LDL Chol Calc (NIH): 184 mg/dL — ABNORMAL HIGH (ref 0–99)
LDL/HDL Ratio: 3.7 ratio — ABNORMAL HIGH (ref 0.0–3.2)
Triglycerides: 129 mg/dL (ref 0–149)
VLDL Cholesterol Cal: 23 mg/dL (ref 5–40)

## 2019-09-27 LAB — HEMOGLOBIN A1C
Est. average glucose Bld gHb Est-mCnc: 105 mg/dL
Hgb A1c MFr Bld: 5.3 % (ref 4.8–5.6)

## 2019-09-27 LAB — VITAMIN D 25 HYDROXY (VIT D DEFICIENCY, FRACTURES): Vit D, 25-Hydroxy: 35 ng/mL (ref 30.0–100.0)

## 2019-09-28 NOTE — Progress Notes (Signed)
Office: [phone]  /  Fax: [phone]   HPI:   Chief Complaint: OBESITY [name] is here to discuss her progress with her obesity treatment plan. She is on the Category 4 plan and is following her eating plan approximately 100 % of the time. She states she is more mindful of her steps. Maria Stone's last visit in the office was 8 months ago. She continues to do well with her weight loss efforts, and she continues to be mindful of her calories. Her hunger is controlled. Her weight is (!) 346 lb (156.9 kg) today and has had a weight loss of 33 pounds over a period of 8 months since her last visit. She has lost 47 lbs since starting treatment with [name].  Vitamin D Deficiency Maria Stone has a diagnosis of vitamin D deficiency. She is currently taking prescription Vit D and she is due to have labs rechecked. She denies nausea, vomiting or muscle weakness.  Hyperlipidemia Maria Stone has hyperlipidemia and has been attempting to improve her cholesterol levels with intensive lifestyle modification including a low saturated fat diet, exercise and weight loss. She denies any chest pain, claudication or myalgias. She is due for labs.  Hypertension Maria Stone is a 63 y.o. female with hypertension. Maria Stone's blood pressure is elevated today. She didn't take her medications as she is fasting. She notes headache but denies chest pain. She is working on weight loss to help control her blood pressure with the goal of decreasing her risk of heart attack and stroke.   At risk for cardiovascular disease Maria Stone is at a higher than average risk for cardiovascular disease due to obesity, hyperlipidemia, and hypertension. She currently denies any chest pain.  Insulin Resistance Maria Stone has a diagnosis of insulin resistance based on her elevated fasting insulin level >5. Although Maria Stone's blood glucose readings are still under good control, insulin resistance puts her at greater risk of metabolic  syndrome and diabetes. She is attempting to improve with diet, and she has done well with diet to decrease risk of diabetes. She is due for labs.  ASSESSMENT AND PLAN:  Vitamin D deficiency - Plan: Vitamin D (25 hydroxy), Vitamin D, Ergocalciferol, (DRISDOL) 1.25 MG (50000 UT) CAPS capsule  Other hyperlipidemia - Plan: Lipid Panel With LDL/HDL Ratio  Insulin resistance - Plan: Comprehensive Metabolic Panel (CMET), HgB A1c, Insulin, random  Essential hypertension  At risk for heart disease  PLAN:  Vitamin D Deficiency Maria Stone was informed that low vitamin D levels contributes to fatigue and are associated with obesity, breast, and colon cancer. Haruka agrees to continue taking prescription Vit D 50,000 IU every week #4 and we will refill for 1 month. She will follow up for routine testing of vitamin D, at least 2-3 times per year. She was informed of the risk of over-replacement of vitamin D and agrees to not increase her dose unless she discusses this with [name] first. We will check labs today. Niyla agrees to follow up with our clinic in 3 weeks.  Hyperlipidemia Maria Stone was informed of the American Heart Association Guidelines emphasizing intensive lifestyle modifications as the first line treatment for hyperlipidemia. We discussed many lifestyle modifications today in depth, and Jabree will continue to work on decreasing saturated fats such as fatty red meat, butter and many fried foods. She will also increase vegetables and lean protein in her diet and continue to work on exercise and weight loss efforts. We will check labs today. Maria Stone agrees to follow up with [name] as directed to  monitor her progress    Hypertension We discussed sodium restriction, working on healthy weight loss, and a regular exercise program as the means to achieve improved blood pressure control. Maria Stone agreed with this plan and agreed to follow up as directed. We will continue to monitor her blood pressure as  well as her progress with the above lifestyle modifications. Maria Stone is to take her medications as soon as she gets home. She will watch for signs of hypotension as she continues her lifestyle modifications. We will recheck her blood pressure at her next visit in 3 weeks.  Cardiovascular risk counseling Maria Stone was given extended (15 minutes) coronary artery disease prevention counseling today. She is 63 y.o. female and has risk factors for heart disease including obesity, hyperlipidemia, and hypertension. We discussed intensive lifestyle modifications today with an emphasis on specific weight loss instructions and strategies. Pt was also informed of the importance of increasing exercise and decreasing saturated fats to help prevent heart disease.  Insulin Resistance Maria Stone will continue to work on weight loss, exercise, and decreasing simple carbohydrates in her diet to help decrease the risk of diabetes. We dicussed metformin including benefits and risks. She was informed that eating too many simple carbohydrates or too many calories at one sitting increases the likelihood of GI side effects. We will check labs today. Maria Stone agrees to follow up with [name] as directed to monitor her progress.  Obesity Maria Stone is currently in the action stage of change. As such, her goal is to continue with weight loss efforts She has agreed to follow the Category 4 plan Maria Stone has been instructed to work up to a goal of 150 minutes of combined cardio and strengthening exercise per week for weight loss and overall health benefits. Exercise bands were given and exercises were shown. We discussed the following Behavioral Modification Strategies today: increasing lean protein intake, decreasing simple carbohydrates  and holiday eating strategies    Maria Stone has agreed to follow up with our clinic in 3 weeks. She was informed of the importance of frequent follow up visits to maximize her success with intensive  lifestyle modifications for her multiple health conditions.  ALLERGIES: Allergies  Allergen Reactions  . Nsaids Anaphylaxis  . Tolmetin Anaphylaxis  . Contrast Media [Iodinated Diagnostic Agents] Swelling  . Penicillins Other (See Comments)    Childhood allergy  . Codeine Rash    MEDICATIONS: Current Outpatient Medications on File Prior to Visit  Medication Sig Dispense Refill  . acetaminophen (TYLENOL) 500 MG tablet Take 1,000 mg by mouth every 6 (six) hours as needed for moderate pain or headache.     . alendronate (FOSAMAX) 70 MG tablet Take 70 mg by mouth once a week. Take with a full glass of water on an empty stomach.    Marland Kitchen allopurinol (ZYLOPRIM) 100 MG tablet Take 100 mg by mouth daily.    . Cannabidiol 100 MG/ML SOLN Take by mouth.    Marland Kitchen FLUoxetine (PROZAC) 20 MG tablet Take 20 mg by mouth daily.    . folic acid (FOLVITE) 1 MG tablet Take 1 mg by mouth daily.    . furosemide (LASIX) 40 MG tablet Take 0.5 tablets (20 mg total) by mouth daily. 20 tablet 0  . gabapentin (NEURONTIN) 300 MG capsule Take 300 mg by mouth 3 (three) times daily.    Marland Kitchen lisinopril (PRINIVIL,ZESTRIL) 10 MG tablet Take 10 mg by mouth daily.     Marland Kitchen loratadine (CLARITIN) 10 MG tablet Take 10 mg by mouth daily.    Marland Kitchen  Multiple Vitamin (MULTIVITAMIN WITH MINERALS) TABS Take 1 tablet by mouth daily.    . Multiple Vitamins-Minerals (AIRBORNE GUMMIES PO) Take 1 each by mouth daily.    Marland Kitchen. PROAIR HFA 108 (90 Base) MCG/ACT inhaler Inhale 2 puffs into the lungs every 6 (six) hours as needed for shortness of breath or wheezing.  3   No current facility-administered medications on file prior to visit.     PAST MEDICAL HISTORY: Past Medical History:  Diagnosis Date  . Anxiety   . Arthritis   . Asthma   . Chronic pain   . Chronic pain   . Family history of adverse reaction to anesthesia    PONV for pt mother  . Heart murmur   . Hemolytic anemia (HCC)   . Hyperlipidemia   . Hypertension   . Joint pain   . Knee  pain, bilateral   . Lower extremity edema   . Mass    right middle finger  . Morbid obesity (HCC)   . Osteoarthritis   . Osteoporosis   . Perirectal abscess   . Venous stasis     PAST SURGICAL HISTORY: Past Surgical History:  Procedure Laterality Date  . COLONOSCOPY    . INCISION AND DRAINAGE PERIRECTAL ABSCESS  05/25/2012   Procedure: IRRIGATION AND DEBRIDEMENT PERIRECTAL ABSCESS;  Surgeon: Valarie MerinoMatthew B Martin, MD;  Location: [facility];  Service: General;  Laterality: N/A;  . MASS EXCISION Right 01/01/2018   Procedure: EXCISION MASS RIGHT MIDDLE FINGER;  Surgeon: Cindee SaltKuzma, Gary, MD;  Location: [facility];  Service: Orthopedics;  Laterality: Right;    SOCIAL HISTORY: Social History   Tobacco Use  . Smoking status: Former Smoker    Types: Cigarettes  . Smokeless tobacco: Never Used  . Tobacco comment: 07/01/1976  Substance Use Topics  . Alcohol use: No    Frequency: Never  . Drug use: No    FAMILY HISTORY: Family History  Problem Relation Age of Onset  . Diabetes Father   . Hyperlipidemia Father   . Hypertension Father   . Heart disease Father   . Kidney disease Father   . Obesity Father   . Diabetes Other     ROS: Review of Systems  Constitutional: Positive for weight loss.  Cardiovascular: Negative for chest pain and claudication.  Gastrointestinal: Negative for nausea and vomiting.  Musculoskeletal: Negative for myalgias.       Negative muscle weakness  Neurological: Positive for headaches.    PHYSICAL EXAM: Blood pressure (!) 148/84, pulse 71, temperature 98.2 F (36.8 C), temperature source Oral, height 5\' 1"  (1.549 m), weight (!) 346 lb (156.9 kg), SpO2 99 %. Body mass index is 65.38 kg/m. Physical Exam Vitals signs reviewed.  Constitutional:      Appearance: Normal appearance. She is obese.  Cardiovascular:     Rate and Rhythm: Normal rate.     Pulses: Normal pulses.  Pulmonary:     Effort: Pulmonary effort is normal.     Breath sounds: Normal breath  sounds.  Musculoskeletal: Normal range of motion.  Skin:    General: Skin is warm and dry.  Neurological:     Mental Status: She is alert and oriented to person, place, and time.  Psychiatric:        Mood and Affect: Mood normal.        Behavior: Behavior normal.     RECENT LABS AND TESTS: BMET    Component Value Date/Time   NA 141 09/26/2019 1428   K 4.7 09/26/2019  1428   CL 102 09/26/2019 1428   CO2 26 09/26/2019 1428   GLUCOSE 91 09/26/2019 1428   GLUCOSE 93 01/01/2018 1312   BUN 23 09/26/2019 1428   CREATININE 1.01 (H) 09/26/2019 1428   CALCIUM 9.8 09/26/2019 1428   GFRNONAA 59 (L) 09/26/2019 1428   GFRAA 68 09/26/2019 1428   Lab Results  Component Value Date   HGBA1C 5.3 09/26/2019   HGBA1C 5.4 12/30/2018   HGBA1C (L) 03/08/2008    3.6 (NOTE)   The ADA recommends the following therapeutic goals for glycemic   control related to Hgb A1C measurement:   Goal of Therapy:   < 7.0% Hgb A1C   Action Suggested:  > 8.0% Hgb A1C   Ref:  Diabetes Care, 22, Suppl. 1, 1999   Lab Results  Component Value Date   INSULIN 12.3 09/26/2019   INSULIN 16.9 12/30/2018   CBC    Component Value Date/Time   WBC 6.0 12/30/2018 1155   WBC 6.2 01/01/2018 1215   RBC 4.44 12/30/2018 1155   RBC 4.38 01/01/2018 1215   HGB 13.9 12/30/2018 1155   HGB 15.2 12/12/2008 0804   HCT 41.4 12/30/2018 1155   HCT 43.8 12/12/2008 0804   PLT 161 01/01/2018 1215   PLT 175 12/12/2008 0804   MCV 93 12/30/2018 1155   MCV 90.8 12/12/2008 0804   MCH 31.3 12/30/2018 1155   MCH 31.5 01/01/2018 1215   MCHC 33.6 12/30/2018 1155   MCHC 32.0 01/01/2018 1215   RDW 11.7 12/30/2018 1155   RDW 13.6 12/12/2008 0804   LYMPHSABS 1.4 12/30/2018 1155   LYMPHSABS 1.5 12/12/2008 0804   MONOABS 1.3 (H) 05/20/2012 1348   MONOABS 0.5 12/12/2008 0804   EOSABS 0.1 12/30/2018 1155   BASOSABS 0.1 12/30/2018 1155   BASOSABS 0.0 12/12/2008 0804   Iron/TIBC/Ferritin/ %Sat    Component Value Date/Time   IRON 76  07/21/2008 0753   TIBC 295 07/21/2008 0753   FERRITIN 219 07/21/2008 0753   IRONPCTSAT 26 07/21/2008 0753   Lipid Panel     Component Value Date/Time   CHOL 257 (H) 09/26/2019 1428   TRIG 129 09/26/2019 1428   HDL 50 09/26/2019 1428   CHOLHDL 7.3 03/07/2008 0525   VLDL 38 03/07/2008 0525   LDLCALC 184 (H) 09/26/2019 1428   Hepatic Function Panel     Component Value Date/Time   PROT 6.2 09/26/2019 1428   ALBUMIN 4.2 09/26/2019 1428   AST 18 09/26/2019 1428   ALT 15 09/26/2019 1428   ALKPHOS 96 09/26/2019 1428   BILITOT 0.5 09/26/2019 1428   BILIDIR 0.6 (H) 03/09/2008 0500   IBILI 4.2 (H) 03/06/2008 1500       OBESITY BEHAVIORAL INTERVENTION VISIT  Today's visit was # 15   Starting weight: 393 lbs Starting date: 12/30/2018 Today's weight : 346 lbs Today's date: 09/26/2019 Total lbs lost to date: 87    ASK: We discussed the diagnosis of obesity with Maria Stone today and Maria Stone agreed to give [name] permission to discuss obesity behavioral modification therapy today.  ASSESS: Maria Stone has the diagnosis of obesity and her BMI today is 65.41 Maria Stone is in the action stage of change   ADVISE: Maria Stone was educated on the multiple health risks of obesity as well as the benefit of weight loss to improve her health. She was advised of the need for long term treatment and the importance of lifestyle modifications to improve her current health and to decrease her risk of  future health problems.  AGREE: Multiple dietary modification options and treatment options were discussed and  Maria Stone agreed to follow the recommendations documented in the above note.  ARRANGE: Elainna was educated on the importance of frequent visits to treat obesity as outlined per CMS and USPSTF guidelines and agreed to schedule her next follow up appointment today.  I, Trixie Dredge, am acting as transcriptionist for Dennard Nip, MD I have reviewed the above documentation for accuracy  and completeness, and I agree with the above. -Dennard Nip, MD

## 2019-09-29 ENCOUNTER — Other Ambulatory Visit (INDEPENDENT_AMBULATORY_CARE_PROVIDER_SITE_OTHER): Payer: Self-pay | Admitting: Family Medicine

## 2019-09-29 DIAGNOSIS — E559 Vitamin D deficiency, unspecified: Secondary | ICD-10-CM

## 2019-10-03 ENCOUNTER — Telehealth (INDEPENDENT_AMBULATORY_CARE_PROVIDER_SITE_OTHER): Payer: Self-pay | Admitting: Family Medicine

## 2019-10-03 NOTE — Telephone Encounter (Signed)
She will be getting her results through mychart soon, but lots of things have improved. Will discuss more in depth at her next visit. Keep up the good work!

## 2019-10-03 NOTE — Telephone Encounter (Signed)
Please advise 

## 2019-10-03 NOTE — Telephone Encounter (Signed)
Lm for pt on identified voicemail answering her question per Dr Leafy Ro about her labs.   Kort Stettler

## 2019-10-18 ENCOUNTER — Ambulatory Visit (INDEPENDENT_AMBULATORY_CARE_PROVIDER_SITE_OTHER): Payer: BC Managed Care – PPO | Admitting: Family Medicine

## 2019-10-18 ENCOUNTER — Other Ambulatory Visit: Payer: Self-pay

## 2019-10-18 ENCOUNTER — Encounter (INDEPENDENT_AMBULATORY_CARE_PROVIDER_SITE_OTHER): Payer: Self-pay | Admitting: Family Medicine

## 2019-10-18 VITALS — BP 143/78 | HR 74 | Temp 98.0°F | Ht 61.0 in | Wt 342.0 lb

## 2019-10-18 DIAGNOSIS — E559 Vitamin D deficiency, unspecified: Secondary | ICD-10-CM | POA: Diagnosis not present

## 2019-10-18 DIAGNOSIS — Z9189 Other specified personal risk factors, not elsewhere classified: Secondary | ICD-10-CM | POA: Diagnosis not present

## 2019-10-18 DIAGNOSIS — R609 Edema, unspecified: Secondary | ICD-10-CM

## 2019-10-18 DIAGNOSIS — Z6841 Body Mass Index (BMI) 40.0 and over, adult: Secondary | ICD-10-CM

## 2019-10-18 MED ORDER — VITAMIN D (ERGOCALCIFEROL) 1.25 MG (50000 UNIT) PO CAPS: 50000.0000 [IU] | ORAL_CAPSULE | ORAL | 0 refills | Status: DC

## 2019-10-18 MED ORDER — FUROSEMIDE 40 MG PO TABS: 20.0000 mg | ORAL_TABLET | Freq: Every day | ORAL | 0 refills | Status: DC

## 2019-10-18 NOTE — Progress Notes (Signed)
Office: [phone]  /  Fax: [phone]   HPI:   Chief Complaint: OBESITY Maria Stone is here to discuss her progress with her obesity treatment plan. She is on the Category 4 plan and is following her eating plan approximately 99% of the time. She states she is exercising with resistance bands 15-20 minutes 5 times per week. Maria Stone continues to do well with weight loss on her Category 4 plan. She notes cravings have decreased and hunger is controlled unless she misses a meal. Her weight is (!) 342 lb (155.1 kg) today and has had a weight loss of 4 pounds over a period of 3 weeks since her last visit. She has lost 51 lbs since starting treatment with Maria Stone.  Vitamin D deficiency Maria Stone has a diagnosis of Vitamin D deficiency, which is slowly improving but not yet at goal. She is currently stable on prescription Vit D and denies nausea, vomiting or muscle weakness. Labs were discussed.  At risk for osteopenia and osteoporosis Maria Stone is at higher risk of osteopenia and osteoporosis due to Vitamin D deficiency.   Edema Kandance is stable on Lasix and is doing well with weight loss and diet. Her renal function is improved with a GFR of 59. Labs were discussed with her.  ASSESSMENT AND PLAN:  Vitamin D deficiency - Plan: Vitamin D, Ergocalciferol, (DRISDOL) 1.25 MG (50000 UT) CAPS capsule  Edema, unspecified type - Plan: furosemide (LASIX) 40 MG tablet  At risk for osteoporosis  Class 3 severe obesity with serious comorbidity and body mass index (BMI) of 60.0 to 69.9 in adult, unspecified obesity type (HCC)  PLAN:  Vitamin D Deficiency [name] was informed that low Vitamin D levels contributes to fatigue and are associated with obesity, breast, and colon cancer. She agrees to continue to take prescription Vit D @ 50,000 IU every week #4 with 0 refills and will follow-up for routine testing of Vitamin D, at least 2-3 times per year. She was informed of the risk of over-replacement  of Vitamin D and agrees to not increase her dose unless she discusses this with Maria Stone first. Maria Stone agrees to follow-up with our clinic in 3 weeks.  At risk for osteopenia and osteoporosis Maria Stone was given extended  (15 minutes) osteoporosis prevention counseling today. Maria Stone is at risk for osteopenia and osteoporosis due to her Vitamin D deficiency. She was encouraged to take her Vitamin D and follow her higher calcium diet and increase strengthening exercise to help strengthen her bones and decrease her risk of osteopenia and osteoporosis.  Edema [name] was given a refill on her Lasix 20 mg #20 with 0 refills. She agrees to follow-up with our clinic in 3 weeks.  Obesity Maria Stone is currently in the action stage of change. As such, her goal is to continue with weight loss efforts. She has agreed to follow the Category 4 plan. Maria Stone has been instructed to work up to a goal of 150 minutes of combined cardio and strengthening exercise per week for weight loss and overall health benefits. We discussed the following Behavioral Modification Strategies today: increasing lean protein intake, decreasing simple carbohydrates, work on meal planning and easy cooking plans.  Maria Stone has agreed to follow-up with our clinic in 3 weeks. She was informed of the importance of frequent follow-up visits to maximize her success with intensive lifestyle modifications for her multiple health conditions.  ALLERGIES: Allergies  Allergen Reactions  . Nsaids Anaphylaxis  . Tolmetin Anaphylaxis  . Contrast Media [Iodinated Diagnostic Agents] Swelling  .  Penicillins Other (See Comments)    Childhood allergy  . Codeine Rash    MEDICATIONS: Current Outpatient Medications on File Prior to Visit  Medication Sig Dispense Refill  . acetaminophen (TYLENOL) 500 MG tablet Take 1,000 mg by mouth every 6 (six) hours as needed for moderate pain or headache.     . alendronate (FOSAMAX) 70 MG tablet Take 70 mg by  mouth once a week. Take with a full glass of water on an empty stomach.    Marland Kitchen allopurinol (ZYLOPRIM) 100 MG tablet Take 100 mg by mouth daily.    . Cannabidiol 100 MG/ML SOLN Take by mouth.    Marland Kitchen FLUoxetine (PROZAC) 20 MG tablet Take 20 mg by mouth daily.    . folic acid (FOLVITE) 1 MG tablet Take 1 mg by mouth daily.    Marland Kitchen gabapentin (NEURONTIN) 300 MG capsule Take 300 mg by mouth 3 (three) times daily.    Marland Kitchen lisinopril (PRINIVIL,ZESTRIL) 10 MG tablet Take 10 mg by mouth daily.     Marland Kitchen loratadine (CLARITIN) 10 MG tablet Take 10 mg by mouth daily.    . Multiple Vitamin (MULTIVITAMIN WITH MINERALS) TABS Take 1 tablet by mouth daily.    . Multiple Vitamins-Minerals (AIRBORNE GUMMIES PO) Take 1 each by mouth daily.    Marland Kitchen PROAIR HFA 108 (90 Base) MCG/ACT inhaler Inhale 2 puffs into the lungs every 6 (six) hours as needed for shortness of breath or wheezing.  3   No current facility-administered medications on file prior to visit.     PAST MEDICAL HISTORY: Past Medical History:  Diagnosis Date  . Anxiety   . Arthritis   . Asthma   . Chronic pain   . Chronic pain   . Family history of adverse reaction to anesthesia    PONV for pt mother  . Heart murmur   . Hemolytic anemia (HCC)   . Hyperlipidemia   . Hypertension   . Joint pain   . Knee pain, bilateral   . Lower extremity edema   . Mass    right middle finger  . Morbid obesity (HCC)   . Osteoarthritis   . Osteoporosis   . Perirectal abscess   . Venous stasis     PAST SURGICAL HISTORY: Past Surgical History:  Procedure Laterality Date  . COLONOSCOPY    . INCISION AND DRAINAGE PERIRECTAL ABSCESS  05/25/2012   Procedure: IRRIGATION AND DEBRIDEMENT PERIRECTAL ABSCESS;  Surgeon: Valarie Merino, MD;  Location: [facility];  Service: General;  Laterality: N/A;  . MASS EXCISION Right 01/01/2018   Procedure: EXCISION MASS RIGHT MIDDLE FINGER;  Surgeon: Cindee Salt, MD;  Location: [facility];  Service: Orthopedics;  Laterality: Right;    SOCIAL  HISTORY: Social History   Tobacco Use  . Smoking status: Former Smoker    Types: Cigarettes  . Smokeless tobacco: Never Used  . Tobacco comment: 07/01/1976  Substance Use Topics  . Alcohol use: No    Frequency: Never  . Drug use: No    FAMILY HISTORY: Family History  Problem Relation Age of Onset  . Diabetes Father   . Hyperlipidemia Father   . Hypertension Father   . Heart disease Father   . Kidney disease Father   . Obesity Father   . Diabetes Other    ROS: Review of Systems  Cardiovascular:       Positive for edema.  Gastrointestinal: Negative for nausea and vomiting.  Musculoskeletal:       Negative for muscle  weakness.   PHYSICAL EXAM: Blood pressure (!) 143/78, pulse 74, temperature 98 F (36.7 C), temperature source Oral, height 5\' 1"  (1.549 m), weight (!) 342 lb (155.1 kg), SpO2 99 %. Body mass index is 64.62 kg/m. Physical Exam Vitals signs reviewed.  Constitutional:      Appearance: Normal appearance. She is obese.  Cardiovascular:     Rate and Rhythm: Normal rate.     Pulses: Normal pulses.  Pulmonary:     Effort: Pulmonary effort is normal.     Breath sounds: Normal breath sounds.  Musculoskeletal: Normal range of motion.  Skin:    General: Skin is warm and dry.  Neurological:     Mental Status: She is alert and oriented to person, place, and time.  Psychiatric:        Behavior: Behavior normal.   RECENT LABS AND TESTS: BMET    Component Value Date/Time   NA 141 09/26/2019 1428   K 4.7 09/26/2019 1428   CL 102 09/26/2019 1428   CO2 26 09/26/2019 1428   GLUCOSE 91 09/26/2019 1428   GLUCOSE 93 01/01/2018 1312   BUN 23 09/26/2019 1428   CREATININE 1.01 (H) 09/26/2019 1428   CALCIUM 9.8 09/26/2019 1428   GFRNONAA 59 (L) 09/26/2019 1428   GFRAA 68 09/26/2019 1428   Lab Results  Component Value Date   HGBA1C 5.3 09/26/2019   HGBA1C 5.4 12/30/2018   HGBA1C (L) 03/08/2008    3.6 (NOTE)   The ADA recommends the following therapeutic  goals for glycemic   control related to Hgb A1C measurement:   Goal of Therapy:   < 7.0% Hgb A1C   Action Suggested:  > 8.0% Hgb A1C   Ref:  Diabetes Care, 22, Suppl. 1, 1999   Lab Results  Component Value Date   INSULIN 12.3 09/26/2019   INSULIN 16.9 12/30/2018   CBC    Component Value Date/Time   WBC 6.0 12/30/2018 1155   WBC 6.2 01/01/2018 1215   RBC 4.44 12/30/2018 1155   RBC 4.38 01/01/2018 1215   HGB 13.9 12/30/2018 1155   HGB 15.2 12/12/2008 0804   HCT 41.4 12/30/2018 1155   HCT 43.8 12/12/2008 0804   PLT 161 01/01/2018 1215   PLT 175 12/12/2008 0804   MCV 93 12/30/2018 1155   MCV 90.8 12/12/2008 0804   MCH 31.3 12/30/2018 1155   MCH 31.5 01/01/2018 1215   MCHC 33.6 12/30/2018 1155   MCHC 32.0 01/01/2018 1215   RDW 11.7 12/30/2018 1155   RDW 13.6 12/12/2008 0804   LYMPHSABS 1.4 12/30/2018 1155   LYMPHSABS 1.5 12/12/2008 0804   MONOABS 1.3 (H) 05/20/2012 1348   MONOABS 0.5 12/12/2008 0804   EOSABS 0.1 12/30/2018 1155   BASOSABS 0.1 12/30/2018 1155   BASOSABS 0.0 12/12/2008 0804   Iron/TIBC/Ferritin/ %Sat    Component Value Date/Time   IRON 76 07/21/2008 0753   TIBC 295 07/21/2008 0753   FERRITIN 219 07/21/2008 0753   IRONPCTSAT 26 07/21/2008 0753   Lipid Panel     Component Value Date/Time   CHOL 257 (H) 09/26/2019 1428   TRIG 129 09/26/2019 1428   HDL 50 09/26/2019 1428   CHOLHDL 7.3 03/07/2008 0525   VLDL 38 03/07/2008 0525   LDLCALC 184 (H) 09/26/2019 1428   Hepatic Function Panel     Component Value Date/Time   PROT 6.2 09/26/2019 1428   ALBUMIN 4.2 09/26/2019 1428   AST 18 09/26/2019 1428   ALT 15 09/26/2019 1428  ALKPHOS 96 09/26/2019 1428   BILITOT 0.5 09/26/2019 1428   BILIDIR 0.6 (H) 03/09/2008 0500   IBILI 4.2 (H) 03/06/2008 1500    Results for NICOLA, HEINEMANN (MRN [id number]) as of 10/18/2019 11:11  Ref. Range 09/26/2019 14:28  Vitamin D, 25-Hydroxy Latest Ref Range: 30.0 - 100.0 ng/mL 35.0   OBESITY BEHAVIORAL INTERVENTION  VISIT  Today's visit was #16  Starting weight: 393 lbs Starting date: 12/30/2018 Today's weight: 342 lbs  Today's date: 10/18/2019 Total lbs lost to date: 51     10/18/2019  Height  (1.549 m)  Weight 342 lb (155.1 kg) (A)  BMI (Calculated) 64.65  BLOOD PRESSURE - SYSTOLIC 143  BLOOD PRESSURE - DIASTOLIC 78   Body Fat % 67.2 %   ASK: We discussed the diagnosis of obesity with [name] today and Toleen agreed to give [name] permission to discuss obesity behavioral modification therapy today.  ASSESS: Veverly has the diagnosis of obesity and her BMI today is 64.7. Denai is in the action stage of change.   ADVISE: Marbella was educated on the multiple health risks of obesity as well as the benefit of weight loss to improve her health. She was advised of the need for long term treatment and the importance of lifestyle modifications to improve her current health and to decrease her risk of future health problems.  AGREE: Multiple dietary modification options and treatment options were discussed and  Kaziah agreed to follow the recommendations documented in the above note.  ARRANGE: Deneice was educated on the importance of frequent visits to treat obesity as outlined per CMS and USPSTF guidelines and agreed to schedule her next follow up appointment today.  I, Marianna Payment, am acting as Energy manager for Quillian Quince, MD  I have reviewed the above documentation for accuracy and completeness, and I agree with the above. -Quillian Quince, MD

## 2019-10-23 ENCOUNTER — Other Ambulatory Visit (INDEPENDENT_AMBULATORY_CARE_PROVIDER_SITE_OTHER): Payer: Self-pay | Admitting: Family Medicine

## 2019-10-23 DIAGNOSIS — E559 Vitamin D deficiency, unspecified: Secondary | ICD-10-CM

## 2019-11-16 ENCOUNTER — Other Ambulatory Visit (INDEPENDENT_AMBULATORY_CARE_PROVIDER_SITE_OTHER): Payer: Self-pay | Admitting: Family Medicine

## 2019-11-16 DIAGNOSIS — E559 Vitamin D deficiency, unspecified: Secondary | ICD-10-CM

## 2019-11-21 ENCOUNTER — Ambulatory Visit (INDEPENDENT_AMBULATORY_CARE_PROVIDER_SITE_OTHER): Payer: BC Managed Care – PPO | Admitting: Family Medicine

## 2019-11-21 ENCOUNTER — Other Ambulatory Visit: Payer: Self-pay

## 2019-11-21 ENCOUNTER — Encounter (INDEPENDENT_AMBULATORY_CARE_PROVIDER_SITE_OTHER): Payer: Self-pay | Admitting: Family Medicine

## 2019-11-21 VITALS — BP 140/68 | HR 73 | Temp 98.1°F | Ht 61.0 in | Wt 340.0 lb

## 2019-11-21 DIAGNOSIS — E559 Vitamin D deficiency, unspecified: Secondary | ICD-10-CM | POA: Diagnosis not present

## 2019-11-21 DIAGNOSIS — R609 Edema, unspecified: Secondary | ICD-10-CM | POA: Diagnosis not present

## 2019-11-21 DIAGNOSIS — Z6841 Body Mass Index (BMI) 40.0 and over, adult: Secondary | ICD-10-CM

## 2019-11-21 DIAGNOSIS — Z9189 Other specified personal risk factors, not elsewhere classified: Secondary | ICD-10-CM

## 2019-11-21 MED ORDER — VITAMIN D (ERGOCALCIFEROL) 1.25 MG (50000 UNIT) PO CAPS: 50000.0000 [IU] | ORAL_CAPSULE | ORAL | 0 refills | Status: DC

## 2019-11-21 MED ORDER — FUROSEMIDE 40 MG PO TABS: 20.0000 mg | ORAL_TABLET | Freq: Every day | ORAL | 0 refills | Status: DC

## 2019-11-23 NOTE — Progress Notes (Signed)
Chief Complaint:   Maria Stone is here to discuss her progress with her Maria treatment plan along with follow-up of her Maria related diagnoses. Maria Stone is on the Category 4 Plan and states she is following her eating plan approximately 95% of the time. Maria Stone states she is walking more and using bands without mobile chair for 15-20 minutes 5 times per week.  Today's visit was #: 85 Starting weight: 393 lbs Starting date: 12/30/2018 Today's weight: 340 lbs Today's date: 11/21/2019 Total lbs lost to date: 46 Total lbs lost since last in-office visit: 2  Interim History: Maria Stone continues to do very well with weight loss even over the holidays. She has done so well, she was able to walk into the clinic using a cane instead of her motorized wheelchair which makes her very happy.  Subjective:   1. Vitamin D deficiency Maria Stone is stable on Vit D, and she denies nausea, vomiting, or muscle weakness. She requests a refill.  2. Edema, unspecified type Maria Stone is stable on Lasix, and she is doing well at her lower dose of 20 mg daily. Since her weight loss efforts her renal function has improved.  3. At risk for heart disease Maria Stone is at a higher than average risk for cardiovascular disease due to Maria. Reviewed: no chest pain on exertion, no dyspnea on exertion, and no swelling of ankles.  Assessment/Plan:   1. Vitamin D deficiency Low Vitamin D level contributes to fatigue and are associated with Maria, breast, and colon cancer. We will refill prescription Vit D, and she will follow-up for routine testing of vitamin D, at least 2-3 times per year to avoid over-replacement.  - Vitamin D, Ergocalciferol, (DRISDOL) 1.25 MG (50000 UNIT) CAPS capsule; Take 1 capsule (50,000 Units total) by mouth every 7 (seven) days.  Dispense: 4 capsule; Refill: 0  2. Edema, unspecified type We will refill Lasix for 1 month. Maria Stone will continue with diet, exercise,  and weight loss.  - furosemide (LASIX) 40 MG tablet; Take 0.5 tablets (20 mg total) by mouth daily.  Dispense: 20 tablet; Refill: 0  3. At risk for heart disease Maria Stone was given approximately 15 minutes of coronary artery disease prevention counseling today. She is 64 y.o. female and has risk factors for heart disease including Maria. We discussed intensive lifestyle modifications today with an emphasis on specific weight loss instructions and strategies.   4. Class 3 severe Maria with serious comorbidity and body mass index (BMI) of 60.0 to 69.9 in adult, unspecified Maria type (HCC) Maria Stone is currently in the action stage of change. As such, her goal is to continue with weight loss efforts. She has agreed to on the Category 4 Plan and keeping a food journal and adhering to recommended goals of 350-550 calories and 35+ grams of protein at supper daily.   We discussed the following exercise goals today: For substantial health benefits, adults should do at least 150 minutes (2 hours and 30 minutes) a week of moderate-intensity, or 75 minutes (1 hour and 15 minutes) a week of vigorous-intensity aerobic physical activity, or an equivalent combination of moderate- and vigorous-intensity aerobic activity. Aerobic activity should be performed in episodes of at least 10 minutes, and preferably, it should be spread throughout the week. Adults should also include muscle-strengthening activities that involve all major muscle groups on 2 or more days a week.  We discussed the following behavioral modification strategies today: no skipping meals.  Maria Stone has agreed  to follow-up with our clinic in 2 to 3 weeks. She was informed of the importance of frequent follow-up visits to maximize her success with intensive lifestyle modifications for her multiple health conditions.   Objective:   Blood pressure 140/68, pulse 73, temperature 98.1 F (36.7 C), temperature source Oral, height 5\' 1"   (1.549 m), weight (!) 340 lb (154.2 kg), SpO2 99 %. Body mass index is 64.24 kg/m.  General: Cooperative, alert, well developed, in no acute distress. HEENT: Conjunctivae and lids unremarkable. Neck: No thyromegaly.  Cardiovascular: Regular rhythm.  Lungs: Normal work of breathing. Extremities: No edema.  Neurologic: No focal deficits.   Lab Results  Component Value Date   CREATININE 1.01 (H) 09/26/2019   BUN 23 09/26/2019   NA 141 09/26/2019   K 4.7 09/26/2019   CL 102 09/26/2019   CO2 26 09/26/2019   Lab Results  Component Value Date   ALT 15 09/26/2019   AST 18 09/26/2019   ALKPHOS 96 09/26/2019   BILITOT 0.5 09/26/2019   Lab Results  Component Value Date   HGBA1C 5.3 09/26/2019   HGBA1C 5.4 12/30/2018   HGBA1C (L) 03/08/2008    3.6 (NOTE)   The ADA recommends the following therapeutic goals for glycemic   control related to Hgb A1C measurement:   Goal of Therapy:   < 7.0% Hgb A1C   Action Suggested:  > 8.0% Hgb A1C   Ref:  Diabetes Care, 22, Suppl. 1, 1999   Lab Results  Component Value Date   INSULIN 12.3 09/26/2019   INSULIN 16.9 12/30/2018   Lab Results  Component Value Date   TSH 1.610 12/30/2018   Lab Results  Component Value Date   CHOL 257 (H) 09/26/2019   HDL 50 09/26/2019   LDLCALC 184 (H) 09/26/2019   TRIG 129 09/26/2019   CHOLHDL 7.3 03/07/2008   Lab Results  Component Value Date   WBC 6.0 12/30/2018   HGB 13.9 12/30/2018   HCT 41.4 12/30/2018   MCV 93 12/30/2018   PLT 161 01/01/2018   Lab Results  Component Value Date   IRON 76 07/21/2008   TIBC 295 07/21/2008   FERRITIN 219 07/21/2008   Attestation Statements:   Reviewed by clinician on day of visit: allergies, medications, problem list, medical history, surgical history, family history, social history, and previous encounter notes.   I, 09/20/2008, am acting as transcriptionist for Burt Knack, MD.  I have reviewed the above documentation for accuracy and completeness,  and I agree with the above. -  Quillian Quince, MD

## 2019-12-11 ENCOUNTER — Other Ambulatory Visit (INDEPENDENT_AMBULATORY_CARE_PROVIDER_SITE_OTHER): Payer: Self-pay | Admitting: Family Medicine

## 2019-12-11 DIAGNOSIS — E559 Vitamin D deficiency, unspecified: Secondary | ICD-10-CM

## 2019-12-12 ENCOUNTER — Encounter (INDEPENDENT_AMBULATORY_CARE_PROVIDER_SITE_OTHER): Payer: Self-pay | Admitting: Family Medicine

## 2019-12-12 ENCOUNTER — Other Ambulatory Visit: Payer: Self-pay

## 2019-12-12 ENCOUNTER — Ambulatory Visit (INDEPENDENT_AMBULATORY_CARE_PROVIDER_SITE_OTHER): Payer: BC Managed Care – PPO | Admitting: Family Medicine

## 2019-12-12 VITALS — BP 151/70 | HR 69 | Temp 98.2°F | Ht 61.0 in | Wt 340.0 lb

## 2019-12-12 DIAGNOSIS — R609 Edema, unspecified: Secondary | ICD-10-CM | POA: Diagnosis not present

## 2019-12-12 DIAGNOSIS — Z9189 Other specified personal risk factors, not elsewhere classified: Secondary | ICD-10-CM | POA: Diagnosis not present

## 2019-12-12 DIAGNOSIS — E559 Vitamin D deficiency, unspecified: Secondary | ICD-10-CM

## 2019-12-12 DIAGNOSIS — I10 Essential (primary) hypertension: Secondary | ICD-10-CM | POA: Diagnosis not present

## 2019-12-12 DIAGNOSIS — Z6841 Body Mass Index (BMI) 40.0 and over, adult: Secondary | ICD-10-CM

## 2019-12-12 MED ORDER — VITAMIN D (ERGOCALCIFEROL) 1.25 MG (50000 UNIT) PO CAPS: 50000.0000 [IU] | ORAL_CAPSULE | ORAL | 0 refills | Status: DC

## 2019-12-12 MED ORDER — FUROSEMIDE 40 MG PO TABS: 20.0000 mg | ORAL_TABLET | Freq: Every day | ORAL | 0 refills | Status: DC

## 2019-12-12 MED ORDER — LISINOPRIL 20 MG PO TABS: 20.0000 mg | ORAL_TABLET | Freq: Every day | ORAL | 0 refills | Status: DC

## 2019-12-12 NOTE — Progress Notes (Signed)
Chief Complaint:   OBESITY Maria Stone is here to discuss her progress with her obesity treatment plan along with follow-up of her obesity related diagnoses. Maria Stone is on the Category 4 Plan and keeping a food journal and adhering to recommended goals of 350-550 calories and 35+ grams of protein at supper daily and states she is following her eating plan approximately 85% of the time. Maria Stone states she is doing more movement and walking.  Today's visit was #: 54 Starting weight: 393 lbs Starting date: 12/30/2018 Today's weight: 340 lbs Today's date: 12/12/2019 Total lbs lost to date: 30 Total lbs lost since last in-office visit: 0  Interim History: Maria Stone has done well maintaining her weight. She is a bit concerned that her weight loss has stalled. Her hunger is controlled and she is following her plan most of the time. She is walking wit a cane more and is using her mobile chair less.  Subjective:   1. Vitamin D deficiency Maria Stone is stable on Vit D, and she denies nausea, vomiting, or muscle weakness.  2. Essential hypertension Maria Stone's blood pressure is still a bit elevated. She denies chest pain or headaches. Her blood pressure has not been controlled recently.  3. Edema, unspecified type Maria Stone is stable on reduced Lasix dose, with improved GFR. Her electrolytes were within normal limits.  4. At risk for heart disease Maria Stone is at a higher than average risk for cardiovascular disease due to obesity. Reviewed: no chest pain on exertion, no dyspnea on exertion, and no swelling of ankles.  Assessment/Plan:   1. Vitamin D deficiency Low Vitamin D level contributes to fatigue and are associated with obesity, breast, and colon cancer. We will refill prescription Vitamin D for 1 month. Maria Stone will follow-up for routine testing of Vitamin D, at least 2-3 times per year to avoid over-replacement. We will recheck labs in 1 month.  - Vitamin D, Ergocalciferol, (DRISDOL)  1.25 MG (50000 UNIT) CAPS capsule; Take 1 capsule (50,000 Units total) by mouth every 7 (seven) days.  Dispense: 4 capsule; Refill: 0  2. Essential hypertension Maria Stone is working on healthy weight loss and exercise to improve blood pressure control. We will watch for signs of hypotension as she continues her lifestyle modifications. Maria Stone agreed to increase lisinopril to 20 mg daily. We will recheck her blood pressure in 3 weeks.  - lisinopril (ZESTRIL) 20 MG tablet; Take 1 tablet (20 mg total) by mouth daily.  Dispense: 30 tablet; Refill: 0  3. Edema, unspecified type We will refill Lasix for 1 month, and will recheck labs in 1 month.  - furosemide (LASIX) 40 MG tablet; Take 0.5 tablets (20 mg total) by mouth daily.  Dispense: 20 tablet; Refill: 0  4. At risk for heart disease Maria Stone was given approximately 15 minutes of coronary artery disease prevention counseling today. She is 64 y.o. female and has risk factors for heart disease including obesity. We discussed intensive lifestyle modifications today with an emphasis on specific weight loss instructions and strategies.   Repetitive spaced learning was employed today to elicit superior memory formation and behavioral change.  5. Class 3 severe obesity with serious comorbidity and body mass index (BMI) of 60.0 to 69.9 in adult, unspecified obesity type (HCC) Maria Stone is currently in the action stage of change. As such, her goal is to continue with weight loss efforts. She has agreed to the Category 4 Plan.   Exercise goals: No exercise has been prescribed at this time.  Behavioral modification  strategies: meal planning and cooking strategies and emotional eating strategies.  Maria Stone has agreed to follow-up with our clinic in 3 weeks. She was informed of the importance of frequent follow-up visits to maximize her success with intensive lifestyle modifications for her multiple health conditions.   Objective:   Blood pressure (!)  151/70, pulse 69, temperature 98.2 F (36.8 C), temperature source Oral, height 5\' 1"  (1.549 m), weight (!) 340 lb (154.2 kg), SpO2 99 %. Body mass index is 64.24 kg/m.  General: Cooperative, alert, well developed, in no acute distress. HEENT: Conjunctivae and lids unremarkable. Cardiovascular: Regular rhythm.  Lungs: Normal work of breathing. Neurologic: No focal deficits.   Lab Results  Component Value Date   CREATININE 1.01 (H) 09/26/2019   BUN 23 09/26/2019   NA 141 09/26/2019   K 4.7 09/26/2019   CL 102 09/26/2019   CO2 26 09/26/2019   Lab Results  Component Value Date   ALT 15 09/26/2019   AST 18 09/26/2019   ALKPHOS 96 09/26/2019   BILITOT 0.5 09/26/2019   Lab Results  Component Value Date   HGBA1C 5.3 09/26/2019   HGBA1C 5.4 12/30/2018   HGBA1C (L) 03/08/2008    3.6 (NOTE)   The ADA recommends the following therapeutic goals for glycemic   control related to Hgb A1C measurement:   Goal of Therapy:   < 7.0% Hgb A1C   Action Suggested:  > 8.0% Hgb A1C   Ref:  Diabetes Care, 22, Suppl. 1, 1999   Lab Results  Component Value Date   INSULIN 12.3 09/26/2019   INSULIN 16.9 12/30/2018   Lab Results  Component Value Date   TSH 1.610 12/30/2018   Lab Results  Component Value Date   CHOL 257 (H) 09/26/2019   HDL 50 09/26/2019   LDLCALC 184 (H) 09/26/2019   TRIG 129 09/26/2019   CHOLHDL 7.3 03/07/2008   Lab Results  Component Value Date   WBC 6.0 12/30/2018   HGB 13.9 12/30/2018   HCT 41.4 12/30/2018   MCV 93 12/30/2018   PLT 161 01/01/2018   Lab Results  Component Value Date   IRON 76 07/21/2008   TIBC 295 07/21/2008   FERRITIN 219 07/21/2008   Attestation Statements:   Reviewed by clinician on day of visit: allergies, medications, problem list, medical history, surgical history, family history, social history, and previous encounter notes.   I, 09/20/2008, am acting as transcriptionist for Burt Knack, MD.  I have reviewed the above  documentation for accuracy and completeness, and I agree with the above. -  Quillian Quince, MD

## 2020-01-05 ENCOUNTER — Other Ambulatory Visit (INDEPENDENT_AMBULATORY_CARE_PROVIDER_SITE_OTHER): Payer: Self-pay | Admitting: Family Medicine

## 2020-01-05 DIAGNOSIS — I10 Essential (primary) hypertension: Secondary | ICD-10-CM

## 2020-01-09 ENCOUNTER — Ambulatory Visit (INDEPENDENT_AMBULATORY_CARE_PROVIDER_SITE_OTHER): Payer: BC Managed Care – PPO | Admitting: Family Medicine

## 2020-01-09 ENCOUNTER — Other Ambulatory Visit: Payer: Self-pay

## 2020-01-09 ENCOUNTER — Other Ambulatory Visit (INDEPENDENT_AMBULATORY_CARE_PROVIDER_SITE_OTHER): Payer: Self-pay | Admitting: Family Medicine

## 2020-01-09 ENCOUNTER — Encounter (INDEPENDENT_AMBULATORY_CARE_PROVIDER_SITE_OTHER): Payer: Self-pay | Admitting: Family Medicine

## 2020-01-09 VITALS — BP 93/46 | HR 75 | Temp 98.5°F | Ht 61.0 in | Wt 335.0 lb

## 2020-01-09 DIAGNOSIS — Z6841 Body Mass Index (BMI) 40.0 and over, adult: Secondary | ICD-10-CM

## 2020-01-09 DIAGNOSIS — Z9189 Other specified personal risk factors, not elsewhere classified: Secondary | ICD-10-CM

## 2020-01-09 DIAGNOSIS — I10 Essential (primary) hypertension: Secondary | ICD-10-CM | POA: Diagnosis not present

## 2020-01-09 DIAGNOSIS — R600 Localized edema: Secondary | ICD-10-CM | POA: Diagnosis not present

## 2020-01-09 DIAGNOSIS — E559 Vitamin D deficiency, unspecified: Secondary | ICD-10-CM

## 2020-01-09 MED ORDER — VITAMIN D (ERGOCALCIFEROL) 1.25 MG (50000 UNIT) PO CAPS: 50000.0000 [IU] | ORAL_CAPSULE | ORAL | 0 refills | Status: DC

## 2020-01-09 MED ORDER — LISINOPRIL 10 MG PO TABS: 10.0000 mg | ORAL_TABLET | Freq: Every day | ORAL | 0 refills | Status: DC

## 2020-01-09 MED ORDER — FUROSEMIDE 40 MG PO TABS: 20.0000 mg | ORAL_TABLET | Freq: Every day | ORAL | 0 refills | Status: DC

## 2020-01-09 NOTE — Progress Notes (Signed)
Chief Complaint:   OBESITY Maria Stone is here to discuss her progress with her obesity treatment plan along with follow-up of her obesity related diagnoses. Maria Stone is on the Category 4 Plan and states she is following her eating plan approximately 80% of the time. Maria Stone states she is walking and doing arm exercises for 15-20 minutes 3 times per week.  Today's visit was #: 18 Starting weight: 393 lbs Starting date: 12/30/2018 Today's weight: 335 lbs Today's date: 01/09/2020 Total lbs lost to date: 58 Total lbs lost since last in-office visit: 5  Interim History: Danicia continues to do very well with weight loss on her plan. She had some company and had to deal with some sabotage, but she got right back on track with her eating plan.  Subjective:   1. Vitamin D deficiency Maria Stone is stable on Vit D and she denies nausea, vomiting, or muscle weakness.  2. Bilateral leg edema Maria Stone is doing well on her decreased Lasix dose.  3. Essential hypertension Maria Stone denies feeling lightheaded or dizzy, but her blood pressure continue to decrease and she is at risk of hypotension.  4. At risk for complication associated with hypotension The patient is at a higher than average risk of hypotension due to weight loss and low blood pressure.  Assessment/Plan:   1. Vitamin D deficiency Low Vitamin D level contributes to fatigue and are associated with obesity, breast, and colon cancer. We will refill prescription Vitamin D for 1 month. Maria Stone will follow-up for routine testing of Vitamin D, at least 2-3 times per year to avoid over-replacement.  - Vitamin D, Ergocalciferol, (DRISDOL) 1.25 MG (50000 UNIT) CAPS capsule; Take 1 capsule (50,000 Units total) by mouth every 7 (seven) days.  Dispense: 4 capsule; Refill: 0  2. Bilateral leg edema We will refill Lasix for 1 month and will continue to monitor.  - furosemide (LASIX) 40 MG tablet; Take 0.5 tablets (20 mg total) by mouth  daily.  Dispense: 20 tablet; Refill: 0  3. Essential hypertension Maria Stone is working on healthy weight loss and exercise to improve blood pressure control. We will watch for signs of hypotension as she continues her lifestyle modifications. Maria Stone agreed to decrease lisinopril to 10 mg daily with no refill. We will recheck her blood pressure in 3 to 4 weeks.  - lisinopril (ZESTRIL) 10 MG tablet; Take 1 tablet (10 mg total) by mouth daily.  Dispense: 30 tablet; Refill: 0  4. At risk for complication associated with hypotension Maria Stone was given approximately 15 minutes of education and counseling today to help avoid hypotension. We discussed risks of hypotension with weight loss and signs of hypotension such as feeling lightheaded or unsteady.  Repetitive spaced learning was employed today to elicit superior memory formation and behavioral change.  5. Class 3 severe obesity with serious comorbidity and body mass index (BMI) of 60.0 to 69.9 in adult, unspecified obesity type (HCC) Maria Stone is currently in the action stage of change. As such, her goal is to continue with weight loss efforts. She has agreed to the Category 4 Plan.   Exercise goals: Maria Stone will continue her current exercise regimen as is.  Behavioral modification strategies: dealing with family or coworker sabotage.  Maria Stone has agreed to follow-up with our clinic in 3 to 4 weeks. She was informed of the importance of frequent follow-up visits to maximize her success with intensive lifestyle modifications for her multiple health conditions.   Objective:   Blood pressure (!) 93/46, pulse 75,  temperature 98.5 F (36.9 C), temperature source Oral, height 5\' 1"  (1.549 m), weight (!) 335 lb (152 kg), SpO2 95 %. Body mass index is 63.3 kg/m.  General: Cooperative, alert, well developed, in no acute distress. HEENT: Conjunctivae and lids unremarkable. Cardiovascular: Regular rhythm.  Lungs: Normal work of  breathing. Neurologic: No focal deficits.   Lab Results  Component Value Date   CREATININE 1.01 (H) 09/26/2019   BUN 23 09/26/2019   NA 141 09/26/2019   K 4.7 09/26/2019   CL 102 09/26/2019   CO2 26 09/26/2019   Lab Results  Component Value Date   ALT 15 09/26/2019   AST 18 09/26/2019   ALKPHOS 96 09/26/2019   BILITOT 0.5 09/26/2019   Lab Results  Component Value Date   HGBA1C 5.3 09/26/2019   HGBA1C 5.4 12/30/2018   HGBA1C (L) 03/08/2008    3.6 (NOTE)   The ADA recommends the following therapeutic goals for glycemic   control related to Hgb A1C measurement:   Goal of Therapy:   < 7.0% Hgb A1C   Action Suggested:  > 8.0% Hgb A1C   Ref:  Diabetes Care, 22, Suppl. 1, 1999   Lab Results  Component Value Date   INSULIN 12.3 09/26/2019   INSULIN 16.9 12/30/2018   Lab Results  Component Value Date   TSH 1.610 12/30/2018   Lab Results  Component Value Date   CHOL 257 (H) 09/26/2019   HDL 50 09/26/2019   LDLCALC 184 (H) 09/26/2019   TRIG 129 09/26/2019   CHOLHDL 7.3 03/07/2008   Lab Results  Component Value Date   WBC 6.0 12/30/2018   HGB 13.9 12/30/2018   HCT 41.4 12/30/2018   MCV 93 12/30/2018   PLT 161 01/01/2018   Lab Results  Component Value Date   IRON 76 07/21/2008   TIBC 295 07/21/2008   FERRITIN 219 07/21/2008   Attestation Statements:   Reviewed by clinician on day of visit: allergies, medications, problem list, medical history, surgical history, family history, social history, and previous encounter notes.   I, Trixie Dredge, am acting as transcriptionist for Dennard Nip, MD.  I have reviewed the above documentation for accuracy and completeness, and I agree with the above. -  Dennard Nip, MD

## 2020-01-25 ENCOUNTER — Other Ambulatory Visit (INDEPENDENT_AMBULATORY_CARE_PROVIDER_SITE_OTHER): Payer: Self-pay | Admitting: Family Medicine

## 2020-01-25 DIAGNOSIS — I10 Essential (primary) hypertension: Secondary | ICD-10-CM

## 2020-02-02 ENCOUNTER — Other Ambulatory Visit (INDEPENDENT_AMBULATORY_CARE_PROVIDER_SITE_OTHER): Payer: Self-pay | Admitting: Family Medicine

## 2020-02-02 DIAGNOSIS — E559 Vitamin D deficiency, unspecified: Secondary | ICD-10-CM

## 2020-02-06 ENCOUNTER — Ambulatory Visit (INDEPENDENT_AMBULATORY_CARE_PROVIDER_SITE_OTHER): Payer: BC Managed Care – PPO | Admitting: Family Medicine

## 2020-02-06 ENCOUNTER — Encounter (INDEPENDENT_AMBULATORY_CARE_PROVIDER_SITE_OTHER): Payer: Self-pay | Admitting: Family Medicine

## 2020-02-06 ENCOUNTER — Other Ambulatory Visit: Payer: Self-pay

## 2020-02-06 VITALS — BP 136/69 | HR 66 | Temp 98.2°F | Ht 61.0 in | Wt 334.0 lb

## 2020-02-06 DIAGNOSIS — I10 Essential (primary) hypertension: Secondary | ICD-10-CM

## 2020-02-06 DIAGNOSIS — Z9189 Other specified personal risk factors, not elsewhere classified: Secondary | ICD-10-CM

## 2020-02-06 DIAGNOSIS — Z6841 Body Mass Index (BMI) 40.0 and over, adult: Secondary | ICD-10-CM

## 2020-02-06 DIAGNOSIS — E559 Vitamin D deficiency, unspecified: Secondary | ICD-10-CM | POA: Diagnosis not present

## 2020-02-06 MED ORDER — VITAMIN D (ERGOCALCIFEROL) 1.25 MG (50000 UNIT) PO CAPS: 50000.0000 [IU] | ORAL_CAPSULE | ORAL | 0 refills | Status: DC

## 2020-02-06 MED ORDER — FUROSEMIDE 40 MG PO TABS: 20.0000 mg | ORAL_TABLET | Freq: Every day | ORAL | 0 refills | Status: DC

## 2020-02-06 MED ORDER — LISINOPRIL 10 MG PO TABS: 10.0000 mg | ORAL_TABLET | Freq: Every day | ORAL | 0 refills | Status: DC

## 2020-02-06 NOTE — Progress Notes (Signed)
Chief Complaint:   OBESITY Maria Stone is here to discuss her progress with her obesity treatment plan along with follow-up of her obesity related diagnoses. Maria Stone is on the Category 4 Plan and states she is following her eating plan approximately 93% of the time. Maria Stone states she is doing chair exercises for 15 minutes 3 times per week, and is more mindful of her steps.  Today's visit was #: 19 Starting weight: 393 lbs Starting date: 12/30/2018 Today's weight: 334 lbs Today's date: 02/06/2020 Total lbs lost to date: 59 Total lbs lost since last in-office visit: 1  Interim History: Maria Stone reports increase in cravings of diet Dr. Reino Stone. She has received her first COVID-19 vaccination. She reports being able to move better. She plans on starting walking assisted with a walker once the weather improves.  Subjective:   1. Vitamin D deficiency Maria Stone's last Vit D level was 35.0 on 09/26/2019. She is currently on prescription strength Vit D supplementation.  2. Essential hypertension Maria Stone is currently on lisinopril 10 mg and furosemide 40 mg 1/2 tablet q daily. Her last CMP in 09/2019 was stable.  3. At risk for dehydration Maria Stone is at risk for dehydration due to loop diuretic and weight loss.  Assessment/Plan:   1. Vitamin D deficiency Low Vitamin D level contributes to fatigue and are associated with obesity, breast, and colon cancer. We will refill prescription Vitamin D for 1 month. Maria Stone will follow-up for routine testing of Vitamin D, at least 2-3 times per year to avoid over-replacement. We will recheck labs at her next office visit.  - Vitamin D, Ergocalciferol, (DRISDOL) 1.25 MG (50000 UNIT) CAPS capsule; Take 1 capsule (50,000 Units total) by mouth every 7 (seven) days.  Dispense: 4 capsule; Refill: 0  2. Essential hypertension Maria Stone is working on healthy weight loss and exercise to improve blood pressure control. We will watch for signs of hypotension  as she continues her lifestyle modifications. We will refill lisinopril and furosemide for 1 month.  - furosemide (LASIX) 40 MG tablet; Take 0.5 tablets (20 mg total) by mouth daily.  Dispense: 20 tablet; Refill: 0 - lisinopril (ZESTRIL) 10 MG tablet; Take 1 tablet (10 mg total) by mouth daily.  Dispense: 30 tablet; Refill: 0  3. At risk for dehydration Maria Stone was given approximately 15 minutes dehydration prevention counseling today. Maria Stone is at risk for dehydration due to weight loss and current medication(s). She was encouraged to hydrate and monitor fluid status to avoid dehydration as well as weight loss plateaus.   4. Class 3 severe obesity with serious comorbidity and body mass index (BMI) of 60.0 to 69.9 in adult, unspecified obesity type (HCC) Maria Stone is currently in the action stage of change. As such, her goal is to continue with weight loss efforts. She has agreed to the Category 4 Plan.   Exercise goals: As is.  Behavioral modification strategies: dealing with family or coworker sabotage and holiday eating strategies .  Maria Stone has agreed to follow-up with our clinic in 4 weeks. She was informed of the importance of frequent follow-up visits to maximize her success with intensive lifestyle modifications for her multiple health conditions.   Objective:   Blood pressure 136/69, pulse 66, temperature 98.2 F (36.8 C), temperature source Oral, height 5\' 1"  (1.549 m), weight (!) 334 lb (151.5 kg), SpO2 98 %. Body mass index is 63.11 kg/m.  General: Cooperative, alert, well developed, in no acute distress. HEENT: Conjunctivae and lids unremarkable. Cardiovascular: Regular rhythm.  Lungs: Normal work of breathing. Neurologic: No focal deficits.   Lab Results  Component Value Date   CREATININE 1.01 (H) 09/26/2019   BUN 23 09/26/2019   NA 141 09/26/2019   K 4.7 09/26/2019   CL 102 09/26/2019   CO2 26 09/26/2019   Lab Results  Component Value Date   ALT 15  09/26/2019   AST 18 09/26/2019   ALKPHOS 96 09/26/2019   BILITOT 0.5 09/26/2019   Lab Results  Component Value Date   HGBA1C 5.3 09/26/2019   HGBA1C 5.4 12/30/2018   HGBA1C (L) 03/08/2008    3.6 (NOTE)   The ADA recommends the following therapeutic goals for glycemic   control related to Hgb A1C measurement:   Goal of Therapy:   < 7.0% Hgb A1C   Action Suggested:  > 8.0% Hgb A1C   Ref:  Diabetes Care, 22, Suppl. 1, 1999   Lab Results  Component Value Date   INSULIN 12.3 09/26/2019   INSULIN 16.9 12/30/2018   Lab Results  Component Value Date   TSH 1.610 12/30/2018   Lab Results  Component Value Date   CHOL 257 (H) 09/26/2019   HDL 50 09/26/2019   LDLCALC 184 (H) 09/26/2019   TRIG 129 09/26/2019   CHOLHDL 7.3 03/07/2008   Lab Results  Component Value Date   WBC 6.0 12/30/2018   HGB 13.9 12/30/2018   HCT 41.4 12/30/2018   MCV 93 12/30/2018   PLT 161 01/01/2018   Lab Results  Component Value Date   IRON 76 07/21/2008   TIBC 295 07/21/2008   FERRITIN 219 07/21/2008   Attestation Statements:   Reviewed by clinician on day of visit: allergies, medications, problem list, medical history, surgical history, family history, social history, and previous encounter notes.   I, Trixie Dredge, am acting as transcriptionist for Dennard Nip, MD.  I have reviewed the above documentation for accuracy and completeness, and I agree with the above. -  Dennard Nip, MD

## 2020-02-28 ENCOUNTER — Other Ambulatory Visit (INDEPENDENT_AMBULATORY_CARE_PROVIDER_SITE_OTHER): Payer: Self-pay | Admitting: Family Medicine

## 2020-02-28 DIAGNOSIS — E559 Vitamin D deficiency, unspecified: Secondary | ICD-10-CM

## 2020-03-05 ENCOUNTER — Other Ambulatory Visit: Payer: Self-pay

## 2020-03-05 ENCOUNTER — Ambulatory Visit (INDEPENDENT_AMBULATORY_CARE_PROVIDER_SITE_OTHER): Payer: BC Managed Care – PPO | Admitting: Family Medicine

## 2020-03-05 ENCOUNTER — Encounter (INDEPENDENT_AMBULATORY_CARE_PROVIDER_SITE_OTHER): Payer: Self-pay | Admitting: Family Medicine

## 2020-03-05 VITALS — BP 143/75 | HR 78 | Temp 98.4°F | Ht 61.0 in | Wt 337.0 lb

## 2020-03-05 DIAGNOSIS — I10 Essential (primary) hypertension: Secondary | ICD-10-CM

## 2020-03-05 DIAGNOSIS — E7849 Other hyperlipidemia: Secondary | ICD-10-CM | POA: Diagnosis not present

## 2020-03-05 DIAGNOSIS — E559 Vitamin D deficiency, unspecified: Secondary | ICD-10-CM

## 2020-03-05 DIAGNOSIS — Z6841 Body Mass Index (BMI) 40.0 and over, adult: Secondary | ICD-10-CM

## 2020-03-05 DIAGNOSIS — R600 Localized edema: Secondary | ICD-10-CM

## 2020-03-05 DIAGNOSIS — E8881 Metabolic syndrome: Secondary | ICD-10-CM

## 2020-03-05 MED ORDER — FUROSEMIDE 40 MG PO TABS: 20.0000 mg | ORAL_TABLET | Freq: Every day | ORAL | 0 refills | Status: DC

## 2020-03-05 MED ORDER — VITAMIN D (ERGOCALCIFEROL) 1.25 MG (50000 UNIT) PO CAPS: 50000.0000 [IU] | ORAL_CAPSULE | ORAL | 0 refills | Status: DC

## 2020-03-06 LAB — COMPREHENSIVE METABOLIC PANEL
ALT: 15 IU/L (ref 0–32)
AST: 21 IU/L (ref 0–40)
Albumin/Globulin Ratio: 2 (ref 1.2–2.2)
Albumin: 4.2 g/dL (ref 3.8–4.8)
Alkaline Phosphatase: 97 IU/L (ref 39–117)
BUN/Creatinine Ratio: 30 — ABNORMAL HIGH (ref 12–28)
BUN: 30 mg/dL — ABNORMAL HIGH (ref 8–27)
Bilirubin Total: 0.5 mg/dL (ref 0.0–1.2)
CO2: 24 mmol/L (ref 20–29)
Calcium: 9.7 mg/dL (ref 8.7–10.3)
Chloride: 103 mmol/L (ref 96–106)
Creatinine, Ser: 1 mg/dL (ref 0.57–1.00)
GFR calc Af Amer: 69 mL/min/{1.73_m2} (ref >59)
GFR calc non Af Amer: 60 mL/min/{1.73_m2} (ref >59)
Globulin, Total: 2.1 g/dL (ref 1.5–4.5)
Glucose: 96 mg/dL (ref 65–99)
Potassium: 4.3 mmol/L (ref 3.5–5.2)
Sodium: 141 mmol/L (ref 134–144)
Total Protein: 6.3 g/dL (ref 6.0–8.5)

## 2020-03-06 LAB — TSH: TSH: 1.29 u[IU]/mL (ref 0.450–4.500)

## 2020-03-06 LAB — LIPID PANEL WITH LDL/HDL RATIO
Cholesterol, Total: 231 mg/dL — ABNORMAL HIGH (ref 100–199)
HDL: 49 mg/dL (ref >39)
LDL Chol Calc (NIH): 155 mg/dL — ABNORMAL HIGH (ref 0–99)
LDL/HDL Ratio: 3.2 ratio (ref 0.0–3.2)
Triglycerides: 151 mg/dL — ABNORMAL HIGH (ref 0–149)
VLDL Cholesterol Cal: 27 mg/dL (ref 5–40)

## 2020-03-06 LAB — INSULIN, RANDOM: INSULIN: 12.3 u[IU]/mL (ref 2.6–24.9)

## 2020-03-06 LAB — T4, FREE: Free T4: 1.25 ng/dL (ref 0.82–1.77)

## 2020-03-06 LAB — HEMOGLOBIN A1C
Est. average glucose Bld gHb Est-mCnc: 100 mg/dL
Hgb A1c MFr Bld: 5.1 % (ref 4.8–5.6)

## 2020-03-06 LAB — T3: T3, Total: 104 ng/dL (ref 71–180)

## 2020-03-06 LAB — VITAMIN D 25 HYDROXY (VIT D DEFICIENCY, FRACTURES): Vit D, 25-Hydroxy: 47 ng/mL (ref 30.0–100.0)

## 2020-03-06 NOTE — Progress Notes (Signed)
Chief Complaint:   OBESITY Maria Stone is here to discuss her progress with her obesity treatment plan along with follow-up of her obesity related diagnoses. Maria Stone is on the Category 4 Plan and states she is following her eating plan approximately 95% of the time. Maria Stone states she is doing a lot more walking.  Today's visit was #: 20 Starting weight: 393 lbs Starting date: 12/30/2018 Today's weight: 337 lbs Today's date: 03/05/2020 Total lbs lost to date: 56 Total lbs lost since last in-office visit: 0  Interim History: Maria Stone continues to do well with diet and exercise. She is up 3 lbs but feels it is fluid. She continues to increase walking and decrease using her wheelchair, etc. Her hunger is controlled and she is increasing water and working on decreasing sodium.  Subjective:   1. Vitamin D deficiency Yasmin's Vit D level is not yet at goal on Vit D. She denies nausea, vomiting, or muscle weakness.  2. Leg edema, left Maria Stone is up in water weight and she feels she is retaining fluid. She is stable on Lasix and did not increase her dose.  3. Other hyperlipidemia Maria Stone is working on diet, and she is not on statin. She denies chest pain.  4. Essential hypertension Maria Stone's blood pressure is slightly elevated today. She feels she is retaining fluids. She denies chest pain.  5. Insulin resistance Maria Stone is working on diet and weight loss. She notes decreased polyphagia.  Assessment/Plan:   1. Vitamin D deficiency Low Vitamin D level contributes to fatigue and are associated with obesity, breast, and colon cancer. We will refill prescription Vitamin D for 1 month. Caridad will follow-up for routine testing of Vitamin D, at least 2-3 times per year to avoid over-replacement. We will check labs today.  - VITAMIN D 25 Hydroxy (Vit-D Deficiency, Fractures) - T3 - T4, free - TSH  - Vitamin D, Ergocalciferol, (DRISDOL) 1.25 MG (50000 UNIT) CAPS capsule; Take 1  capsule (50,000 Units total) by mouth every 7 (seven) days.  Dispense: 4 capsule; Refill: 0  2. Leg edema, left We will check labs today, and we will refill Lasix for 1 month.  - furosemide (LASIX) 40 MG tablet; Take 0.5 tablets (20 mg total) by mouth daily.  Dispense: 20 tablet; Refill: 0  3. Other hyperlipidemia Cardiovascular risk and specific lipid/LDL goals reviewed. We discussed several lifestyle modifications today and Lake will continue to work on diet, exercise and weight loss efforts. We will check labs today. Orders and follow up as documented in patient record.   - Lipid Panel With LDL/HDL Ratio  4. Essential hypertension Jozlin is working on healthy weight loss, diet, and exercise to improve blood pressure control. We will watch for signs of hypotension as she continues her lifestyle modifications. We will check labs today, and we will recheck her blood pressure in 1 month.  - Comprehensive metabolic panel  5. Insulin resistance Maria Stone will continue to work on weight loss, exercise, and decreasing simple carbohydrates to help decrease the risk of diabetes. We will check labs today. Maria Stone agreed to follow-up with [name] as directed to closely monitor her progress.  - Hemoglobin A1c - Insulin, random  6. Class 3 severe obesity with serious comorbidity and body mass index (BMI) of 60.0 to 69.9 in adult, unspecified obesity type (HCC) Maria Stone is currently in the action stage of change. As such, her goal is to continue with weight loss efforts. She has agreed to the Category 4 Plan.  Exercise goals: As is.  Behavioral modification strategies: decreasing sodium intake.  Maria Stone has agreed to follow-up with our clinic in 4 weeks. She was informed of the importance of frequent follow-up visits to maximize her success with intensive lifestyle modifications for her multiple health conditions.   Maria Stone was informed we would discuss her lab results at her next visit  unless there is a critical issue that needs to be addressed sooner. Maria Stone agreed to keep her next visit at the agreed upon time to discuss these results.  Objective:   Blood pressure (!) 143/75, pulse 78, temperature 98.4 F (36.9 C), temperature source Oral, height 5\' 1"  (1.549 m), weight (!) 337 lb (152.9 kg), SpO2 99 %. Body mass index is 63.68 kg/m.  General: Cooperative, alert, well developed, in no acute distress. HEENT: Conjunctivae and lids unremarkable. Cardiovascular: Regular rhythm.  Lungs: Normal work of breathing. Neurologic: No focal deficits.   Lab Results  Component Value Date   CREATININE 1.00 03/05/2020   BUN 30 (H) 03/05/2020   NA 141 03/05/2020   K 4.3 03/05/2020   CL 103 03/05/2020   CO2 24 03/05/2020   Lab Results  Component Value Date   ALT 15 03/05/2020   AST 21 03/05/2020   ALKPHOS 97 03/05/2020   BILITOT 0.5 03/05/2020   Lab Results  Component Value Date   HGBA1C 5.1 03/05/2020   HGBA1C 5.3 09/26/2019   HGBA1C 5.4 12/30/2018   HGBA1C (L) 03/08/2008    3.6 (NOTE)   The ADA recommends the following therapeutic goals for glycemic   control related to Hgb A1C measurement:   Goal of Therapy:   < 7.0% Hgb A1C   Action Suggested:  > 8.0% Hgb A1C   Ref:  Diabetes Care, 22, Suppl. 1, 1999   Lab Results  Component Value Date   INSULIN 12.3 03/05/2020   INSULIN 12.3 09/26/2019   INSULIN 16.9 12/30/2018   Lab Results  Component Value Date   TSH 1.290 03/05/2020   Lab Results  Component Value Date   CHOL 231 (H) 03/05/2020   HDL 49 03/05/2020   LDLCALC 155 (H) 03/05/2020   TRIG 151 (H) 03/05/2020   CHOLHDL 7.3 03/07/2008   Lab Results  Component Value Date   WBC 6.0 12/30/2018   HGB 13.9 12/30/2018   HCT 41.4 12/30/2018   MCV 93 12/30/2018   PLT 161 01/01/2018   Lab Results  Component Value Date   IRON 76 07/21/2008   TIBC 295 07/21/2008   FERRITIN 219 07/21/2008   Attestation Statements:   Reviewed by clinician on day of  visit: allergies, medications, problem list, medical history, surgical history, family history, social history, and previous encounter notes.  Time spent on visit including pre-visit chart review and post-visit care and charting was 41 minutes.    I, Trixie Dredge, am acting as transcriptionist for Dennard Nip, MD.  I have reviewed the above documentation for accuracy and completeness, and I agree with the above. -  Dennard Nip, MD

## 2020-03-27 ENCOUNTER — Other Ambulatory Visit (INDEPENDENT_AMBULATORY_CARE_PROVIDER_SITE_OTHER): Payer: Self-pay | Admitting: Family Medicine

## 2020-03-27 DIAGNOSIS — E559 Vitamin D deficiency, unspecified: Secondary | ICD-10-CM

## 2020-04-02 ENCOUNTER — Ambulatory Visit (INDEPENDENT_AMBULATORY_CARE_PROVIDER_SITE_OTHER): Payer: BC Managed Care – PPO | Admitting: Family Medicine

## 2020-04-03 ENCOUNTER — Ambulatory Visit (INDEPENDENT_AMBULATORY_CARE_PROVIDER_SITE_OTHER): Payer: BC Managed Care – PPO | Admitting: Family Medicine

## 2020-04-15 ENCOUNTER — Other Ambulatory Visit (INDEPENDENT_AMBULATORY_CARE_PROVIDER_SITE_OTHER): Payer: Self-pay | Admitting: Family Medicine

## 2020-04-15 DIAGNOSIS — I10 Essential (primary) hypertension: Secondary | ICD-10-CM

## 2020-04-16 ENCOUNTER — Other Ambulatory Visit (INDEPENDENT_AMBULATORY_CARE_PROVIDER_SITE_OTHER): Payer: Self-pay | Admitting: Family Medicine

## 2020-04-16 ENCOUNTER — Encounter (INDEPENDENT_AMBULATORY_CARE_PROVIDER_SITE_OTHER): Payer: Self-pay

## 2020-04-16 DIAGNOSIS — E559 Vitamin D deficiency, unspecified: Secondary | ICD-10-CM

## 2020-04-16 DIAGNOSIS — R600 Localized edema: Secondary | ICD-10-CM

## 2020-04-19 ENCOUNTER — Other Ambulatory Visit: Payer: Self-pay

## 2020-04-19 ENCOUNTER — Ambulatory Visit (INDEPENDENT_AMBULATORY_CARE_PROVIDER_SITE_OTHER): Payer: Medicare Other | Admitting: Family Medicine

## 2020-04-19 ENCOUNTER — Encounter (INDEPENDENT_AMBULATORY_CARE_PROVIDER_SITE_OTHER): Payer: Self-pay | Admitting: Family Medicine

## 2020-04-19 VITALS — BP 130/74 | HR 64 | Temp 98.1°F | Ht 61.0 in | Wt 340.0 lb

## 2020-04-19 DIAGNOSIS — E559 Vitamin D deficiency, unspecified: Secondary | ICD-10-CM

## 2020-04-19 DIAGNOSIS — Z6841 Body Mass Index (BMI) 40.0 and over, adult: Secondary | ICD-10-CM | POA: Diagnosis not present

## 2020-04-19 DIAGNOSIS — I10 Essential (primary) hypertension: Secondary | ICD-10-CM

## 2020-04-19 MED ORDER — FUROSEMIDE 40 MG PO TABS: 20.0000 mg | ORAL_TABLET | Freq: Every day | ORAL | 0 refills | Status: DC

## 2020-04-19 MED ORDER — VITAMIN D (ERGOCALCIFEROL) 1.25 MG (50000 UNIT) PO CAPS: 50000.0000 [IU] | ORAL_CAPSULE | ORAL | 0 refills | Status: DC

## 2020-04-19 NOTE — Progress Notes (Signed)
Chief Complaint:   OBESITY Maria Stone is here to discuss her progress with her obesity treatment plan along with follow-up of her obesity related diagnoses. Maria Stone is on the Category 4 Plan and states she is following her eating plan approximately 75% of the time. Maria Stone states she is doing more walking and movement.   Today's visit was #: 21 Starting weight: 393 lbs Starting date: 12/30/2018 Today's weight: 340 lbs Today's date: 04/19/2020 Total lbs lost to date: 62 Total lbs lost since last in-office visit: 0  Interim History: Maria Stone has been taking care of her sister Butch Penny, who had major spinal surgery recently, and she often skipped meals and spent a lot of time at the hospital and taking care of her sister. She notes caregiver stress. Otherwise she loves the program and denies problems or concerns with it. She appears very knowledgeable about the plan and eating out options, etc.  Subjective:   1. Essential hypertension Maria Stone's blood pressure was elevated initially in the 161'W systolic, and upon recheck it was within normal limits.  2. Vitamin D deficiency Maria Stone is tolerating Vit D well and she denies side effects. Her level was recently approximately 50 and within normal limits.  Assessment/Plan:   1. Essential hypertension Maria Stone is working on healthy weight loss and exercise to improve blood pressure control. We will watch for signs of hypotension as she continues her lifestyle modifications. Home blood pressure monitoring was strongly encouraged with daily checks. Renal function was within normal limits approximately 1 month ago, and will continue to monitor. We will refill Lasix for 1 month.  - furosemide (LASIX) 40 MG tablet; Take 0.5 tablets (20 mg total) by mouth daily.  Dispense: 15 tablet; Refill: 0  2. Vitamin D deficiency Low Vitamin D level contributes to fatigue and are associated with obesity, breast, and colon cancer. We will refill prescription  Vitamin D for 1 month. Maria Stone will follow-up for routine testing of Vitamin D, at least 2-3 times per year to avoid over-replacement.  - Vitamin D, Ergocalciferol, (DRISDOL) 1.25 MG (50000 UNIT) CAPS capsule; Take 1 capsule (50,000 Units total) by mouth every 7 (seven) days.  Dispense: 4 capsule; Refill: 0  3. Class 3 severe obesity with serious comorbidity and body mass index (BMI) of 60.0 to 69.9 in adult, unspecified obesity type (HCC) Maria Stone is currently in the action stage of change. As such, her goal is to continue with weight loss efforts. She has agreed to the Category 4 Plan.   Exercise goals: All adults should avoid inactivity. Some physical activity is better than none, and adults who participate in any amount of physical activity gain some health benefits. She will begin walking daily.  Behavioral modification strategies: meal planning and cooking strategies and planning for success.  Maria Stone has agreed to follow-up with our clinic in 4 weeks. She was informed of the importance of frequent follow-up visits to maximize her success with intensive lifestyle modifications for her multiple health conditions.   Objective:   Blood pressure 130/74, pulse 64, temperature 98.1 F (36.7 C), temperature source Oral, height 5\' 1"  (1.549 m), weight (!) 340 lb (154.2 kg), SpO2 97 %. Body mass index is 64.24 kg/m.  General: Cooperative, alert, well developed, in no acute distress. HEENT: Conjunctivae and lids unremarkable. Cardiovascular: Regular rhythm.  Lungs: Normal work of breathing. Neurologic: No focal deficits.   Lab Results  Component Value Date   CREATININE 1.00 03/05/2020   BUN 30 (H) 03/05/2020   NA 141  03/05/2020   K 4.3 03/05/2020   CL 103 03/05/2020   CO2 24 03/05/2020   Lab Results  Component Value Date   ALT 15 03/05/2020   AST 21 03/05/2020   ALKPHOS 97 03/05/2020   BILITOT 0.5 03/05/2020   Lab Results  Component Value Date   HGBA1C 5.1 03/05/2020    HGBA1C 5.3 09/26/2019   HGBA1C 5.4 12/30/2018   HGBA1C (L) 03/08/2008    3.6 (NOTE)   The ADA recommends the following therapeutic goals for glycemic   control related to Hgb A1C measurement:   Goal of Therapy:   < 7.0% Hgb A1C   Action Suggested:  > 8.0% Hgb A1C   Ref:  Diabetes Care, 22, Suppl. 1, 1999   Lab Results  Component Value Date   INSULIN 12.3 03/05/2020   INSULIN 12.3 09/26/2019   INSULIN 16.9 12/30/2018   Lab Results  Component Value Date   TSH 1.290 03/05/2020   Lab Results  Component Value Date   CHOL 231 (H) 03/05/2020   HDL 49 03/05/2020   LDLCALC 155 (H) 03/05/2020   TRIG 151 (H) 03/05/2020   CHOLHDL 7.3 03/07/2008   Lab Results  Component Value Date   WBC 6.0 12/30/2018   HGB 13.9 12/30/2018   HCT 41.4 12/30/2018   MCV 93 12/30/2018   PLT 161 01/01/2018   Lab Results  Component Value Date   IRON 76 07/21/2008   TIBC 295 07/21/2008   FERRITIN 219 07/21/2008    Obesity Behavioral Intervention Documentation for Insurance:   Approximately 15 minutes were spent on the discussion below.  ASK: We discussed the diagnosis of obesity with Maria Stone today and Maria Stone agreed to give [name] permission to discuss obesity behavioral modification therapy today.  ASSESS: Maria Stone has the diagnosis of obesity and her BMI today is 64.28. Maria Stone is in the action stage of change.   ADVISE: Maria Stone was educated on the multiple health risks of obesity as well as the benefit of weight loss to improve her health. She was advised of the need for long term treatment and the importance of lifestyle modifications to improve her current health and to decrease her risk of future health problems.  AGREE: Multiple dietary modification options and treatment options were discussed and Maria Stone agreed to follow the recommendations documented in the above note.  ARRANGE: Maria Stone was educated on the importance of frequent visits to treat obesity as outlined per CMS and USPSTF  guidelines and agreed to schedule her next follow up appointment today.  Attestation Statements:   Reviewed by clinician on day of visit: allergies, medications, problem list, medical history, surgical history, family history, social history, and previous encounter notes.   I, Burt Knack, am acting as transcriptionist for Quillian Quince, MD.  I have reviewed the above documentation for accuracy and completeness, and I agree with the above. -  Quillian Quince, MD

## 2020-05-10 ENCOUNTER — Other Ambulatory Visit (INDEPENDENT_AMBULATORY_CARE_PROVIDER_SITE_OTHER): Payer: Self-pay | Admitting: Family Medicine

## 2020-05-10 DIAGNOSIS — E559 Vitamin D deficiency, unspecified: Secondary | ICD-10-CM

## 2020-05-18 ENCOUNTER — Other Ambulatory Visit (INDEPENDENT_AMBULATORY_CARE_PROVIDER_SITE_OTHER): Payer: Self-pay | Admitting: Family Medicine

## 2020-05-18 DIAGNOSIS — I10 Essential (primary) hypertension: Secondary | ICD-10-CM

## 2020-05-21 ENCOUNTER — Ambulatory Visit (INDEPENDENT_AMBULATORY_CARE_PROVIDER_SITE_OTHER): Payer: Medicare Other | Admitting: Family Medicine

## 2020-05-21 ENCOUNTER — Other Ambulatory Visit: Payer: Self-pay

## 2020-05-21 ENCOUNTER — Encounter (INDEPENDENT_AMBULATORY_CARE_PROVIDER_SITE_OTHER): Payer: Self-pay | Admitting: Family Medicine

## 2020-05-21 VITALS — BP 140/70 | HR 66 | Temp 98.7°F | Ht 61.0 in | Wt 342.0 lb

## 2020-05-21 DIAGNOSIS — Z6841 Body Mass Index (BMI) 40.0 and over, adult: Secondary | ICD-10-CM | POA: Diagnosis not present

## 2020-05-21 DIAGNOSIS — E559 Vitamin D deficiency, unspecified: Secondary | ICD-10-CM | POA: Diagnosis not present

## 2020-05-21 DIAGNOSIS — R600 Localized edema: Secondary | ICD-10-CM | POA: Diagnosis not present

## 2020-05-21 MED ORDER — VITAMIN D (ERGOCALCIFEROL) 1.25 MG (50000 UNIT) PO CAPS: 50000.0000 [IU] | ORAL_CAPSULE | ORAL | 0 refills | Status: DC

## 2020-05-21 MED ORDER — FUROSEMIDE 40 MG PO TABS: 20.0000 mg | ORAL_TABLET | Freq: Every day | ORAL | 0 refills | Status: DC

## 2020-05-23 NOTE — Progress Notes (Signed)
Chief Complaint:   OBESITY Wing is here to discuss her progress with her obesity treatment plan along with follow-up of her obesity related diagnoses. Genavive is on the Category 4 Plan and states she is following her eating plan approximately 80-85% of the time. Sydne states she is doing resistance bands for 15 minutes 3 times per week.  Today's visit was #: 22 Starting weight: 393 lbs Starting date: 12/30/2018 Today's weight: 342 lbs Today's date: 05/21/2020 Total lbs lost to date: 51 Total lbs lost since last in-office visit: 0  Interim History: Shakila did some celebration eating for her birthday, and she is retaining some water weight. She has gotten back on track and her goal is to lose approximately 8 lbs before the end of the Summer.  Subjective:   1. Vitamin D deficiency Kenlei is stable on Vit D, and she denies nausea, vomiting, or muscle weakness.  2. Bilateral lower extremity edema Chelcey is stable on Lasix, and her last CMP was within normal limits.  Assessment/Plan:   1. Vitamin D deficiency Low Vitamin D level contributes to fatigue and are associated with obesity, breast, and colon cancer. We will refill prescription Vitamin D for 90 days with no refills. Shenelle will follow-up for routine testing of Vitamin D, at least 2-3 times per year to avoid over-replacement.  - Vitamin D, Ergocalciferol, (DRISDOL) 1.25 MG (50000 UNIT) CAPS capsule; Take 1 capsule (50,000 Units total) by mouth every 7 (seven) days.  Dispense: 12 capsule; Refill: 0  2. Bilateral lower extremity edema We will refill Lasix for 90 days with no refills. Estreya will follow up as directed.  - furosemide (LASIX) 40 MG tablet; Take 0.5 tablets (20 mg total) by mouth daily.  Dispense: 45 tablet; Refill: 0  3. Class 3 severe obesity with serious comorbidity and body mass index (BMI) of 60.0 to 69.9 in adult, unspecified obesity type (HCC) Micha is currently in the action stage of  change. As such, her goal is to continue with weight loss efforts. She has agreed to the Category 4 Plan.   Exercise goals: Darrelyn is to do more exercise bands exercises. Exercises given today.  Behavioral modification strategies: meal planning and cooking strategies.  Elfreida has agreed to follow-up with our clinic in 2 to 3 weeks. She was informed of the importance of frequent follow-up visits to maximize her success with intensive lifestyle modifications for her multiple health conditions.   Objective:   Blood pressure 140/70, pulse 66, temperature 98.7 F (37.1 C), temperature source Oral, height 5\' 1"  (1.549 m), weight (!) 342 lb (155.1 kg), SpO2 98 %. Body mass index is 64.62 kg/m.  General: Cooperative, alert, well developed, in no acute distress. HEENT: Conjunctivae and lids unremarkable. Cardiovascular: Regular rhythm.  Lungs: Normal work of breathing. Neurologic: No focal deficits.   Lab Results  Component Value Date   CREATININE 1.00 03/05/2020   BUN 30 (H) 03/05/2020   NA 141 03/05/2020   K 4.3 03/05/2020   CL 103 03/05/2020   CO2 24 03/05/2020   Lab Results  Component Value Date   ALT 15 03/05/2020   AST 21 03/05/2020   ALKPHOS 97 03/05/2020   BILITOT 0.5 03/05/2020   Lab Results  Component Value Date   HGBA1C 5.1 03/05/2020   HGBA1C 5.3 09/26/2019   HGBA1C 5.4 12/30/2018   HGBA1C (L) 03/08/2008    3.6 (NOTE)   The ADA recommends the following therapeutic goals for glycemic   control related to  Hgb A1C measurement:   Goal of Therapy:   < 7.0% Hgb A1C   Action Suggested:  > 8.0% Hgb A1C   Ref:  Diabetes Care, 22, Suppl. 1, 1999   Lab Results  Component Value Date   INSULIN 12.3 03/05/2020   INSULIN 12.3 09/26/2019   INSULIN 16.9 12/30/2018   Lab Results  Component Value Date   TSH 1.290 03/05/2020   Lab Results  Component Value Date   CHOL 231 (H) 03/05/2020   HDL 49 03/05/2020   LDLCALC 155 (H) 03/05/2020   TRIG 151 (H) 03/05/2020    CHOLHDL 7.3 03/07/2008   Lab Results  Component Value Date   WBC 6.0 12/30/2018   HGB 13.9 12/30/2018   HCT 41.4 12/30/2018   MCV 93 12/30/2018   PLT 161 01/01/2018   Lab Results  Component Value Date   IRON 76 07/21/2008   TIBC 295 07/21/2008   FERRITIN 219 07/21/2008    Obesity Behavioral Intervention Documentation for Insurance:   Approximately 15 minutes were spent on the discussion below.  ASK: We discussed the diagnosis of obesity with [name] today and Arlenne agreed to give [name] permission to discuss obesity behavioral modification therapy today.  ASSESS: Wen has the diagnosis of obesity and her BMI today is 64.65. Aleksia is in the action stage of change.   ADVISE: Donika was educated on the multiple health risks of obesity as well as the benefit of weight loss to improve her health. She was advised of the need for long term treatment and the importance of lifestyle modifications to improve her current health and to decrease her risk of future health problems.  AGREE: Multiple dietary modification options and treatment options were discussed and Luellen agreed to follow the recommendations documented in the above note.  ARRANGE: Ceana was educated on the importance of frequent visits to treat obesity as outlined per CMS and USPSTF guidelines and agreed to schedule her next follow up appointment today.  Attestation Statements:   Reviewed by clinician on day of visit: allergies, medications, problem list, medical history, surgical history, family history, social history, and previous encounter notes.   I, Burt Knack, am acting as transcriptionist for Quillian Quince, MD.  I have reviewed the above documentation for accuracy and completeness, and I agree with the above. -  Quillian Quince, MD

## 2020-06-06 ENCOUNTER — Other Ambulatory Visit (INDEPENDENT_AMBULATORY_CARE_PROVIDER_SITE_OTHER): Payer: Self-pay | Admitting: Family Medicine

## 2020-06-06 DIAGNOSIS — E559 Vitamin D deficiency, unspecified: Secondary | ICD-10-CM

## 2020-06-13 ENCOUNTER — Other Ambulatory Visit: Payer: Self-pay

## 2020-06-13 ENCOUNTER — Ambulatory Visit (INDEPENDENT_AMBULATORY_CARE_PROVIDER_SITE_OTHER): Payer: Medicare Other | Admitting: Physician Assistant

## 2020-06-13 ENCOUNTER — Encounter (INDEPENDENT_AMBULATORY_CARE_PROVIDER_SITE_OTHER): Payer: Self-pay | Admitting: Physician Assistant

## 2020-06-13 VITALS — BP 142/83 | HR 72 | Temp 98.3°F | Ht 61.0 in | Wt 338.0 lb

## 2020-06-13 DIAGNOSIS — E559 Vitamin D deficiency, unspecified: Secondary | ICD-10-CM | POA: Diagnosis not present

## 2020-06-13 DIAGNOSIS — Z6841 Body Mass Index (BMI) 40.0 and over, adult: Secondary | ICD-10-CM | POA: Diagnosis not present

## 2020-06-13 DIAGNOSIS — I10 Essential (primary) hypertension: Secondary | ICD-10-CM

## 2020-06-13 NOTE — Progress Notes (Signed)
Chief Complaint:   Maria Stone is here to discuss her progress with her Maria treatment plan along with follow-up of her Maria related diagnoses. Maria Stone is on the Category 4 Plan and states she is following her eating plan approximately 95% of the time. Maria Stone states she is walking/resistance bands 15 minutes 3 times per week.  Today's visit was #: 24 Starting weight: 393 lbs Starting date: 12/30/2018 Today's weight: 338 lbs Today's date: 06/13/2020 Total lbs lost to date: 55 Total lbs lost since last in-office visit: 4  Interim History: Maria Stone states that she was able to get back on track over the last few weeks. She is asking about substitutions for breakfast and additional snack options.  Subjective:   Vitamin D deficiency. Maria Stone is on Vitamin D supplementation. No nausea, vomiting, or muscle weakness.    Ref. Range 03/05/2020 11:39  Vitamin D, 25-Hydroxy Latest Ref Range: 30.0 - 100.0 ng/mL 47.0   Essential hypertension. No  chest pain or headache. Maria Stone is on lisinopril and Lasix.  BP Readings from Last 3 Encounters:  06/13/20 (!) 142/83  05/21/20 140/70  04/19/20 130/74   Lab Results  Component Value Date   CREATININE 1.00 03/05/2020   CREATININE 1.01 (H) 09/26/2019   CREATININE 1.14 (H) 12/30/2018   Assessment/Plan:   Vitamin D deficiency. Low Vitamin D level contributes to fatigue and are associated with Maria, breast, and colon cancer. She agrees to continue to take Vitamin D as directed and will follow-up for routine testing of Vitamin D, at least 2-3 times per year to avoid over-replacement.  Essential hypertension. Maria Stone is working on healthy weight loss and exercise to improve blood pressure control. We will watch for signs of hypotension as she continues her lifestyle modifications. She will continue her medications as directed.   Class 3 severe Maria with serious comorbidity and body mass index (BMI) of 60.0 to 69.9 in  adult, unspecified Maria type (HCC).  Maria Stone is currently in the action stage of change. As such, her goal is to continue with weight loss efforts. She has agreed to the Category 4 Plan.   Exercise goals: Older adults should follow the adult guidelines. When older adults cannot meet the adult guidelines, they should be as physically active as their abilities and conditions will allow.   Behavioral modification strategies: meal planning and cooking strategies and keeping healthy foods in the home.  Maria Stone has agreed to follow-up with our clinic in 3-4 weeks. She was informed of the importance of frequent follow-up visits to maximize her success with intensive lifestyle modifications for her multiple health conditions.   Objective:   Blood pressure (!) 142/83, pulse 72, temperature 98.3 F (36.8 C), temperature source Oral, height 5\' 1"  (1.549 m), weight (!) 338 lb (153.3 kg), SpO2 96 %. Body mass index is 63.86 kg/m.  General: Cooperative, alert, well developed, in no acute distress. HEENT: Conjunctivae and lids unremarkable. Cardiovascular: Regular rhythm.  Lungs: Normal work of breathing. Neurologic: No focal deficits.   Lab Results  Component Value Date   CREATININE 1.00 03/05/2020   BUN 30 (H) 03/05/2020   NA 141 03/05/2020   K 4.3 03/05/2020   CL 103 03/05/2020   CO2 24 03/05/2020   Lab Results  Component Value Date   ALT 15 03/05/2020   AST 21 03/05/2020   ALKPHOS 97 03/05/2020   BILITOT 0.5 03/05/2020   Lab Results  Component Value Date   HGBA1C 5.1 03/05/2020   HGBA1C 5.3  09/26/2019   HGBA1C 5.4 12/30/2018   HGBA1C (L) 03/08/2008    3.6 (NOTE)   The ADA recommends the following therapeutic goals for glycemic   control related to Hgb A1C measurement:   Goal of Therapy:   < 7.0% Hgb A1C   Action Suggested:  > 8.0% Hgb A1C   Ref:  Diabetes Care, 22, Suppl. 1, 1999   Lab Results  Component Value Date   INSULIN 12.3 03/05/2020   INSULIN 12.3 09/26/2019    INSULIN 16.9 12/30/2018   Lab Results  Component Value Date   TSH 1.290 03/05/2020   Lab Results  Component Value Date   CHOL 231 (H) 03/05/2020   HDL 49 03/05/2020   LDLCALC 155 (H) 03/05/2020   TRIG 151 (H) 03/05/2020   CHOLHDL 7.3 03/07/2008   Lab Results  Component Value Date   WBC 6.0 12/30/2018   HGB 13.9 12/30/2018   HCT 41.4 12/30/2018   MCV 93 12/30/2018   PLT 161 01/01/2018   Lab Results  Component Value Date   IRON 76 07/21/2008   TIBC 295 07/21/2008   FERRITIN 219 07/21/2008   Maria Behavioral Intervention Documentation for Insurance:   Approximately 15 minutes were spent on the discussion below.  ASK: We discussed the diagnosis of Maria with Maria Stone today and Maria Stone agreed to give [name] permission to discuss Maria behavioral modification therapy today.  ASSESS: Maria Stone has the diagnosis of Maria and her BMI today is 63.9. Maria Stone is in the action stage of change.   ADVISE: Maria Stone was educated on the multiple health risks of Maria as well as the benefit of weight loss to improve her health. She was advised of the need for long term treatment and the importance of lifestyle modifications to improve her current health and to decrease her risk of future health problems.  AGREE: Multiple dietary modification options and treatment options were discussed and Maria Stone agreed to follow the recommendations documented in the above note.  ARRANGE: Maria Stone was educated on the importance of frequent visits to treat Maria as outlined per CMS and USPSTF guidelines and agreed to schedule her next follow up appointment today.  Attestation Statements:   Reviewed by clinician on day of visit: allergies, medications, problem list, medical history, surgical history, family history, social history, and previous encounter notes.  IMarianna Payment, am acting as transcriptionist for Alois Cliche, PA-C   I have reviewed the above documentation for accuracy and  completeness, and I agree with the above. Alois Cliche, PA-C

## 2020-06-25 ENCOUNTER — Other Ambulatory Visit (INDEPENDENT_AMBULATORY_CARE_PROVIDER_SITE_OTHER): Payer: Self-pay | Admitting: Family Medicine

## 2020-06-25 DIAGNOSIS — E559 Vitamin D deficiency, unspecified: Secondary | ICD-10-CM

## 2020-07-11 ENCOUNTER — Encounter (INDEPENDENT_AMBULATORY_CARE_PROVIDER_SITE_OTHER): Payer: Self-pay | Admitting: Family Medicine

## 2020-07-11 ENCOUNTER — Ambulatory Visit (INDEPENDENT_AMBULATORY_CARE_PROVIDER_SITE_OTHER): Payer: Medicare Other | Admitting: Family Medicine

## 2020-07-11 ENCOUNTER — Other Ambulatory Visit: Payer: Self-pay

## 2020-07-11 VITALS — BP 136/75 | HR 73 | Temp 97.9°F | Ht 61.0 in | Wt 340.0 lb

## 2020-07-11 DIAGNOSIS — R600 Localized edema: Secondary | ICD-10-CM

## 2020-07-11 DIAGNOSIS — Z6841 Body Mass Index (BMI) 40.0 and over, adult: Secondary | ICD-10-CM

## 2020-07-11 DIAGNOSIS — E559 Vitamin D deficiency, unspecified: Secondary | ICD-10-CM | POA: Diagnosis not present

## 2020-07-11 DIAGNOSIS — I10 Essential (primary) hypertension: Secondary | ICD-10-CM | POA: Diagnosis not present

## 2020-07-11 MED ORDER — FUROSEMIDE 40 MG PO TABS: 20.0000 mg | ORAL_TABLET | Freq: Every day | ORAL | 0 refills | Status: DC

## 2020-07-11 MED ORDER — VITAMIN D (ERGOCALCIFEROL) 1.25 MG (50000 UNIT) PO CAPS: 50000.0000 [IU] | ORAL_CAPSULE | ORAL | 0 refills | Status: DC

## 2020-07-11 MED ORDER — LISINOPRIL 10 MG PO TABS: 10.0000 mg | ORAL_TABLET | Freq: Every day | ORAL | 0 refills | Status: DC

## 2020-07-11 NOTE — Progress Notes (Signed)
Chief Complaint:   OBESITY Maria Stone Maria Stone here to discuss her progress with her obesity treatment plan along with follow-up of her obesity related diagnoses. Maria Stone Maria Stone on the Category 4 Plan and states she Maria Stone following her eating plan approximately 95% of the time. Maria Stone states she Maria Stone using exercise bands for 15 minutes 3 times per week.  Today's visit was #: 25 Starting weight: 393 lbs Starting date: 12/30/2018 Today's weight: 340 lbs Today's date: 07/11/2020 Total lbs lost to date: 53 Total lbs lost since last in-office visit: 0  Interim History: Maria Stone Maria Stone retaining some water weight today, but she Maria Stone wearing smaller sizes and her fat % has decreased. She Maria Stone following her plan well overall.  Subjective:   1. Essential hypertension Maria Stone's blood pressure Maria Stone stable on her medications, and she denies chest pain or headache. She requests a refill today.  2. Bilateral lower extremity edema Maria Stone Maria Stone stable on Lasix, and her last CMP was within normal limits.  3. Vitamin D deficiency Maria Stone Maria Stone stable on Vit D, and she denies nausea or vomiting.  Assessment/Plan:   1. Essential hypertension Maria Stone will continue working on healthy weight loss, diet, and exercise to improve blood pressure control. We will watch for signs of hypotension Maria Stone she continues her lifestyle modifications. We will refill lisinopril for 1 month.  - lisinopril (ZESTRIL) 10 MG tablet; Take 1 tablet (10 mg total) by mouth daily.  Dispense: 30 tablet; Refill: 0  2. Bilateral lower extremity edema We will refill Lasix for 1 month. Maria Stone will continue with weight loss and will monitor closely.  - furosemide (LASIX) 40 MG tablet; Take 0.5 tablets (20 mg total) by mouth daily.  Dispense: 45 tablet; Refill: 0  3. Vitamin D deficiency Low Vitamin D level contributes to fatigue and are associated with obesity, breast, and colon cancer. We will refill prescription Vitamin D for 1 month. Maria Stone will  follow-up for routine testing of Vitamin D, at least 2-3 times per year to avoid over-replacement.  - Vitamin D, Ergocalciferol, (DRISDOL) 1.25 MG (50000 UNIT) CAPS capsule; Take 1 capsule (50,000 Units total) by mouth every 7 (seven) days.  Dispense: 4 capsule; Refill: 0  4. Class 3 severe obesity with serious comorbidity and body mass index (BMI) of 60.0 to 69.9 in adult, unspecified obesity type (HCC) Maria Stone Maria Stone currently in the action stage of change. Maria Stone such, her goal Maria Stone to continue with weight loss efforts. She has agreed to the Category 4 Plan.   Exercise goals: Maria Stone. Maria Stone.  Behavioral modification strategies: increasing lean protein intake.  Maria Stone has agreed to follow-up with our clinic in 3 weeks. She was informed of the importance of frequent follow-up visits to maximize her success with intensive lifestyle modifications for her multiple health conditions.   Objective:   Blood pressure 136/75, pulse 73, temperature 97.9 F (36.6 C), height 5\' 1"  (1.549 m), weight (!) 340 lb (154.2 kg), SpO2 95 %. Body mass index Maria Stone 64.24 kg/m.  General: Cooperative, alert, well developed, in no acute distress. HEENT: Conjunctivae and lids unremarkable. Cardiovascular: Regular rhythm.  Lungs: Normal work of breathing. Neurologic: No focal deficits.   Lab Results  Component Value Date   CREATININE 1.00 03/05/2020   BUN 30 (H) 03/05/2020   NA 141 03/05/2020   K 4.3 03/05/2020   CL 103 03/05/2020   CO2 24 03/05/2020   Lab Results  Component Value Date   ALT 15 03/05/2020   AST 21 03/05/2020  ALKPHOS 97 03/05/2020   BILITOT 0.5 03/05/2020   Lab Results  Component Value Date   HGBA1C 5.1 03/05/2020   HGBA1C 5.3 09/26/2019   HGBA1C 5.4 12/30/2018   HGBA1C (L) 03/08/2008    3.6 (NOTE)   The ADA recommends the following therapeutic goals for glycemic   control related to Hgb A1C measurement:   Goal of Therapy:   < 7.0% Hgb A1C   Action Suggested:  > 8.0% Hgb A1C   Ref:  Diabetes  Care, 22, Suppl. 1, 1999   Lab Results  Component Value Date   INSULIN 12.3 03/05/2020   INSULIN 12.3 09/26/2019   INSULIN 16.9 12/30/2018   Lab Results  Component Value Date   TSH 1.290 03/05/2020   Lab Results  Component Value Date   CHOL 231 (H) 03/05/2020   HDL 49 03/05/2020   LDLCALC 155 (H) 03/05/2020   TRIG 151 (H) 03/05/2020   CHOLHDL 7.3 03/07/2008   Lab Results  Component Value Date   WBC 6.0 12/30/2018   HGB 13.9 12/30/2018   HCT 41.4 12/30/2018   MCV 93 12/30/2018   PLT 161 01/01/2018   Lab Results  Component Value Date   IRON 76 07/21/2008   TIBC 295 07/21/2008   FERRITIN 219 07/21/2008    Obesity Behavioral Intervention Documentation for Insurance:   Approximately 15 minutes were spent on the discussion below.  ASK: We discussed the diagnosis of obesity with Maria Stone today and Maria Stone agreed to give [name] permission to discuss obesity behavioral modification therapy today.  ASSESS: Maria Stone has the diagnosis of obesity and her BMI today Maria Stone 64.28. Maria Stone Maria Stone in the action stage of change.   ADVISE: Maria Stone was educated on the multiple health risks of obesity Maria Stone well Maria Stone the benefit of weight loss to improve her health. She was advised of the need for long term treatment and the importance of lifestyle modifications to improve her current health and to decrease her risk of future health problems.  AGREE: Multiple dietary modification options and treatment options were discussed and Maria Stone agreed to follow the recommendations documented in the above note.  ARRANGE: Maria Stone was educated on the importance of frequent visits to treat obesity Maria Stone outlined per CMS and USPSTF guidelines and agreed to schedule her next follow up appointment today.  Attestation Statements:   Reviewed by clinician on day of visit: allergies, medications, problem list, medical history, surgical history, family history, social history, and previous encounter notes.   I,  Burt Knack, am acting Maria Stone transcriptionist for Quillian Quince, MD.  I have reviewed the above documentation for accuracy and completeness, and I agree with the above. -  Quillian Quince, MD

## 2020-07-18 ENCOUNTER — Other Ambulatory Visit (INDEPENDENT_AMBULATORY_CARE_PROVIDER_SITE_OTHER): Payer: Self-pay | Admitting: Family Medicine

## 2020-07-18 DIAGNOSIS — E559 Vitamin D deficiency, unspecified: Secondary | ICD-10-CM

## 2020-07-31 ENCOUNTER — Other Ambulatory Visit (INDEPENDENT_AMBULATORY_CARE_PROVIDER_SITE_OTHER): Payer: Self-pay | Admitting: Family Medicine

## 2020-07-31 DIAGNOSIS — E559 Vitamin D deficiency, unspecified: Secondary | ICD-10-CM

## 2020-08-01 ENCOUNTER — Ambulatory Visit (INDEPENDENT_AMBULATORY_CARE_PROVIDER_SITE_OTHER): Payer: Medicare Other | Admitting: Family Medicine

## 2020-08-01 ENCOUNTER — Other Ambulatory Visit: Payer: Self-pay

## 2020-08-01 VITALS — BP 107/70 | HR 97 | Temp 99.6°F | Ht 61.0 in | Wt 338.0 lb

## 2020-08-01 DIAGNOSIS — E7849 Other hyperlipidemia: Secondary | ICD-10-CM | POA: Diagnosis not present

## 2020-08-01 DIAGNOSIS — Z6841 Body Mass Index (BMI) 40.0 and over, adult: Secondary | ICD-10-CM

## 2020-08-02 NOTE — Progress Notes (Signed)
Chief Complaint:   OBESITY Maria Stone is here to discuss her progress with her obesity treatment plan along with follow-up of her obesity related diagnoses. Maria Stone is on the Category 4 Plan and states she is following her eating plan approximately 95% of the time. Maria Stone states she is walking and doing chair exercises for 15 minutes 2 times per week.  Today's visit was #: 26 Starting weight: 393 lbs Starting date: 12/30/2018 Today's weight: 338 lbs Today's date: 08/01/2020 Total lbs lost to date: 55 Total lbs lost since last in-office visit: 2  Interim History: Maria Stone continues to do well with weight loss on her eating plan. Her hunger is controlled and she is doing well with meal planning. She hasn't exercised as much since her last visit.  Subjective:   1. Other hyperlipidemia Maria Stone is working on diet and weight loss. Her last LDL was still above goal. She is due to have labs done soon.  Assessment/Plan:   1. Other hyperlipidemia Cardiovascular risk and specific lipid/LDL goals reviewed. We discussed several lifestyle modifications today. Maria Stone will continue to work on diet, exercise and weight loss efforts. We will recheck fasting labs at her next visit. Orders and follow up as documented in patient record.   2. Class 3 severe obesity with serious comorbidity and body mass index (BMI) of 60.0 to 69.9 in adult, unspecified obesity type (HCC) Maria Stone is currently in the action stage of change. As such, her goal is to continue with weight loss efforts. She has agreed to the Category 4 Plan.   Exercise goals: All adults should avoid inactivity. Some physical activity is better than none, and adults who participate in any amount of physical activity gain some health benefits.  Behavioral modification strategies: increasing lean protein intake.  Maria Stone has agreed to follow-up with our clinic in 3 weeks. She was informed of the importance of frequent follow-up visits to  maximize her success with intensive lifestyle modifications for her multiple health conditions.   Objective:   Blood pressure 107/70, pulse 97, temperature 99.6 F (37.6 C), height 5\' 1"  (1.549 m), weight (!) 338 lb (153.3 kg), SpO2 97 %. Body mass index is 63.86 kg/m.  General: Cooperative, alert, well developed, in no acute distress. HEENT: Conjunctivae and lids unremarkable. Cardiovascular: Regular rhythm.  Lungs: Normal work of breathing. Neurologic: No focal deficits.   Lab Results  Component Value Date   CREATININE 1.00 03/05/2020   BUN 30 (H) 03/05/2020   NA 141 03/05/2020   K 4.3 03/05/2020   CL 103 03/05/2020   CO2 24 03/05/2020   Lab Results  Component Value Date   ALT 15 03/05/2020   AST 21 03/05/2020   ALKPHOS 97 03/05/2020   BILITOT 0.5 03/05/2020   Lab Results  Component Value Date   HGBA1C 5.1 03/05/2020   HGBA1C 5.3 09/26/2019   HGBA1C 5.4 12/30/2018   HGBA1C (L) 03/08/2008    3.6 (NOTE)   The ADA recommends the following therapeutic goals for glycemic   control related to Hgb A1C measurement:   Goal of Therapy:   < 7.0% Hgb A1C   Action Suggested:  > 8.0% Hgb A1C   Ref:  Diabetes Care, 22, Suppl. 1, 1999   Lab Results  Component Value Date   INSULIN 12.3 03/05/2020   INSULIN 12.3 09/26/2019   INSULIN 16.9 12/30/2018   Lab Results  Component Value Date   TSH 1.290 03/05/2020   Lab Results  Component Value Date   CHOL  231 (H) 03/05/2020   HDL 49 03/05/2020   LDLCALC 155 (H) 03/05/2020   TRIG 151 (H) 03/05/2020   CHOLHDL 7.3 03/07/2008   Lab Results  Component Value Date   WBC 6.0 12/30/2018   HGB 13.9 12/30/2018   HCT 41.4 12/30/2018   MCV 93 12/30/2018   PLT 161 01/01/2018   Lab Results  Component Value Date   IRON 76 07/21/2008   TIBC 295 07/21/2008   FERRITIN 219 07/21/2008   Attestation Statements:   Reviewed by clinician on day of visit: allergies, medications, problem list, medical history, surgical history, family  history, social history, and previous encounter notes.  Time spent on visit including pre-visit chart review and post-visit care and charting was 22 minutes.    I, Burt Knack, am acting as transcriptionist for Quillian Quince, MD.  I have reviewed the above documentation for accuracy and completeness, and I agree with the above. -  Quillian Quince, MD

## 2020-08-13 ENCOUNTER — Other Ambulatory Visit (INDEPENDENT_AMBULATORY_CARE_PROVIDER_SITE_OTHER): Payer: Self-pay | Admitting: Family Medicine

## 2020-08-13 DIAGNOSIS — E559 Vitamin D deficiency, unspecified: Secondary | ICD-10-CM

## 2020-08-14 NOTE — Telephone Encounter (Signed)
Refill request form filled out and given to MD for approval/denial. °

## 2020-08-27 ENCOUNTER — Encounter (INDEPENDENT_AMBULATORY_CARE_PROVIDER_SITE_OTHER): Payer: Self-pay | Admitting: Family Medicine

## 2020-08-27 ENCOUNTER — Ambulatory Visit (INDEPENDENT_AMBULATORY_CARE_PROVIDER_SITE_OTHER): Payer: Medicare Other | Admitting: Family Medicine

## 2020-08-27 ENCOUNTER — Other Ambulatory Visit: Payer: Self-pay

## 2020-08-27 VITALS — BP 123/80 | HR 71 | Temp 98.0°F | Ht 61.0 in | Wt 338.0 lb

## 2020-08-27 DIAGNOSIS — I10 Essential (primary) hypertension: Secondary | ICD-10-CM

## 2020-08-27 DIAGNOSIS — Z6841 Body Mass Index (BMI) 40.0 and over, adult: Secondary | ICD-10-CM

## 2020-08-27 DIAGNOSIS — R739 Hyperglycemia, unspecified: Secondary | ICD-10-CM | POA: Diagnosis not present

## 2020-08-27 DIAGNOSIS — E559 Vitamin D deficiency, unspecified: Secondary | ICD-10-CM | POA: Diagnosis not present

## 2020-08-27 DIAGNOSIS — E7849 Other hyperlipidemia: Secondary | ICD-10-CM

## 2020-08-28 LAB — LIPID PANEL WITH LDL/HDL RATIO
Cholesterol, Total: 246 mg/dL — ABNORMAL HIGH (ref 100–199)
HDL: 44 mg/dL (ref >39)
LDL Chol Calc (NIH): 177 mg/dL — ABNORMAL HIGH (ref 0–99)
LDL/HDL Ratio: 4 ratio — ABNORMAL HIGH (ref 0.0–3.2)
Triglycerides: 137 mg/dL (ref 0–149)
VLDL Cholesterol Cal: 25 mg/dL (ref 5–40)

## 2020-08-28 LAB — CBC WITH DIFFERENTIAL/PLATELET
Basophils Absolute: 0.1 10*3/uL (ref 0.0–0.2)
Basos: 1 %
EOS (ABSOLUTE): 0.3 10*3/uL (ref 0.0–0.4)
Eos: 4 %
Hematocrit: 42.2 % (ref 34.0–46.6)
Hemoglobin: 14.1 g/dL (ref 11.1–15.9)
Immature Grans (Abs): 0 10*3/uL (ref 0.0–0.1)
Immature Granulocytes: 0 %
Lymphocytes Absolute: 1.2 10*3/uL (ref 0.7–3.1)
Lymphs: 19 %
MCH: 31.6 pg (ref 26.6–33.0)
MCHC: 33.4 g/dL (ref 31.5–35.7)
MCV: 95 fL (ref 79–97)
Monocytes Absolute: 0.5 10*3/uL (ref 0.1–0.9)
Monocytes: 8 %
Neutrophils Absolute: 4.2 10*3/uL (ref 1.4–7.0)
Neutrophils: 68 %
Platelets: 198 10*3/uL (ref 150–450)
RBC: 4.46 x10E6/uL (ref 3.77–5.28)
RDW: 11.6 % — ABNORMAL LOW (ref 11.7–15.4)
WBC: 6.3 10*3/uL (ref 3.4–10.8)

## 2020-08-28 LAB — COMPREHENSIVE METABOLIC PANEL
ALT: 17 IU/L (ref 0–32)
AST: 18 IU/L (ref 0–40)
Albumin/Globulin Ratio: 1.8 (ref 1.2–2.2)
Albumin: 4.1 g/dL (ref 3.8–4.8)
Alkaline Phosphatase: 92 IU/L (ref 44–121)
BUN/Creatinine Ratio: 32 — ABNORMAL HIGH (ref 12–28)
BUN: 35 mg/dL — ABNORMAL HIGH (ref 8–27)
Bilirubin Total: 0.6 mg/dL (ref 0.0–1.2)
CO2: 25 mmol/L (ref 20–29)
Calcium: 9.6 mg/dL (ref 8.7–10.3)
Chloride: 100 mmol/L (ref 96–106)
Creatinine, Ser: 1.11 mg/dL — ABNORMAL HIGH (ref 0.57–1.00)
GFR calc Af Amer: 60 mL/min/{1.73_m2} (ref >59)
GFR calc non Af Amer: 52 mL/min/{1.73_m2} — ABNORMAL LOW (ref >59)
Globulin, Total: 2.3 g/dL (ref 1.5–4.5)
Glucose: 90 mg/dL (ref 65–99)
Potassium: 5.5 mmol/L — ABNORMAL HIGH (ref 3.5–5.2)
Sodium: 140 mmol/L (ref 134–144)
Total Protein: 6.4 g/dL (ref 6.0–8.5)

## 2020-08-28 LAB — HEMOGLOBIN A1C
Est. average glucose Bld gHb Est-mCnc: 108 mg/dL
Hgb A1c MFr Bld: 5.4 % (ref 4.8–5.6)

## 2020-08-28 LAB — INSULIN, RANDOM: INSULIN: 17.1 u[IU]/mL (ref 2.6–24.9)

## 2020-08-28 LAB — VITAMIN D 25 HYDROXY (VIT D DEFICIENCY, FRACTURES): Vit D, 25-Hydroxy: 49.1 ng/mL (ref 30.0–100.0)

## 2020-09-03 NOTE — Progress Notes (Signed)
Chief Complaint:   OBESITY Maria Stone is here to discuss her progress with her obesity treatment plan along with follow-up of her obesity related diagnoses. Maria Stone is on the Category 4 Plan and states she is following her eating plan approximately 96.5% of the time. Maria Stone states she is walking for 15 minutes 3 times per week.  Today's visit was #: 27 Starting weight: 393 lbs Starting date: 12/30/2018 Today's weight: 338 lbs Today's date: 08/27/2020 Total lbs lost to date: 55 Total lbs lost since last in-office visit: 0  Interim History: Maria Stone has done well maintaining her weight loss. She has done extra well with her eating and she feels lighter and clothes are looser. She is dealing with an abscess and this could be causing some increased fluid which may be why the scale didn't decrease.  Subjective:   1. Hyperglycemia Maria Stone is due for labs, and she is doing well with diet and weight loss. She denies hypoglycemia.  2. Vitamin D deficiency Maria Stone is on Vit D, and she is due for labs. She denies nausea, vomiting, or muscle weakness.  3. Other hyperlipidemia Maria Stone is working on diet and weight loss to control her cholesterol. She denies chest pain.  4. Essential hypertension Maria Stone's blood pressure is on Zestril, and she denies chest pain or lightheadedness.  Assessment/Plan:   1. Hyperglycemia Fasting labs will be obtained today and results with be discussed with Maria Stone in 2 weeks at her follow up visit. In the meanwhile Maria Stone will continue diet, exercise, and will work on weight loss efforts.  - Hemoglobin A1c - Insulin, random  2. Vitamin D deficiency Low Vitamin D level contributes to fatigue and are associated with obesity, breast, and colon cancer. We will check labs today, and Maria Stone will follow-up for routine testing of Vitamin D, at least 2-3 times per year to avoid over-replacement.  - VITAMIN D 25 Hydroxy (Vit-D Deficiency,  Fractures)  3. Other hyperlipidemia Cardiovascular risk and specific lipid/LDL goals reviewed. We discussed several lifestyle modifications today. Maria Stone will continue to work on diet, exercise and weight loss efforts. We will check labs today. Orders and follow up as documented in patient record.   - CBC with Differential/Platelet - Hemoglobin A1c - Lipid Panel With LDL/HDL Ratio  4. Essential hypertension Maria Stone is working on healthy weight loss, diet, and exercise to improve blood pressure control. We will watch for signs of hypotension as she continues her lifestyle modifications. We will check labs today.  - Comprehensive metabolic panel  5. Class 3 severe obesity with serious comorbidity and body mass index (BMI) greater than or equal to 70 in adult, unspecified obesity type (HCC) Maria Stone is currently in the action stage of change. As such, her goal is to continue with weight loss efforts. She has agreed to the Category 4 Plan.   Exercise goals: As is.  Behavioral modification strategies: meal planning and cooking strategies and holiday eating strategies .  Maria Stone has agreed to follow-up with our clinic in 2 to 3 weeks. She was informed of the importance of frequent follow-up visits to maximize her success with intensive lifestyle modifications for her multiple health conditions.   Maria Stone was informed we would discuss her lab results at her next visit unless there is a critical issue that needs to be addressed sooner. Maria Stone agreed to keep her next visit at the agreed upon time to discuss these results.  Objective:   Blood pressure 123/80, pulse 71, temperature 98 F (36.7 C), height  5\' 1"  (1.549 m), weight (!) 338 lb (153.3 kg), SpO2 98 %. Body mass index is 63.86 kg/m.  General: Cooperative, alert, well developed, in no acute distress. HEENT: Conjunctivae and lids unremarkable. Cardiovascular: Regular rhythm.  Lungs: Normal work of breathing. Neurologic: No  focal deficits.   Lab Results  Component Value Date   CREATININE 1.11 (H) 08/27/2020   BUN 35 (H) 08/27/2020   NA 140 08/27/2020   K 5.5 (H) 08/27/2020   CL 100 08/27/2020   CO2 25 08/27/2020   Lab Results  Component Value Date   ALT 17 08/27/2020   AST 18 08/27/2020   ALKPHOS 92 08/27/2020   BILITOT 0.6 08/27/2020   Lab Results  Component Value Date   HGBA1C 5.4 08/27/2020   HGBA1C 5.1 03/05/2020   HGBA1C 5.3 09/26/2019   HGBA1C 5.4 12/30/2018   HGBA1C (L) 03/08/2008    3.6 (NOTE)   The ADA recommends the following therapeutic goals for glycemic   control related to Hgb A1C measurement:   Goal of Therapy:   < 7.0% Hgb A1C   Action Suggested:  > 8.0% Hgb A1C   Ref:  Diabetes Care, 22, Suppl. 1, 1999   Lab Results  Component Value Date   INSULIN 17.1 08/27/2020   INSULIN 12.3 03/05/2020   INSULIN 12.3 09/26/2019   INSULIN 16.9 12/30/2018   Lab Results  Component Value Date   TSH 1.290 03/05/2020   Lab Results  Component Value Date   CHOL 246 (H) 08/27/2020   HDL 44 08/27/2020   LDLCALC 177 (H) 08/27/2020   TRIG 137 08/27/2020   CHOLHDL 7.3 03/07/2008   Lab Results  Component Value Date   WBC 6.3 08/27/2020   HGB 14.1 08/27/2020   HCT 42.2 08/27/2020   MCV 95 08/27/2020   PLT 198 08/27/2020   Lab Results  Component Value Date   IRON 76 07/21/2008   TIBC 295 07/21/2008   FERRITIN 219 07/21/2008    Obesity Behavioral Intervention:   Approximately 15 minutes were spent on the discussion below.  ASK: We discussed the diagnosis of obesity with 09/20/2008 today and [name] agreed to give Maria Stone permission to discuss obesity behavioral modification therapy today.  ASSESS: [name] has the diagnosis of obesity and her BMI today is 63.9. [name] is in the action stage of change.   ADVISE: [name] was educated on the multiple health risks of obesity as well as the benefit of weight loss to improve her health. She was advised of the need for long term  treatment and the importance of lifestyle modifications to improve her current health and to decrease her risk of future health problems.  AGREE: Multiple dietary modification options and treatment options were discussed and [name] agreed to follow the recommendations documented in the above note.  ARRANGE: Syrai was educated on the importance of frequent visits to treat obesity as outlined per CMS and USPSTF guidelines and agreed to schedule her next follow up appointment today.  Attestation Statements:   Reviewed by clinician on day of visit: allergies, medications, problem list, medical history, surgical history, family history, social history, and previous encounter notes.   I, Maria Stone, am acting as transcriptionist for Burt Knack, MD.  I have reviewed the above documentation for accuracy and completeness, and I agree with the above. -  Maria Quince, MD

## 2020-09-20 ENCOUNTER — Ambulatory Visit (INDEPENDENT_AMBULATORY_CARE_PROVIDER_SITE_OTHER): Payer: Medicare Other | Admitting: Family Medicine

## 2020-09-21 ENCOUNTER — Other Ambulatory Visit (INDEPENDENT_AMBULATORY_CARE_PROVIDER_SITE_OTHER): Payer: Self-pay | Admitting: Family Medicine

## 2020-09-21 DIAGNOSIS — E559 Vitamin D deficiency, unspecified: Secondary | ICD-10-CM

## 2020-09-24 ENCOUNTER — Encounter (INDEPENDENT_AMBULATORY_CARE_PROVIDER_SITE_OTHER): Payer: Self-pay

## 2020-09-24 NOTE — Telephone Encounter (Signed)
MyChart message sent to pt to find out if they have enough medication to get them through until next appt.   

## 2020-09-24 NOTE — Telephone Encounter (Signed)
Refill form given to MD for approval/denial.

## 2020-10-10 ENCOUNTER — Other Ambulatory Visit (INDEPENDENT_AMBULATORY_CARE_PROVIDER_SITE_OTHER): Payer: Self-pay | Admitting: Family Medicine

## 2020-10-10 ENCOUNTER — Encounter (INDEPENDENT_AMBULATORY_CARE_PROVIDER_SITE_OTHER): Payer: Self-pay

## 2020-10-10 DIAGNOSIS — R600 Localized edema: Secondary | ICD-10-CM

## 2020-10-10 NOTE — Telephone Encounter (Signed)
MyChart message sent to pt to find out if they have enough medication to get them through until next appt.   

## 2020-10-10 NOTE — Telephone Encounter (Signed)
Last OV with Dr. Beasley 

## 2020-10-15 ENCOUNTER — Encounter (INDEPENDENT_AMBULATORY_CARE_PROVIDER_SITE_OTHER): Payer: Self-pay | Admitting: Family Medicine

## 2020-10-15 ENCOUNTER — Other Ambulatory Visit (INDEPENDENT_AMBULATORY_CARE_PROVIDER_SITE_OTHER): Payer: Self-pay | Admitting: Family Medicine

## 2020-10-15 ENCOUNTER — Other Ambulatory Visit: Payer: Self-pay

## 2020-10-15 ENCOUNTER — Ambulatory Visit (INDEPENDENT_AMBULATORY_CARE_PROVIDER_SITE_OTHER): Payer: Medicare Other | Admitting: Family Medicine

## 2020-10-15 VITALS — BP 140/77 | HR 72 | Temp 97.9°F | Ht 61.0 in | Wt 341.0 lb

## 2020-10-15 DIAGNOSIS — E7849 Other hyperlipidemia: Secondary | ICD-10-CM

## 2020-10-15 DIAGNOSIS — E875 Hyperkalemia: Secondary | ICD-10-CM

## 2020-10-15 DIAGNOSIS — E559 Vitamin D deficiency, unspecified: Secondary | ICD-10-CM | POA: Diagnosis not present

## 2020-10-15 DIAGNOSIS — Z6841 Body Mass Index (BMI) 40.0 and over, adult: Secondary | ICD-10-CM

## 2020-10-15 MED ORDER — VITAMIN D (ERGOCALCIFEROL) 1.25 MG (50000 UNIT) PO CAPS: 50000.0000 [IU] | ORAL_CAPSULE | ORAL | 0 refills | Status: DC

## 2020-10-15 NOTE — Telephone Encounter (Signed)
Dr.Beasley 

## 2020-10-16 ENCOUNTER — Other Ambulatory Visit (INDEPENDENT_AMBULATORY_CARE_PROVIDER_SITE_OTHER): Payer: Self-pay

## 2020-10-16 DIAGNOSIS — R600 Localized edema: Secondary | ICD-10-CM

## 2020-10-16 LAB — COMPREHENSIVE METABOLIC PANEL
ALT: 14 IU/L (ref 0–32)
AST: 18 IU/L (ref 0–40)
Albumin/Globulin Ratio: 1.7 (ref 1.2–2.2)
Albumin: 4 g/dL (ref 3.8–4.8)
Alkaline Phosphatase: 95 IU/L (ref 44–121)
BUN/Creatinine Ratio: 26 (ref 12–28)
BUN: 28 mg/dL — ABNORMAL HIGH (ref 8–27)
Bilirubin Total: 0.5 mg/dL (ref 0.0–1.2)
CO2: 25 mmol/L (ref 20–29)
Calcium: 9.9 mg/dL (ref 8.7–10.3)
Chloride: 104 mmol/L (ref 96–106)
Creatinine, Ser: 1.07 mg/dL — ABNORMAL HIGH (ref 0.57–1.00)
GFR calc Af Amer: 63 mL/min/{1.73_m2} (ref >59)
GFR calc non Af Amer: 55 mL/min/{1.73_m2} — ABNORMAL LOW (ref >59)
Globulin, Total: 2.3 g/dL (ref 1.5–4.5)
Glucose: 103 mg/dL — ABNORMAL HIGH (ref 65–99)
Potassium: 5.1 mmol/L (ref 3.5–5.2)
Sodium: 142 mmol/L (ref 134–144)
Total Protein: 6.3 g/dL (ref 6.0–8.5)

## 2020-10-16 MED ORDER — FUROSEMIDE 40 MG PO TABS: 20.0000 mg | ORAL_TABLET | Freq: Every day | ORAL | 0 refills | Status: DC

## 2020-10-16 NOTE — Progress Notes (Signed)
Chief Complaint:   OBESITY Maria Stone is here to discuss her progress with her obesity treatment plan along with follow-up of her obesity related diagnoses. Maria Stone is on the Category 4 Plan and states she is following her eating plan approximately 75% of the time. Maria Stone states she is walking and dancing (around the house), and chair exercises for 15 minutes 3 times per week.  Today's visit was #: 28 Starting weight: 393 lbs Starting date: 12/30/2018 Today's weight: 341 lbs Today's date: 10/15/2020 Total lbs lost to date: 52 Total lbs lost since last in-office visit: 0  Interim History: Maria Stone did some celebration eating over Thanksgiving and she got a little off track but she is ready to get back on track with her Category 4 plan. She is trying to stay more active overall.  Subjective:   1. Vitamin D deficiency Maria Stone is on Vit D prescription, and her level is improving slowly. I discussed labs with the patient.   2. Hyperkalemia Maria Stone has new diagnosis of hyperkalemia. Her K+ was elevated on her last labs. She has no history of renal failure, likely due to hemolysis as previous K+ was within normal limits. I discussed labs with the patient today.  3. Other hyperlipidemia Maria Stone's LDL is still above goal. She tried multiple statins in the past and had myalgias. She  Is working on weight loss in the meanwhile. I discussed labs with the patient today.  Assessment/Plan:   1. Vitamin D deficiency Low Vitamin D level contributes to fatigue and are associated with obesity, breast, and colon cancer. We will refill prescription Vitamin D for 90 days with no refills. Maria Stone will follow-up for routine testing of Vitamin D, at least 2-3 times per year to avoid over-replacement.  - Vitamin D, Ergocalciferol, (DRISDOL) 1.25 MG (50000 UNIT) CAPS capsule; Take 1 capsule (50,000 Units total) by mouth every 7 (seven) days.  Dispense: 12 capsule; Refill: 0  2. Hyperkalemia We will  check labs today, and Maria Stone will continue to follow up as directed.  - Comprehensive metabolic panel  3. Other hyperlipidemia Cardiovascular risk and specific lipid/LDL goals reviewed. We discussed several lifestyle modifications today. [name] agreed to start CoQ10 300-400 mg q daily and will try a very low dose of Lipitor every other day at her next visit. She will continue to work on diet, exercise and weight loss efforts. Orders and follow up as documented in patient record.   4. Class 3 severe obesity with serious comorbidity and body mass index (BMI) of 60.0 to 69.9 in adult, unspecified obesity type (HCC) Maria Stone is currently in the action stage of change. As such, her goal is to continue with weight loss efforts. She has agreed to the Category 4 Plan.   Exercise goals: As is.  Behavioral modification strategies: holiday eating strategies .  Maria Stone has agreed to follow-up with our clinic in 2 to 3 weeks. She was informed of the importance of frequent follow-up visits to maximize her success with intensive lifestyle modifications for her multiple health conditions.   Maria Stone was informed we would discuss her lab results at her next visit unless there is a critical issue that needs to be addressed sooner. Maria Stone agreed to keep her next visit at the agreed upon time to discuss these results.  Objective:   Blood pressure 140/77, pulse 72, temperature 97.9 F (36.6 C), height 5\' 1"  (1.549 m), weight (!) 341 lb (154.7 kg), SpO2 98 %. Body mass index is 64.43 kg/m.  General:  Cooperative, alert, well developed, in no acute distress. HEENT: Conjunctivae and lids unremarkable. Cardiovascular: Regular rhythm.  Lungs: Normal work of breathing. Neurologic: No focal deficits.   Lab Results  Component Value Date   CREATININE 1.07 (H) 10/15/2020   BUN 28 (H) 10/15/2020   NA 142 10/15/2020   K 5.1 10/15/2020   CL 104 10/15/2020   CO2 25 10/15/2020   Lab Results  Component  Value Date   ALT 14 10/15/2020   AST 18 10/15/2020   ALKPHOS 95 10/15/2020   BILITOT 0.5 10/15/2020   Lab Results  Component Value Date   HGBA1C 5.4 08/27/2020   HGBA1C 5.1 03/05/2020   HGBA1C 5.3 09/26/2019   HGBA1C 5.4 12/30/2018   HGBA1C (L) 03/08/2008    3.6 (NOTE)   The ADA recommends the following therapeutic goals for glycemic   control related to Hgb A1C measurement:   Goal of Therapy:   < 7.0% Hgb A1C   Action Suggested:  > 8.0% Hgb A1C   Ref:  Diabetes Care, 22, Suppl. 1, 1999   Lab Results  Component Value Date   INSULIN 17.1 08/27/2020   INSULIN 12.3 03/05/2020   INSULIN 12.3 09/26/2019   INSULIN 16.9 12/30/2018   Lab Results  Component Value Date   TSH 1.290 03/05/2020   Lab Results  Component Value Date   CHOL 246 (H) 08/27/2020   HDL 44 08/27/2020   LDLCALC 177 (H) 08/27/2020   TRIG 137 08/27/2020   CHOLHDL 7.3 03/07/2008   Lab Results  Component Value Date   WBC 6.3 08/27/2020   HGB 14.1 08/27/2020   HCT 42.2 08/27/2020   MCV 95 08/27/2020   PLT 198 08/27/2020   Lab Results  Component Value Date   IRON 76 07/21/2008   TIBC 295 07/21/2008   FERRITIN 219 07/21/2008    Obesity Behavioral Intervention:   Approximately 15 minutes were spent on the discussion below.  ASK: We discussed the diagnosis of obesity with Maria Stone today and Annmarie agreed to give [name] permission to discuss obesity behavioral modification therapy today.  ASSESS: Maria Stone has the diagnosis of obesity and her BMI today is 64.46. Maria Stone is in the action stage of change.   ADVISE: Maria Stone was educated on the multiple health risks of obesity as well as the benefit of weight loss to improve her health. She was advised of the need for long term treatment and the importance of lifestyle modifications to improve her current health and to decrease her risk of future health problems.  AGREE: Multiple dietary modification options and treatment options were discussed and Maria Stone  agreed to follow the recommendations documented in the above note.  ARRANGE: Maria Stone was educated on the importance of frequent visits to treat obesity as outlined per CMS and USPSTF guidelines and agreed to schedule her next follow up appointment today.  Attestation Statements:   Reviewed by clinician on day of visit: allergies, medications, problem list, medical history, surgical history, family history, social history, and previous encounter notes.   I, Burt Knack, am acting as transcriptionist for Quillian Quince, MD.  I have reviewed the above documentation for accuracy and completeness, and I agree with the above. -  Quillian Quince, MD

## 2020-10-29 ENCOUNTER — Other Ambulatory Visit: Payer: Self-pay

## 2020-10-29 ENCOUNTER — Ambulatory Visit (INDEPENDENT_AMBULATORY_CARE_PROVIDER_SITE_OTHER): Payer: Medicare Other | Admitting: Family Medicine

## 2020-10-29 ENCOUNTER — Encounter (INDEPENDENT_AMBULATORY_CARE_PROVIDER_SITE_OTHER): Payer: Self-pay | Admitting: Family Medicine

## 2020-10-29 VITALS — BP 142/71 | HR 74 | Temp 98.3°F | Ht 61.0 in | Wt 345.0 lb

## 2020-10-29 DIAGNOSIS — Z6841 Body Mass Index (BMI) 40.0 and over, adult: Secondary | ICD-10-CM | POA: Diagnosis not present

## 2020-10-29 DIAGNOSIS — I10 Essential (primary) hypertension: Secondary | ICD-10-CM | POA: Diagnosis not present

## 2020-10-29 DIAGNOSIS — E559 Vitamin D deficiency, unspecified: Secondary | ICD-10-CM

## 2020-10-29 MED ORDER — VITAMIN D (ERGOCALCIFEROL) 1.25 MG (50000 UNIT) PO CAPS: 50000.0000 [IU] | ORAL_CAPSULE | ORAL | 0 refills | Status: DC

## 2020-10-31 NOTE — Progress Notes (Signed)
Chief Complaint:   OBESITY Maria Stone is here to discuss her progress with her obesity treatment plan along with follow-up of her obesity related diagnoses. Maria Stone is on the Category 4 Plan and states she is following her eating plan approximately 95% of the time. Maria Stone states she is walking for 10-15 minutes 3 times per week.  Today's visit was #: 29 Starting weight: 393 lbs Starting date: 12/30/2018 Today's weight: 345 lbs Today's date: 10/29/2020 Total lbs lost to date: 48 Total lbs lost since last in-office visit: 0  Interim History: Maria Stone has had increased temptations this month and her weight is starting to increase. She hasn't been eating all of her protein and it is likely that her RMR has started to decrease.   Subjective:   1. Vitamin D deficiency Maria Stone is on Vit D, and her level is improving. She denies nausea or vomiting, and she requests a refill today.  2. Essential hypertension Maria Stone's blood pressure is elevated today. She has been better controlled previously. She denies chest pain, but she has been having worse osteoarthritis pain.  Assessment/Plan:   1. Vitamin D deficiency Low Vitamin D level contributes to fatigue and are associated with obesity, breast, and colon cancer. We will refill prescription Vitamin D for 90 days with no refills. Maria Stone will follow-up for routine testing of Vitamin D, at least 2-3 times per year to avoid over-replacement.  - Vitamin D, Ergocalciferol, (DRISDOL) 1.25 MG (50000 UNIT) CAPS capsule; Take 1 capsule (50,000 Units total) by mouth every 7 (seven) days.  Dispense: 12 capsule; Refill: 0  2. Essential hypertension Maria Stone will continue with healthy weight loss, diet, and exercise to improve blood pressure control. We will watch for signs of hypotension as she continues her lifestyle modifications. We will recheck her blood pressure in 2 weeks.  3. Class 3 severe obesity with serious comorbidity and body mass  index (BMI) of 60.0 to 69.9 in adult, unspecified obesity type (HCC) Maria Stone is currently in the action stage of change. As such, her goal is to continue with weight loss efforts. She has agreed to the Category 4 Plan.   Exercise goals: As is.  Behavioral modification strategies: increasing lean protein intake and holiday eating strategies .  Maria Stone has agreed to follow-up with our clinic in 2 to 3 weeks. She was informed of the importance of frequent follow-up visits to maximize her success with intensive lifestyle modifications for her multiple health conditions.   Objective:   Blood pressure (!) 142/71, pulse 74, temperature 98.3 F (36.8 C), height 5\' 1"  (1.549 m), weight (!) 345 lb (156.5 kg), SpO2 97 %. Body mass index is 65.19 kg/m.  General: Cooperative, alert, well developed, in no acute distress. HEENT: Conjunctivae and lids unremarkable. Cardiovascular: Regular rhythm.  Lungs: Normal work of breathing. Neurologic: No focal deficits.   Lab Results  Component Value Date   CREATININE 1.07 (H) 10/15/2020   BUN 28 (H) 10/15/2020   NA 142 10/15/2020   K 5.1 10/15/2020   CL 104 10/15/2020   CO2 25 10/15/2020   Lab Results  Component Value Date   ALT 14 10/15/2020   AST 18 10/15/2020   ALKPHOS 95 10/15/2020   BILITOT 0.5 10/15/2020   Lab Results  Component Value Date   HGBA1C 5.4 08/27/2020   HGBA1C 5.1 03/05/2020   HGBA1C 5.3 09/26/2019   HGBA1C 5.4 12/30/2018   HGBA1C (L) 03/08/2008    3.6 (NOTE)   The ADA recommends the following therapeutic  goals for glycemic   control related to Hgb A1C measurement:   Goal of Therapy:   < 7.0% Hgb A1C   Action Suggested:  > 8.0% Hgb A1C   Ref:  Diabetes Care, 22, Suppl. 1, 1999   Lab Results  Component Value Date   INSULIN 17.1 08/27/2020   INSULIN 12.3 03/05/2020   INSULIN 12.3 09/26/2019   INSULIN 16.9 12/30/2018   Lab Results  Component Value Date   TSH 1.290 03/05/2020   Lab Results  Component Value Date    CHOL 246 (H) 08/27/2020   HDL 44 08/27/2020   LDLCALC 177 (H) 08/27/2020   TRIG 137 08/27/2020   CHOLHDL 7.3 03/07/2008   Lab Results  Component Value Date   WBC 6.3 08/27/2020   HGB 14.1 08/27/2020   HCT 42.2 08/27/2020   MCV 95 08/27/2020   PLT 198 08/27/2020   Lab Results  Component Value Date   IRON 76 07/21/2008   TIBC 295 07/21/2008   FERRITIN 219 07/21/2008    Obesity Behavioral Intervention:   Approximately 15 minutes were spent on the discussion below.  ASK: We discussed the diagnosis of obesity with Maria Stone today and Maria Stone agreed to give [name] permission to discuss obesity behavioral modification therapy today.  ASSESS: Lineth has the diagnosis of obesity and her BMI today is 65.22. Maria Stone is in the action stage of change.   ADVISE: Maria Stone was educated on the multiple health risks of obesity as well as the benefit of weight loss to improve her health. She was advised of the need for long term treatment and the importance of lifestyle modifications to improve her current health and to decrease her risk of future health problems.  AGREE: Multiple dietary modification options and treatment options were discussed and Maria Stone agreed to follow the recommendations documented in the above note.  ARRANGE: Maria Stone was educated on the importance of frequent visits to treat obesity as outlined per CMS and USPSTF guidelines and agreed to schedule her next follow up appointment today.  Attestation Statements:   Reviewed by clinician on day of visit: allergies, medications, problem list, medical history, surgical history, family history, social history, and previous encounter notes.   I, Burt Knack, am acting as transcriptionist for Quillian Quince, MD.  I have reviewed the above documentation for accuracy and completeness, and I agree with the above. -  Quillian Quince, MD

## 2020-11-09 ENCOUNTER — Encounter (INDEPENDENT_AMBULATORY_CARE_PROVIDER_SITE_OTHER): Payer: Self-pay | Admitting: Family Medicine

## 2020-11-12 ENCOUNTER — Telehealth (INDEPENDENT_AMBULATORY_CARE_PROVIDER_SITE_OTHER): Payer: Medicare Other | Admitting: Family Medicine

## 2020-11-12 ENCOUNTER — Encounter (INDEPENDENT_AMBULATORY_CARE_PROVIDER_SITE_OTHER): Payer: Self-pay | Admitting: Family Medicine

## 2020-11-12 DIAGNOSIS — Z6841 Body Mass Index (BMI) 40.0 and over, adult: Secondary | ICD-10-CM

## 2020-11-12 DIAGNOSIS — E559 Vitamin D deficiency, unspecified: Secondary | ICD-10-CM

## 2020-11-12 DIAGNOSIS — Z20822 Contact with and (suspected) exposure to covid-19: Secondary | ICD-10-CM

## 2020-11-12 MED ORDER — VITAMIN D (ERGOCALCIFEROL) 1.25 MG (50000 UNIT) PO CAPS: 50000.0000 [IU] | ORAL_CAPSULE | ORAL | 0 refills | Status: DC

## 2020-11-14 NOTE — Progress Notes (Signed)
TeleHealth Visit:  Due to the COVID-19 pandemic, this visit was completed with telemedicine (audio/video) technology to reduce patient and provider exposure as well as to preserve personal protective equipment.   Maria Stone has verbally consented to this TeleHealth visit. The patient is located at home, the provider is located at the [facility] office. The participants in this visit include the listed provider and patient. Maria Stone was unable to use realtime audiovisual technology today and the telehealth visit was conducted via telephone.   Chief Complaint: OBESITY Maria Stone is here to discuss her progress with her obesity treatment plan along with follow-up of her obesity related diagnoses. Maria Stone is on the Category 4 Plan and states she is following her eating plan approximately 95% of the time. Maria Stone states she is walking more 3 times per week.  Today's visit was #: 30 Starting weight: 393 lbs Starting date: 12/30/2018  Interim History: Maria Stone had to change to an audio visit due to COVID19 exposure from family. She states she has done well following her Category 4 plan, but she feels a bit bloated today. She does not have a scale to weigh herself at home. She has increased walking recently.  Subjective:   1. Vitamin D deficiency Maria Stone's Vit D level is almost at goal on Vit D prescription. She denies nausea, vomiting, or muscle weakness. I discussed labs with the patient today.  2. Exposure to COVID-19 virus Maria Stone has had multiple family members with COVID-19 infection, who she saw over the holidays. Her last exposure was 4 days ago, but she has no symptoms. She tried to get a COVID-19 test but all sites are sold out. She has been isolated since just to be safe.  Assessment/Plan:   1. Vitamin D deficiency Low Vitamin D level contributes to fatigue and are associated with obesity, breast, and colon cancer. She agrees to continue to take prescription Vitamin  D @50 ,000 IU every week and will follow-up for routine testing of Vitamin D, at least 2-3 times per year to avoid over-replacement.  - Vitamin D, Ergocalciferol, (DRISDOL) 1.25 MG (50000 UNIT) CAPS capsule; Take 1 capsule (50,000 Units total) by mouth every 7 (seven) days.  Dispense: 12 capsule; Refill: 0  2. Exposure to COVID-19 virus Maria Stone was encouraged to isolate another 3 days and contact her primary care physician if any symptoms develop as she is at high risk for complications due to her weight and mobility issues. We will continue to monitor. Orders and follow up as documented in patient record.  3. Class 3 severe obesity with serious comorbidity and body mass index (BMI) of 60.0 to 69.9 in adult, unspecified obesity type (HCC) Maria Stone is currently in the action stage of change. As such, her goal is to continue with weight loss efforts. She has agreed to the Category 4 Plan.   Exercise goals: As is.  Behavioral modification strategies: meal planning and cooking strategies.  Maria Stone has agreed to follow-up with our clinic in 2 weeks. She was informed of the importance of frequent follow-up visits to maximize her success with intensive lifestyle modifications for her multiple health conditions.  Objective:   VITALS: Per patient if applicable, see vitals. GENERAL: Alert and in no acute distress. CARDIOPULMONARY: No increased WOB. Speaking in clear sentences.  PSYCH: Pleasant and cooperative. Speech normal rate and rhythm. Affect is appropriate. Insight and judgement are appropriate. Attention is focused, linear, and appropriate.  NEURO: Oriented as arrived to appointment on time with no prompting.  Lab Results  Component Value Date   CREATININE 1.07 (H) 10/15/2020   BUN 28 (H) 10/15/2020   NA 142 10/15/2020   K 5.1 10/15/2020   CL 104 10/15/2020   CO2 25 10/15/2020   Lab Results  Component Value Date   ALT 14 10/15/2020   AST 18 10/15/2020   ALKPHOS 95 10/15/2020    BILITOT 0.5 10/15/2020   Lab Results  Component Value Date   HGBA1C 5.4 08/27/2020   HGBA1C 5.1 03/05/2020   HGBA1C 5.3 09/26/2019   HGBA1C 5.4 12/30/2018   HGBA1C (L) 03/08/2008    3.6 (NOTE)   The ADA recommends the following therapeutic goals for glycemic   control related to Hgb A1C measurement:   Goal of Therapy:   < 7.0% Hgb A1C   Action Suggested:  > 8.0% Hgb A1C   Ref:  Diabetes Care, 22, Suppl. 1, 1999   Lab Results  Component Value Date   INSULIN 17.1 08/27/2020   INSULIN 12.3 03/05/2020   INSULIN 12.3 09/26/2019   INSULIN 16.9 12/30/2018   Lab Results  Component Value Date   TSH 1.290 03/05/2020   Lab Results  Component Value Date   CHOL 246 (H) 08/27/2020   HDL 44 08/27/2020   LDLCALC 177 (H) 08/27/2020   TRIG 137 08/27/2020   CHOLHDL 7.3 03/07/2008   Lab Results  Component Value Date   WBC 6.3 08/27/2020   HGB 14.1 08/27/2020   HCT 42.2 08/27/2020   MCV 95 08/27/2020   PLT 198 08/27/2020   Lab Results  Component Value Date   IRON 76 07/21/2008   TIBC 295 07/21/2008   FERRITIN 219 07/21/2008    Attestation Statements:   Reviewed by clinician on day of visit: allergies, medications, problem list, medical history, surgical history, family history, social history, and previous encounter notes.   I, Burt Knack, am acting as transcriptionist for Quillian Quince, MD.  I have reviewed the above documentation for accuracy and completeness, and I agree with the above. - Quillian Quince, MD

## 2020-11-27 ENCOUNTER — Encounter (INDEPENDENT_AMBULATORY_CARE_PROVIDER_SITE_OTHER): Payer: Self-pay

## 2020-11-28 ENCOUNTER — Telehealth (INDEPENDENT_AMBULATORY_CARE_PROVIDER_SITE_OTHER): Payer: Medicare Other | Admitting: Family Medicine

## 2020-11-28 ENCOUNTER — Encounter (INDEPENDENT_AMBULATORY_CARE_PROVIDER_SITE_OTHER): Payer: Self-pay | Admitting: Family Medicine

## 2020-11-28 ENCOUNTER — Other Ambulatory Visit: Payer: Self-pay

## 2020-11-28 DIAGNOSIS — E559 Vitamin D deficiency, unspecified: Secondary | ICD-10-CM

## 2020-11-28 DIAGNOSIS — Z6841 Body Mass Index (BMI) 40.0 and over, adult: Secondary | ICD-10-CM | POA: Diagnosis not present

## 2020-11-28 MED ORDER — VITAMIN D (ERGOCALCIFEROL) 1.25 MG (50000 UNIT) PO CAPS
50000.0000 [IU] | ORAL_CAPSULE | ORAL | 0 refills | Status: DC
Start: 2020-11-28 — End: 2021-01-07

## 2020-11-29 NOTE — Progress Notes (Signed)
TeleHealth Visit:  Due to the COVID-19 pandemic, this visit was completed with telemedicine (audio/video) technology to reduce patient and provider exposure as well as to preserve personal protective equipment.   Maria Stone has verbally consented to this TeleHealth visit. The patient is located at home, the provider is located at the [facility] office. The participants in this visit include the listed provider and patient. The visit was conducted today via MyChart Video.   Chief Complaint: OBESITY Maria Stone is here to discuss her progress with her obesity treatment plan along with follow-up of her obesity related diagnoses. Maria Stone is on the Category 4 Plan and states she is following her eating plan approximately 95% of the time. Maria Stone states she is walking, lifting things, and doing chair exercises for 20 minutes 3 times per week.  Today's visit was #: 31 Starting weight: 393 lbs Starting date: 12/31/2019  Interim History: Maria Stone has done well with weight loss. She has gotten back to her Category 4 plan more strictly, and she has already lost another 5 lbs. She is trying to be active but the cold has been affecting her activities.  Subjective:   1. Vitamin D deficiency Maria Stone is stable on Vit D, and she requests a refill today. She denies nausea, vomiting, or muscle weakness.  Assessment/Plan:   1. Vitamin D deficiency Low Vitamin D level contributes to fatigue and are associated with obesity, breast, and colon cancer. We will refill prescription Vitamin D for 90 days with no refills. Elyn will follow-up for routine testing of Vitamin D, at least 2-3 times per year to avoid over-replacement.  - Vitamin D, Ergocalciferol, (DRISDOL) 1.25 MG (50000 UNIT) CAPS capsule; Take 1 capsule (50,000 Units total) by mouth every 7 (seven) days.  Dispense: 12 capsule; Refill: 0  2. Class 3 severe obesity with serious comorbidity and body mass index (BMI) of 60.0 to 69.9  in adult, unspecified obesity type (HCC) Maria Stone is currently in the action stage of change. As such, her goal is to continue with weight loss efforts. She has agreed to the Category 4 Plan.   Exercise goals: All adults should avoid inactivity. Some physical activity is better than none, and adults who participate in any amount of physical activity gain some health benefits.  Behavioral modification strategies: meal planning and cooking strategies.  Maria Stone has agreed to follow-up with our clinic in 2 to 3 weeks. She was informed of the importance of frequent follow-up visits to maximize her success with intensive lifestyle modifications for her multiple health conditions.  Objective:   VITALS: Per patient if applicable, see vitals. GENERAL: Alert and in no acute distress. CARDIOPULMONARY: No increased WOB. Speaking in clear sentences.  PSYCH: Pleasant and cooperative. Speech normal rate and rhythm. Affect is appropriate. Insight and judgement are appropriate. Attention is focused, linear, and appropriate.  NEURO: Oriented as arrived to appointment on time with no prompting.   Lab Results  Component Value Date   CREATININE 1.07 (H) 10/15/2020   BUN 28 (H) 10/15/2020   NA 142 10/15/2020   K 5.1 10/15/2020   CL 104 10/15/2020   CO2 25 10/15/2020   Lab Results  Component Value Date   ALT 14 10/15/2020   AST 18 10/15/2020   ALKPHOS 95 10/15/2020   BILITOT 0.5 10/15/2020   Lab Results  Component Value Date   HGBA1C 5.4 08/27/2020   HGBA1C 5.1 03/05/2020   HGBA1C 5.3 09/26/2019   HGBA1C 5.4 12/30/2018   HGBA1C (L) 03/08/2008  3.6 (NOTE)   The ADA recommends the following therapeutic goals for glycemic   control related to Hgb A1C measurement:   Goal of Therapy:   < 7.0% Hgb A1C   Action Suggested:  > 8.0% Hgb A1C   Ref:  Diabetes Care, 22, Suppl. 1, 1999   Lab Results  Component Value Date   INSULIN 17.1 08/27/2020   INSULIN 12.3 03/05/2020   INSULIN 12.3 09/26/2019    INSULIN 16.9 12/30/2018   Lab Results  Component Value Date   TSH 1.290 03/05/2020   Lab Results  Component Value Date   CHOL 246 (H) 08/27/2020   HDL 44 08/27/2020   LDLCALC 177 (H) 08/27/2020   TRIG 137 08/27/2020   CHOLHDL 7.3 03/07/2008   Lab Results  Component Value Date   WBC 6.3 08/27/2020   HGB 14.1 08/27/2020   HCT 42.2 08/27/2020   MCV 95 08/27/2020   PLT 198 08/27/2020   Lab Results  Component Value Date   IRON 76 07/21/2008   TIBC 295 07/21/2008   FERRITIN 219 07/21/2008    Attestation Statements:   Reviewed by clinician on day of visit: allergies, medications, problem list, medical history, surgical history, family history, social history, and previous encounter notes.   I, Burt Knack, am acting as transcriptionist for Quillian Quince, MD.  I have reviewed the above documentation for accuracy and completeness, and I agree with the above. - Quillian Quince, MD

## 2020-12-18 ENCOUNTER — Other Ambulatory Visit: Payer: Self-pay

## 2020-12-18 ENCOUNTER — Encounter (INDEPENDENT_AMBULATORY_CARE_PROVIDER_SITE_OTHER): Payer: Self-pay | Admitting: Family Medicine

## 2020-12-18 ENCOUNTER — Ambulatory Visit (INDEPENDENT_AMBULATORY_CARE_PROVIDER_SITE_OTHER): Payer: Medicare Other | Admitting: Family Medicine

## 2020-12-18 VITALS — BP 112/67 | HR 71 | Temp 98.0°F | Ht 61.0 in | Wt 341.0 lb

## 2020-12-18 DIAGNOSIS — Z6841 Body Mass Index (BMI) 40.0 and over, adult: Secondary | ICD-10-CM | POA: Diagnosis not present

## 2020-12-18 DIAGNOSIS — I10 Essential (primary) hypertension: Secondary | ICD-10-CM

## 2020-12-18 NOTE — Progress Notes (Signed)
Chief Complaint:   OBESITY Maria Stone is here to discuss her progress with her obesity treatment plan along with follow-up of her obesity related diagnoses. Maria Stone is on the Category 4 Plan and states she is following her eating plan approximately 95% of the time. Maria Stone states she is walking and doing chair exercises for 20 minutes 3 times per week.  Today's visit was #: 32 Starting weight: 393 lbs Starting date: 12/31/2019 Today's weight: 341 lbs Today's date: 12/18/2020 Total lbs lost to date: 52 Total lbs lost since last in-office visit: 4  Interim History: Maria Stone has done much better with following her Category 4 plan, and she is doing well with her weight loss efforts. She already notes feeling better and with improved energy.  Subjective:   1. Essential hypertension Jury's last 2 blood pressure readings in the office were elevated, but her blood pressure today was within normal limits. She denies signs of feeling lightheaded or dizzy.  Assessment/Plan:   1. Essential hypertension Smt. will continue diet, exercise, and her medications to improve blood pressure control. She will continue to monitor for signs of hypotension or dehydration.  2. Class 3 severe obesity with serious comorbidity and body mass index (BMI) of 60.0 to 69.9 in adult, unspecified obesity type (HCC) Maria Stone is currently in the action stage of change. As such, her goal is to continue with weight loss efforts. She has agreed to the Category 4 Plan.   Exercise goals: As is.  Behavioral modification strategies: increasing lean protein intake and meal planning and cooking strategies.  Maria Stone has agreed to follow-up with our clinic in 3 weeks. She was informed of the importance of frequent follow-up visits to maximize her success with intensive lifestyle modifications for her multiple health conditions.   Objective:   Blood pressure 112/67, pulse 71, temperature 98 F (36.7 C), height 5'  1" (1.549 m), weight (!) 341 lb (154.7 kg), SpO2 98 %. Body mass index is 64.43 kg/m.  General: Cooperative, alert, well developed, in no acute distress. HEENT: Conjunctivae and lids unremarkable. Cardiovascular: Regular rhythm.  Lungs: Normal work of breathing. Neurologic: No focal deficits.   Lab Results  Component Value Date   CREATININE 1.07 (H) 10/15/2020   BUN 28 (H) 10/15/2020   NA 142 10/15/2020   K 5.1 10/15/2020   CL 104 10/15/2020   CO2 25 10/15/2020   Lab Results  Component Value Date   ALT 14 10/15/2020   AST 18 10/15/2020   ALKPHOS 95 10/15/2020   BILITOT 0.5 10/15/2020   Lab Results  Component Value Date   HGBA1C 5.4 08/27/2020   HGBA1C 5.1 03/05/2020   HGBA1C 5.3 09/26/2019   HGBA1C 5.4 12/30/2018   HGBA1C (L) 03/08/2008    3.6 (NOTE)   The ADA recommends the following therapeutic goals for glycemic   control related to Hgb A1C measurement:   Goal of Therapy:   < 7.0% Hgb A1C   Action Suggested:  > 8.0% Hgb A1C   Ref:  Diabetes Care, 22, Suppl. 1, 1999   Lab Results  Component Value Date   INSULIN 17.1 08/27/2020   INSULIN 12.3 03/05/2020   INSULIN 12.3 09/26/2019   INSULIN 16.9 12/30/2018   Lab Results  Component Value Date   TSH 1.290 03/05/2020   Lab Results  Component Value Date   CHOL 246 (H) 08/27/2020   HDL 44 08/27/2020   LDLCALC 177 (H) 08/27/2020   TRIG 137 08/27/2020   CHOLHDL 7.3 03/07/2008  Lab Results  Component Value Date   WBC 6.3 08/27/2020   HGB 14.1 08/27/2020   HCT 42.2 08/27/2020   MCV 95 08/27/2020   PLT 198 08/27/2020   Lab Results  Component Value Date   IRON 76 07/21/2008   TIBC 295 07/21/2008   FERRITIN 219 07/21/2008   Attestation Statements:   Reviewed by clinician on day of visit: allergies, medications, problem list, medical history, surgical history, family history, social history, and previous encounter notes.  Time spent on visit including pre-visit chart review and post-visit care and charting  was 30 minutes.    I, Burt Knack, am acting as transcriptionist for Quillian Quince, MD.  I have reviewed the above documentation for accuracy and completeness, and I agree with the above. -  Quillian Quince, MD

## 2021-01-07 ENCOUNTER — Ambulatory Visit (INDEPENDENT_AMBULATORY_CARE_PROVIDER_SITE_OTHER): Payer: Medicare Other | Admitting: Family Medicine

## 2021-01-07 ENCOUNTER — Encounter (INDEPENDENT_AMBULATORY_CARE_PROVIDER_SITE_OTHER): Payer: Self-pay | Admitting: Family Medicine

## 2021-01-07 ENCOUNTER — Other Ambulatory Visit: Payer: Self-pay

## 2021-01-07 VITALS — BP 107/68 | HR 77 | Temp 98.5°F | Ht 61.0 in | Wt 340.0 lb

## 2021-01-07 DIAGNOSIS — Z6841 Body Mass Index (BMI) 40.0 and over, adult: Secondary | ICD-10-CM

## 2021-01-07 DIAGNOSIS — E559 Vitamin D deficiency, unspecified: Secondary | ICD-10-CM

## 2021-01-07 MED ORDER — VITAMIN D (ERGOCALCIFEROL) 1.25 MG (50000 UNIT) PO CAPS
ORAL_CAPSULE | ORAL | 0 refills | Status: DC
Start: 2021-01-07 — End: 2021-01-28

## 2021-01-08 NOTE — Progress Notes (Signed)
Chief Complaint:   OBESITY Maria Stone is here to discuss her progress with her obesity treatment plan along with follow-up of her obesity related diagnoses. Maria Stone is on the Category 4 Plan and states she is following her eating plan approximately 85% of the time. Maria Stone states she is walking and doing chair exercises for 15-20 minutes 3-4 times per week.  Today's visit was #: 33 Starting weight: 393 lbs Starting date: 12/31/2019 Today's weight: 340 lbs Today's date: 01/07/2021 Total lbs lost to date: 53 Total lbs lost since last in-office visit: 1  Interim History: Maria Stone continues to do well with weight loss. She now has invisalign braces and her teeth ache as she is getting used to this. This pain makes eating protein difficult.  Subjective:   1. Vitamin Stone deficiency Maria Stone is stable on Maria Stone, and she denies nausea or vomiting. Her Maria Stone level is not yet at goal.  Assessment/Plan:   1. Vitamin Stone deficiency Low Vitamin Stone level contributes to fatigue and are associated with obesity, breast, and colon cancer. We will refill prescription Vitamin Stone for 90 days with no refills. Maria Stone will follow-up for routine testing of Vitamin Stone, at least 2-3 times per year to avoid over-replacement.  - Vitamin Stone, Ergocalciferol, (DRISDOL) 1.25 MG (50000 UNIT) CAPS capsule; Take 1 capsule by mouth once every 7 days. DX: E55.9  Dispense: 12 capsule; Refill: 0  2. Class 3 severe obesity with serious comorbidity and body mass index (BMI) of 60.0 to 69.9 in adult, unspecified obesity type (HCC) Maria Stone is currently in the action stage of change. As such, her goal is to continue with weight loss efforts. She has agreed to the Category 4 Plan.   Soft protein food options were discussed and recipes were given today.  Exercise goals: As is.  Behavioral modification strategies: increasing lean protein intake and meal planning and cooking strategies.  Maria Stone has agreed to follow-up with our  clinic in 2 weeks. She was informed of the importance of frequent follow-up visits to maximize her success with intensive lifestyle modifications for her multiple health conditions.   Objective:   Blood pressure 107/68, pulse 77, temperature 98.5 F (36.9 C), height 5\' 1"  (1.549 m), weight (!) 340 lb (154.2 kg), SpO2 98 %. Body mass index is 64.24 kg/m.  General: Cooperative, alert, well developed, in no acute distress. HEENT: Conjunctivae and lids unremarkable. Cardiovascular: Regular rhythm.  Lungs: Normal work of breathing. Neurologic: No focal deficits.   Lab Results  Component Value Date   CREATININE 1.07 (H) 10/15/2020   BUN 28 (H) 10/15/2020   NA 142 10/15/2020   K 5.1 10/15/2020   CL 104 10/15/2020   CO2 25 10/15/2020   Lab Results  Component Value Date   ALT 14 10/15/2020   AST 18 10/15/2020   ALKPHOS 95 10/15/2020   BILITOT 0.5 10/15/2020   Lab Results  Component Value Date   HGBA1C 5.4 08/27/2020   HGBA1C 5.1 03/05/2020   HGBA1C 5.3 09/26/2019   HGBA1C 5.4 12/30/2018   HGBA1C (L) 03/08/2008    3.6 (NOTE)   The ADA recommends the following therapeutic goals for glycemic   control related to Hgb A1C measurement:   Goal of Therapy:   < 7.0% Hgb A1C   Action Suggested:  > 8.0% Hgb A1C   Ref:  Diabetes Care, 22, Suppl. 1, 1999   Lab Results  Component Value Date   INSULIN 17.1 08/27/2020   INSULIN 12.3 03/05/2020  INSULIN 12.3 09/26/2019   INSULIN 16.9 12/30/2018   Lab Results  Component Value Date   TSH 1.290 03/05/2020   Lab Results  Component Value Date   CHOL 246 (H) 08/27/2020   HDL 44 08/27/2020   LDLCALC 177 (H) 08/27/2020   TRIG 137 08/27/2020   CHOLHDL 7.3 03/07/2008   Lab Results  Component Value Date   WBC 6.3 08/27/2020   HGB 14.1 08/27/2020   HCT 42.2 08/27/2020   MCV 95 08/27/2020   PLT 198 08/27/2020   Lab Results  Component Value Date   IRON 76 07/21/2008   TIBC 295 07/21/2008   FERRITIN 219 07/21/2008    Obesity  Behavioral Intervention:   Approximately 15 minutes were spent on the discussion below.  ASK: We discussed the diagnosis of obesity with [name] today and [name] agreed to give [name] permission to discuss obesity behavioral modification therapy today.  ASSESS: [name] has the diagnosis of obesity and her BMI today is 64.28. Maria Stone is in the action stage of change.   ADVISE: Maria Stone was educated on the multiple health risks of obesity as well as the benefit of weight loss to improve her health. She was advised of the need for long term treatment and the importance of lifestyle modifications to improve her current health and to decrease her risk of future health problems.  AGREE: Multiple dietary modification options and treatment options were discussed and Maria Stone agreed to follow the recommendations documented in the above note.  ARRANGE: Maria Stone was educated on the importance of frequent visits to treat obesity as outlined per CMS and USPSTF guidelines and agreed to schedule her next follow up appointment today.  Attestation Statements:   Reviewed by clinician on day of visit: allergies, medications, problem list, medical history, surgical history, family history, social history, and previous encounter notes.   I, Burt Knack, am acting as transcriptionist for Quillian Quince, MD.  I have reviewed the above documentation for accuracy and completeness, and I agree with the above. -  Quillian Quince, MD

## 2021-01-20 ENCOUNTER — Other Ambulatory Visit (INDEPENDENT_AMBULATORY_CARE_PROVIDER_SITE_OTHER): Payer: Self-pay | Admitting: Family Medicine

## 2021-01-20 DIAGNOSIS — R600 Localized edema: Secondary | ICD-10-CM

## 2021-01-21 ENCOUNTER — Encounter (INDEPENDENT_AMBULATORY_CARE_PROVIDER_SITE_OTHER): Payer: Self-pay

## 2021-01-21 NOTE — Telephone Encounter (Signed)
MyChart message sent to pt to find out if they have enough medication to get them through until next appt.   

## 2021-01-21 NOTE — Telephone Encounter (Signed)
Last OV with Dr. Beasley 

## 2021-01-24 ENCOUNTER — Other Ambulatory Visit (INDEPENDENT_AMBULATORY_CARE_PROVIDER_SITE_OTHER): Payer: Self-pay | Admitting: Family Medicine

## 2021-01-24 DIAGNOSIS — R600 Localized edema: Secondary | ICD-10-CM

## 2021-01-24 NOTE — Telephone Encounter (Signed)
Would you like to refill? 

## 2021-01-24 NOTE — Telephone Encounter (Signed)
yes

## 2021-01-28 ENCOUNTER — Ambulatory Visit (INDEPENDENT_AMBULATORY_CARE_PROVIDER_SITE_OTHER): Payer: Medicare Other | Admitting: Family Medicine

## 2021-01-28 ENCOUNTER — Encounter (INDEPENDENT_AMBULATORY_CARE_PROVIDER_SITE_OTHER): Payer: Self-pay | Admitting: Family Medicine

## 2021-01-28 ENCOUNTER — Other Ambulatory Visit: Payer: Self-pay

## 2021-01-28 VITALS — BP 131/66 | HR 69 | Temp 98.1°F | Ht 61.0 in | Wt 342.0 lb

## 2021-01-28 DIAGNOSIS — E559 Vitamin D deficiency, unspecified: Secondary | ICD-10-CM | POA: Diagnosis not present

## 2021-01-28 DIAGNOSIS — R609 Edema, unspecified: Secondary | ICD-10-CM

## 2021-01-28 DIAGNOSIS — I10 Essential (primary) hypertension: Secondary | ICD-10-CM | POA: Diagnosis not present

## 2021-01-28 DIAGNOSIS — Z6841 Body Mass Index (BMI) 40.0 and over, adult: Secondary | ICD-10-CM | POA: Diagnosis not present

## 2021-01-28 MED ORDER — VITAMIN D (ERGOCALCIFEROL) 1.25 MG (50000 UNIT) PO CAPS
ORAL_CAPSULE | ORAL | 0 refills | Status: DC
Start: 2021-01-28 — End: 2021-04-11

## 2021-01-31 NOTE — Progress Notes (Signed)
Chief Complaint:   OBESITY Maria Stone is here to discuss her progress with her obesity treatment plan along with follow-up of her obesity related diagnoses.   Today's visit was #: 34 Starting weight: 393 lbs Starting date: 12/31/2019 Today's weight: 342 lbs Today's date: 01/28/2021 Total lbs lost to date: 51 lbs Body mass index is 64.62 kg/m.  Total weight loss percentage to date: -12.98%  Interim History:  Maria Stone endorses dental pain.  She has Invisalign and is on week 8 of 15.  She is not getting in all protein/calories each day.  She is eating more yogurt and oatmeal.  We discussed that it is okay to have a protein shake as a last resort.  Consider trying salmon as a softer protein option.  Current Meal Plan: the Category 4 Plan for 85% of the time.  Current Exercise Plan: Chair exercises for 15-20 minutes 3 times per week.  Assessment/Plan:   1. Vitamin D deficiency Not optimized. Current vitamin D is 49.1, tested on 08/27/2020. Optimal goal > 50 ng/dL.  She is taking vitamin D 50,000 IU weekly.  Plan: Continue to take prescription Vitamin D @50 ,000 IU every week as prescribed.  Follow-up for routine testing of Vitamin D, at least 2-3 times per year to avoid over-replacement.  - Refill Vitamin D, Ergocalciferol, (DRISDOL) 1.25 MG (50000 UNIT) CAPS capsule; Take 1 capsule by mouth once every 7 days. DX: E55.9  Dispense: 12 capsule; Refill: 0  2. Essential hypertension Elevated today compared to baseline.  Increased edema.  Medications: lisinopril 10 mg daily.   Plan:  Okay to take extra Lasix.  Avoid buying foods that are: processed, frozen, or prepackaged to avoid excess salt. We will watch for signs of hypotension as she continues lifestyle modifications. We will continue to monitor closely alongside her PCP and/or Specialist.  Regular follow up with PCP and specialists was also encouraged.   BP Readings from Last 3 Encounters:  01/28/21 131/66  01/07/21 107/68   12/18/20 112/67   Lab Results  Component Value Date   CREATININE 1.07 (H) 10/15/2020   3. Lipedema, lower extremeties Discussed diagnosis and treatment.  Consider lymphedema clinic referral.  4. Class 3 severe obesity with serious comorbidity and body mass index (BMI) of 60.0 to 69.9 in adult, unspecified obesity type [facility])  Course: Maria Stone is currently in the action stage of change. As such, her goal is to continue with weight loss efforts.   Nutrition goals: She has agreed to the Category 4 Plan.   Exercise goals: As is.  Behavioral modification strategies: increasing lean protein intake, decreasing simple carbohydrates, increasing water intake and decreasing sodium intake.  Maria Stone has agreed to follow-up with our clinic in 4 weeks. She was informed of the importance of frequent follow-up visits to maximize her success with intensive lifestyle modifications for her multiple health conditions.   Objective:   Blood pressure 131/66, pulse 69, temperature 98.1 F (36.7 C), temperature source Oral, height 5\' 1"  (1.549 m), weight (!) 342 lb (155.1 kg), SpO2 96 %. Body mass index is 64.62 kg/m.  General: Cooperative, alert, well developed, in no acute distress. HEENT: Conjunctivae and lids unremarkable. Cardiovascular: Regular rhythm.  Lungs: Normal work of breathing. Neurologic: No focal deficits.   Lab Results  Component Value Date   CREATININE 1.07 (H) 10/15/2020   BUN 28 (H) 10/15/2020   NA 142 10/15/2020   K 5.1 10/15/2020   CL 104 10/15/2020   CO2 25 10/15/2020   Lab Results  Component Value Date   ALT 14 10/15/2020   AST 18 10/15/2020   ALKPHOS 95 10/15/2020   BILITOT 0.5 10/15/2020   Lab Results  Component Value Date   HGBA1C 5.4 08/27/2020   HGBA1C 5.1 03/05/2020   HGBA1C 5.3 09/26/2019   HGBA1C 5.4 12/30/2018   HGBA1C (L) 03/08/2008    3.6 (NOTE)   The ADA recommends the following therapeutic goals for glycemic   control related to Hgb A1C  measurement:   Goal of Therapy:   < 7.0% Hgb A1C   Action Suggested:  > 8.0% Hgb A1C   Ref:  Diabetes Care, 22, Suppl. 1, 1999   Lab Results  Component Value Date   INSULIN 17.1 08/27/2020   INSULIN 12.3 03/05/2020   INSULIN 12.3 09/26/2019   INSULIN 16.9 12/30/2018   Lab Results  Component Value Date   TSH 1.290 03/05/2020   Lab Results  Component Value Date   CHOL 246 (H) 08/27/2020   HDL 44 08/27/2020   LDLCALC 177 (H) 08/27/2020   TRIG 137 08/27/2020   CHOLHDL 7.3 03/07/2008   Lab Results  Component Value Date   WBC 6.3 08/27/2020   HGB 14.1 08/27/2020   HCT 42.2 08/27/2020   MCV 95 08/27/2020   PLT 198 08/27/2020   Lab Results  Component Value Date   IRON 76 07/21/2008   TIBC 295 07/21/2008   FERRITIN 219 07/21/2008   Obesity Behavioral Intervention:   Approximately 15 minutes were spent on the discussion below.  ASK: We discussed the diagnosis of obesity with Maria Stone today and [place] agreed to give Maria Stone permission to discuss obesity behavioral modification therapy today.  ASSESS: Maria Stone has the diagnosis of obesity and her BMI today is 64.6. Maria Stone is in the action stage of change.   ADVISE: Maria Stone was educated on the multiple health risks of obesity as well as the benefit of weight loss to improve her health. She was advised of the need for long term treatment and the importance of lifestyle modifications to improve her current health and to decrease her risk of future health problems.  AGREE: Multiple dietary modification options and treatment options were discussed and Maria Stone agreed to follow the recommendations documented in the above note.  ARRANGE: Maria Stone was educated on the importance of frequent visits to treat obesity as outlined per CMS and USPSTF guidelines and agreed to schedule her next follow up appointment today.  Attestation Statements:   Reviewed by clinician on day of visit: allergies, medications, problem list, medical  history, surgical history, family history, social history, and previous encounter notes.  I, Insurance claims handler, CMA, am acting as transcriptionist for Maria Rima, DO  I have reviewed the above documentation for accuracy and completeness, and I agree with the above. Maria Rima, DO

## 2021-02-25 ENCOUNTER — Ambulatory Visit (INDEPENDENT_AMBULATORY_CARE_PROVIDER_SITE_OTHER): Payer: Medicare Other | Admitting: Family Medicine

## 2021-03-25 ENCOUNTER — Encounter (INDEPENDENT_AMBULATORY_CARE_PROVIDER_SITE_OTHER): Payer: Self-pay | Admitting: Family Medicine

## 2021-03-25 ENCOUNTER — Other Ambulatory Visit: Payer: Self-pay

## 2021-03-25 ENCOUNTER — Ambulatory Visit (INDEPENDENT_AMBULATORY_CARE_PROVIDER_SITE_OTHER): Payer: Medicare Other | Admitting: Family Medicine

## 2021-03-25 VITALS — BP 121/71 | HR 72 | Temp 97.7°F | Ht 61.0 in | Wt 338.0 lb

## 2021-03-25 DIAGNOSIS — E559 Vitamin D deficiency, unspecified: Secondary | ICD-10-CM

## 2021-03-25 DIAGNOSIS — Z6841 Body Mass Index (BMI) 40.0 and over, adult: Secondary | ICD-10-CM

## 2021-03-25 DIAGNOSIS — I10 Essential (primary) hypertension: Secondary | ICD-10-CM

## 2021-03-25 DIAGNOSIS — E7849 Other hyperlipidemia: Secondary | ICD-10-CM

## 2021-03-25 DIAGNOSIS — R7303 Prediabetes: Secondary | ICD-10-CM

## 2021-03-25 NOTE — Progress Notes (Signed)
Chief Complaint:   OBESITY Maria Stone is here to discuss her progress with her obesity treatment plan along with follow-up of her obesity related diagnoses. Maria Stone is on the Category 4 Plan and states she is following her eating plan approximately 90-93% of the time. Maria Stone states she is is doing chair workout, and more activity for 15 minutes 3 times per week.  Today's visit was #: 53 Starting weight: 393 lbs Starting date: 12/31/2019 Today's weight: 338 lbs Today's date: 03/25/2021 Total lbs lost to date: 68 Total lbs lost since last in-office visit: 4  Interim History: Maria Stone continues to do well with weight loss. She is following her plan and she has increased activity. She is dealing with the stress of 2 close friends dying, but she has worked to avoid emotional eating behaviors.  Subjective:   1. Essential hypertension Tuesday is stable on lisinopril, and she is working on diet and weight loss.  2. Other hyperlipidemia Maria Stone is due to have her cholesterol checked. She is working on diet and weight loss.  3. Vitamin D deficiency Maria Stone is on Vit D, and she is due to have labs checked.  4. Pre-diabetes Maria Stone is working on weight loss, and she is doing very well with diet and with decreased polyphagia.  Assessment/Plan:   1. Essential hypertension Maria Stone will continue with diet and exercise to improve blood pressure control. We will check labs today, and will watch for signs of hypotension as she continues her lifestyle modifications.  2. Other hyperlipidemia Cardiovascular risk and specific lipid/LDL goals reviewed. We discussed several lifestyle modifications today. We will check labs today. Maria Stone will continue to work on diet, exercise and weight loss efforts. Orders and follow up as documented in patient record.   - Lipid Panel With LDL/HDL Ratio  3. Vitamin D deficiency Low Vitamin D level contributes to fatigue and are associated with obesity,  breast, and colon cancer. We will check labs today. Maria Stone will follow-up for routine testing of Vitamin D, at least 2-3 times per year to avoid over-replacement.  - VITAMIN D 25 Hydroxy (Vit-D Deficiency, Fractures)  4. Pre-diabetes Maria Stone will continue to work on weight loss, exercise, and decreasing simple carbohydrates to help decrease the risk of diabetes. We will check labs today.  - CMP14+EGFR - CBC with Differential/Platelet - Hemoglobin A1c - Insulin, random - Magnesium  5. Obesity with current BMI 64.0 Maria Stone is currently in the action stage of change. As such, her goal is to continue with weight loss efforts. She has agreed to the Category 4 Plan.   Exercise goals: As is.  Behavioral modification strategies: increasing lean protein intake.  Maria Stone has agreed to follow-up with our clinic in 4 weeks. She was informed of the importance of frequent follow-up visits to maximize her success with intensive lifestyle modifications for her multiple health conditions.   Maria Stone was informed we would discuss her lab results at her next visit unless there is a critical issue that needs to be addressed sooner. Maria Stone agreed to keep her next visit at the agreed upon time to discuss these results.  Objective:   Blood pressure 121/71, pulse 72, temperature 97.7 F (36.5 C), height $RemoveBe'5\' 1"'nzKpqprVC$  (1.549 m), weight (!) 338 lb (153.3 kg), SpO2 97 %. Body mass index is 63.86 kg/m.  General: Cooperative, alert, well developed, in no acute distress. HEENT: Conjunctivae and lids unremarkable. Cardiovascular: Regular rhythm.  Lungs: Normal work of breathing. Neurologic: No focal deficits.   Lab Results  Component Value Date   CREATININE 1.07 (H) 10/15/2020   BUN 28 (H) 10/15/2020   NA 142 10/15/2020   K 5.1 10/15/2020   CL 104 10/15/2020   CO2 25 10/15/2020   Lab Results  Component Value Date   ALT 14 10/15/2020   AST 18 10/15/2020   ALKPHOS 95 10/15/2020   BILITOT 0.5  10/15/2020   Lab Results  Component Value Date   HGBA1C 5.4 08/27/2020   HGBA1C 5.1 03/05/2020   HGBA1C 5.3 09/26/2019   HGBA1C 5.4 12/30/2018   HGBA1C (L) 03/08/2008    3.6 (NOTE)   The ADA recommends the following therapeutic goals for glycemic   control related to Hgb A1C measurement:   Goal of Therapy:   < 7.0% Hgb A1C   Action Suggested:  > 8.0% Hgb A1C   Ref:  Diabetes Care, 22, Suppl. 1, 1999   Lab Results  Component Value Date   INSULIN 17.1 08/27/2020   INSULIN 12.3 03/05/2020   INSULIN 12.3 09/26/2019   INSULIN 16.9 12/30/2018   Lab Results  Component Value Date   TSH 1.290 03/05/2020   Lab Results  Component Value Date   CHOL 246 (H) 08/27/2020   HDL 44 08/27/2020   LDLCALC 177 (H) 08/27/2020   TRIG 137 08/27/2020   CHOLHDL 7.3 03/07/2008   Lab Results  Component Value Date   WBC 6.3 08/27/2020   HGB 14.1 08/27/2020   HCT 42.2 08/27/2020   MCV 95 08/27/2020   PLT 198 08/27/2020   Lab Results  Component Value Date   IRON 76 07/21/2008   TIBC 295 07/21/2008   FERRITIN 219 07/21/2008    Obesity Behavioral Intervention:   Approximately 15 minutes were spent on the discussion below.  ASK: We discussed the diagnosis of obesity with Maria Stone today and Maria Stone agreed to give [name] permission to discuss obesity behavioral modification therapy today.  ASSESS: Maria Stone has the diagnosis of obesity and her BMI today is 63.9. Maria Stone is in the action stage of change.   ADVISE: Maria Stone was educated on the multiple health risks of obesity as well as the benefit of weight loss to improve her health. She was advised of the need for long term treatment and the importance of lifestyle modifications to improve her current health and to decrease her risk of future health problems.  AGREE: Multiple dietary modification options and treatment options were discussed and Maria Stone agreed to follow the recommendations documented in the above note.  ARRANGE: Maria Stone was  educated on the importance of frequent visits to treat obesity as outlined per CMS and USPSTF guidelines and agreed to schedule her next follow up appointment today.  Attestation Statements:   Reviewed by clinician on day of visit: allergies, medications, problem list, medical history, surgical history, family history, social history, and previous encounter notes.   I, Trixie Dredge, am acting as transcriptionist for Dennard Nip, MD.  I have reviewed the above documentation for accuracy and completeness, and I agree with the above. -  Dennard Nip, MD

## 2021-03-26 LAB — CMP14+EGFR
ALT: 15 IU/L (ref 0–32)
AST: 22 IU/L (ref 0–40)
Albumin/Globulin Ratio: 1.8 (ref 1.2–2.2)
Albumin: 4.2 g/dL (ref 3.8–4.8)
Alkaline Phosphatase: 104 IU/L (ref 44–121)
BUN/Creatinine Ratio: 25 (ref 12–28)
BUN: 28 mg/dL — ABNORMAL HIGH (ref 8–27)
Bilirubin Total: 0.7 mg/dL (ref 0.0–1.2)
CO2: 23 mmol/L (ref 20–29)
Calcium: 9.8 mg/dL (ref 8.7–10.3)
Chloride: 102 mmol/L (ref 96–106)
Creatinine, Ser: 1.11 mg/dL — ABNORMAL HIGH (ref 0.57–1.00)
Globulin, Total: 2.3 g/dL (ref 1.5–4.5)
Glucose: 94 mg/dL (ref 65–99)
Potassium: 4.8 mmol/L (ref 3.5–5.2)
Sodium: 142 mmol/L (ref 134–144)
Total Protein: 6.5 g/dL (ref 6.0–8.5)
eGFR: 55 mL/min/{1.73_m2} — ABNORMAL LOW (ref >59)

## 2021-03-26 LAB — CBC WITH DIFFERENTIAL/PLATELET
Basophils Absolute: 0.1 10*3/uL (ref 0.0–0.2)
Basos: 1 %
EOS (ABSOLUTE): 0.3 10*3/uL (ref 0.0–0.4)
Eos: 5 %
Hematocrit: 43.9 % (ref 34.0–46.6)
Hemoglobin: 14.4 g/dL (ref 11.1–15.9)
Immature Grans (Abs): 0 10*3/uL (ref 0.0–0.1)
Immature Granulocytes: 0 %
Lymphocytes Absolute: 1.2 10*3/uL (ref 0.7–3.1)
Lymphs: 20 %
MCH: 30.8 pg (ref 26.6–33.0)
MCHC: 32.8 g/dL (ref 31.5–35.7)
MCV: 94 fL (ref 79–97)
Monocytes Absolute: 0.5 10*3/uL (ref 0.1–0.9)
Monocytes: 8 %
Neutrophils Absolute: 3.7 10*3/uL (ref 1.4–7.0)
Neutrophils: 66 %
Platelets: 163 10*3/uL (ref 150–450)
RBC: 4.68 x10E6/uL (ref 3.77–5.28)
RDW: 11.8 % (ref 11.7–15.4)
WBC: 5.7 10*3/uL (ref 3.4–10.8)

## 2021-03-26 LAB — LIPID PANEL WITH LDL/HDL RATIO
Cholesterol, Total: 266 mg/dL — ABNORMAL HIGH (ref 100–199)
HDL: 52 mg/dL (ref >39)
LDL Chol Calc (NIH): 191 mg/dL — ABNORMAL HIGH (ref 0–99)
LDL/HDL Ratio: 3.7 ratio — ABNORMAL HIGH (ref 0.0–3.2)
Triglycerides: 126 mg/dL (ref 0–149)
VLDL Cholesterol Cal: 23 mg/dL (ref 5–40)

## 2021-03-26 LAB — INSULIN, RANDOM: INSULIN: 10 u[IU]/mL (ref 2.6–24.9)

## 2021-03-26 LAB — MAGNESIUM: Magnesium: 2.3 mg/dL (ref 1.6–2.3)

## 2021-03-26 LAB — HEMOGLOBIN A1C
Est. average glucose Bld gHb Est-mCnc: 105 mg/dL
Hgb A1c MFr Bld: 5.3 % (ref 4.8–5.6)

## 2021-03-26 LAB — VITAMIN D 25 HYDROXY (VIT D DEFICIENCY, FRACTURES): Vit D, 25-Hydroxy: 42.8 ng/mL (ref 30.0–100.0)

## 2021-04-11 ENCOUNTER — Other Ambulatory Visit (INDEPENDENT_AMBULATORY_CARE_PROVIDER_SITE_OTHER): Payer: Self-pay | Admitting: Family Medicine

## 2021-04-11 DIAGNOSIS — E559 Vitamin D deficiency, unspecified: Secondary | ICD-10-CM

## 2021-04-11 NOTE — Telephone Encounter (Signed)
Ok x 90 day, no refill

## 2021-04-11 NOTE — Telephone Encounter (Signed)
Patient is requesting a refill of the following medications: Requested Prescriptions   Pending Prescriptions Disp Refills   Vitamin D, Ergocalciferol, (DRISDOL) 1.25 MG (50000 UNIT) CAPS capsule [Pharmacy Med Name: Vitamin D (Ergocalciferol) 1.25 MG (50000 UT) Oral Capsule] 12 capsule 3    Sig: TAKE 1 CAPSULE BY MOUTH 1  TIME EVERY 7 DAYS     Last office visit: 03/25/21 Date of last refill: 01/28/21 Last refill amount: 12 Follow up time period per chart:4 week Next scheduled appt: 04/22/21

## 2021-04-13 ENCOUNTER — Other Ambulatory Visit (INDEPENDENT_AMBULATORY_CARE_PROVIDER_SITE_OTHER): Payer: Self-pay | Admitting: Family Medicine

## 2021-04-13 DIAGNOSIS — R600 Localized edema: Secondary | ICD-10-CM

## 2021-04-14 ENCOUNTER — Emergency Department (HOSPITAL_COMMUNITY)
Admission: EM | Admit: 2021-04-14 | Discharge: 2021-04-14 | Disposition: A | Discharge status: Home / Self Care | Payer: Medicare Other | Attending: Emergency Medicine | Admitting: Emergency Medicine

## 2021-04-14 ENCOUNTER — Emergency Department (HOSPITAL_COMMUNITY): Payer: Medicare Other

## 2021-04-14 ENCOUNTER — Encounter (HOSPITAL_COMMUNITY): Payer: Self-pay | Admitting: Emergency Medicine

## 2021-04-14 ENCOUNTER — Other Ambulatory Visit: Payer: Self-pay

## 2021-04-14 DIAGNOSIS — J45909 Unspecified asthma, uncomplicated: Secondary | ICD-10-CM | POA: Diagnosis not present

## 2021-04-14 DIAGNOSIS — S86911A Strain of unspecified muscle(s) and tendon(s) at lower leg level, right leg, initial encounter: Secondary | ICD-10-CM | POA: Diagnosis not present

## 2021-04-14 DIAGNOSIS — Z79899 Other long term (current) drug therapy: Secondary | ICD-10-CM | POA: Diagnosis not present

## 2021-04-14 DIAGNOSIS — Y9222 Religious institution as the place of occurrence of the external cause: Secondary | ICD-10-CM | POA: Insufficient documentation

## 2021-04-14 DIAGNOSIS — I10 Essential (primary) hypertension: Secondary | ICD-10-CM | POA: Diagnosis not present

## 2021-04-14 DIAGNOSIS — W010XXA Fall on same level from slipping, tripping and stumbling without subsequent striking against object, initial encounter: Secondary | ICD-10-CM | POA: Insufficient documentation

## 2021-04-14 DIAGNOSIS — Z87891 Personal history of nicotine dependence: Secondary | ICD-10-CM | POA: Diagnosis not present

## 2021-04-14 DIAGNOSIS — Y9301 Activity, walking, marching and hiking: Secondary | ICD-10-CM | POA: Insufficient documentation

## 2021-04-14 DIAGNOSIS — S8991XA Unspecified injury of right lower leg, initial encounter: Secondary | ICD-10-CM | POA: Diagnosis present

## 2021-04-14 MED ORDER — OXYCODONE-ACETAMINOPHEN 5-325 MG PO TABS
1.0000 | ORAL_TABLET | Freq: Once | ORAL | Status: AC
  Administered 2021-04-14: 1 via ORAL
  Filled 2021-04-14: qty 1

## 2021-04-14 MED ORDER — OXYCODONE-ACETAMINOPHEN 5-325 MG PO TABS: 1.0000 | ORAL_TABLET | Freq: Four times a day (QID) | ORAL | 0 refills | Status: AC | PRN

## 2021-04-14 NOTE — ED Triage Notes (Signed)
Patient arrives POV after tripping while leaving church on rocks when her cane got caught in the rocks sending her forward. Patient complaining of mainly R knee pain. Pt able to move leg and was able to walk to baseline after incident. Pt AOx4. No LOC or blood thinner.

## 2021-04-14 NOTE — ED Notes (Signed)
Ice pack applied to knee

## 2021-04-14 NOTE — Discharge Instructions (Signed)
Follow-up with your orthopedic doctor.  Take Tylenol Motrin as needed for pain control.  For breakthrough pain can take the prescribed Percocet but no this can make you drowsy and stopped taking driving or operating any machinery.  If your pain worsens, you are unable to bear weight, he should come back to ER for additional testing.

## 2021-04-14 NOTE — ED Provider Notes (Signed)
[facility] EMERGENCY DEPARTMENT Provider Note   CSN: [id number] Arrival date & time: 04/14/21  1051     History Chief Complaint  Patient presents with  . Fall  . Leg Pain    Maria Stone is a 65 y.o. female.  Presented to ER with concern for knee pain.  Patient states that she tripped while walking [place] some [place] at [place].  Landed on her right knee.  Has been able to ambulate but having pain.  Pain is improved with rest.  Has not taken any medicine yet for it.  Currently moderate, throbbing.  No numbness or weakness.  Denies hitting head, denies any other injuries.  HPI     Past Medical History:  Diagnosis Date  . Anxiety   . Arthritis   . Asthma   . Chronic pain   . Chronic pain   . Family history of adverse reaction to anesthesia    PONV for pt mother  . Heart murmur   . Hemolytic anemia (HCC)   . Hyperlipidemia   . Hypertension   . Joint pain   . Knee pain, bilateral   . Lower extremity edema   . Mass    right middle finger  . Morbid obesity (HCC)   . Osteoarthritis   . Osteoporosis   . Perirectal abscess   . Venous stasis     Patient Active Problem List   Diagnosis Date Noted  . Vitamin D deficiency 05/04/2019  . Perirectal abscess 05/21/2012    Class: Acute  . Urinary tract infection 05/21/2012    Class: Acute  . Hypotension 05/21/2012    Class: Acute  . Class 3 severe obesity with serious comorbidity and body mass index (BMI) greater than or equal to 70 in adult [facility] 05/21/2012    Class: Acute  . Nausea and vomiting 05/21/2012    Class: Acute  . Essential hypertension 05/21/2012    Class: Chronic  . Osteoporosis 05/21/2012    Class: Chronic  . Hyperlipidemia 05/21/2012    Class: Chronic  . Osteoarthritis of both knees 05/21/2012    Class: Chronic  . Anemia, hemolytic (HCC) 05/21/2012    Class: Chronic  . Asthma 05/21/2012    Class: Chronic  . Chronic venous insufficiency 05/21/2012    Class: Chronic     Past Surgical History:  Procedure Laterality Date  . COLONOSCOPY    . INCISION AND DRAINAGE PERIRECTAL ABSCESS  05/25/2012   Procedure: IRRIGATION AND DEBRIDEMENT PERIRECTAL ABSCESS;  Surgeon: Valarie Merino, MD;  Location: [facility] ORS;  Service: General;  Laterality: N/A;  . MASS EXCISION Right 01/01/2018   Procedure: EXCISION MASS RIGHT MIDDLE FINGER;  Surgeon: Cindee Salt, MD;  Location: [facility] OR;  Service: Orthopedics;  Laterality: Right;     OB History    Gravida  1   Para      Term      Preterm      AB      Living  1     SAB      IAB      Ectopic      Multiple      Live Births              Family History  Problem Relation Age of Onset  . Diabetes Father   . Hyperlipidemia Father   . Hypertension Father   . Heart disease Father   . Kidney disease Father   . Obesity Father   .  Diabetes Other     Social History   Tobacco Use  . Smoking status: Former Smoker    Types: Cigarettes  . Smokeless tobacco: Never Used  . Tobacco comment: 07/01/1976  Vaping Use  . Vaping Use: Never used  Substance Use Topics  . Alcohol use: No  . Drug use: No    Home Medications Prior to Admission medications   Medication Sig Start Date End Date Taking? Authorizing Provider  oxyCODONE-acetaminophen (PERCOCET/ROXICET) 5-325 MG tablet Take 1 tablet by mouth every 6 (six) hours as needed for up to 3 days for severe pain. 04/14/21 04/17/21 Yes Haneefah Venturini, Quitman Livings, MD  acetaminophen (TYLENOL) 500 MG tablet Take 1,000 mg by mouth every 6 (six) hours as needed for moderate pain or headache.     [provider]  allopurinol (ZYLOPRIM) 100 MG tablet Take 100 mg by mouth daily.    [provider]  ASHWAGANDHA PO Take by mouth.    [provider]  Coenzyme Q10 (EQL COQ10) 400 MG CAPS Take 1 capsule by mouth daily. Take 1 capsule by mouth once daily.    [provider]  FLUoxetine (PROZAC) 20 MG tablet Take 20 mg by mouth daily.    [provider]  folic acid (FOLVITE) 1 MG tablet Take 1 mg by mouth daily.    [provider]  furosemide (LASIX) 40 MG tablet TAKE ONE-HALF TABLET BY  MOUTH DAILY 01/24/21   Quillian Quince D, MD  gabapentin (NEURONTIN) 300 MG capsule Take 300 mg by mouth 3 (three) times daily.    [provider]  lisinopril (ZESTRIL) 10 MG tablet Take 1 tablet (10 mg total) by mouth daily. 07/11/20   Quillian Quince D, MD  loratadine (CLARITIN) 10 MG tablet Take 10 mg by mouth daily.    [provider]  Multiple Vitamin (MULTIVITAMIN WITH MINERALS) TABS Take 1 tablet by mouth daily.    [provider]  Multiple Vitamins-Minerals (AIRBORNE GUMMIES PO) Take 1 each by mouth daily.    [provider]  NON FORMULARY Standard Process E-Z Mg    [provider]  PROAIR HFA 108 (90 Base) MCG/ACT inhaler Inhale 2 puffs into the lungs every 6 (six) hours as needed for shortness of breath or wheezing. 10/05/17   [provider]  Vitamin D, Ergocalciferol, (DRISDOL) 1.25 MG (50000 UNIT) CAPS capsule TAKE 1 CAPSULE BY MOUTH 1  TIME EVERY 7 DAYS 04/11/21   Quillian Quince D, MD    Allergies    Nsaids, Tolmetin, Contrast media [iodinated diagnostic agents], Penicillins, and Codeine  Review of Systems   Review of Systems  Musculoskeletal: Positive for arthralgias.  All other systems reviewed and are negative.   Physical Exam Updated Vital Signs BP (!) 117/54   Pulse (!) 57   Temp 98.2 F (36.8 C) (Oral)   Resp 16   Ht 5\' 1"  (1.549 m)   Wt (!) 153 kg   SpO2 100%   BMI 63.73 kg/m   Physical Exam Vitals and nursing note reviewed.  Constitutional:      General: She is not in acute distress.    Appearance: She is well-developed.  HENT:     Head: Normocephalic and atraumatic.  Eyes:     Conjunctiva/sclera: Conjunctivae normal.  Cardiovascular:     Rate and Rhythm: Normal rate and regular rhythm.     Heart sounds: No murmur heard.   Pulmonary:     Effort:  Pulmonary effort is normal. No respiratory  distress.  Musculoskeletal:     Cervical back: Neck supple.     Comments: Right lower extremity some tenderness to palpation noted in the right knee, no deformity appreciated, no significant swelling, range of motion normal, distal pulses and sensation intact  Skin:    General: Skin is warm and dry.  Neurological:     General: No focal deficit present.     Mental Status: She is alert.  Psychiatric:        Mood and Affect: Mood normal.        Behavior: Behavior normal.     ED Results / Procedures / Treatments   Labs (all labs ordered are listed, but only abnormal results are displayed) Labs Reviewed - No data to display  EKG None  Radiology DG Knee Complete 4 Views Right  Result Date: 04/14/2021 CLINICAL DATA:  Pain following fall EXAM: RIGHT KNEE - COMPLETE 4+ VIEW COMPARISON:  None. FINDINGS: Frontal, lateral, and bilateral oblique views were obtained. No evident fracture or dislocation. No joint effusion. There is marked joint space narrowing medially with moderately severe narrowing of the patellofemoral joint. There is spurring in all compartments. No erosive changes are evident. IMPRESSION: Extensive osteoarthritic change medially and in the patellofemoral joint regions. Spurring in all compartments. No fracture, dislocation, or joint effusion. Electronically Signed   By: Bretta Bang III M.D.   On: 04/14/2021 11:34    Procedures Procedures   Medications Ordered in ED Medications  oxyCODONE-acetaminophen (PERCOCET/ROXICET) 5-325 MG per tablet 1 tablet (1 tablet Oral Given 04/14/21 1308)    ED Course  I have reviewed the triage vital signs and the nursing notes.  Pertinent labs & imaging results that were available during my care of the patient were reviewed by me and considered in my medical decision making (see chart for details).    MDM Rules/Calculators/A&P                         65 year old lady presenting to ER with  concern for right knee pain after mechanical fall.  On exam noted some tenderness over the knee but no significant deformity was appreciated.  Plain films were negative.  Patient is able to bear weight.  Suspect MSK strain.  Recommend follow-up with Ortho outpatient.  Discharged home.  After the discussed management above, the patient was determined to be safe for discharge.  The patient was in agreement with this plan and all questions regarding their care were answered.  ED return precautions were discussed and the patient will return to the ED with any significant worsening of condition.    Final Clinical Impression(s) / ED Diagnoses Final diagnoses:  Strain of right knee, initial encounter    Rx / DC Orders ED Discharge Orders         Ordered    oxyCODONE-acetaminophen (PERCOCET/ROXICET) 5-325 MG tablet  Every 6 hours PRN        04/14/21 1329           Milagros Loll, MD 04/15/21 1747

## 2021-04-15 NOTE — Telephone Encounter (Signed)
Pt last seen by Dr. Beasley.  

## 2021-04-15 NOTE — Telephone Encounter (Signed)
Patient is requesting a refill of the following medications: Requested Prescriptions   Pending Prescriptions Disp Refills   furosemide (LASIX) 40 MG tablet [Pharmacy Med Name: Furosemide 40 MG Oral Tablet] 45 tablet 3    Sig: TAKE ONE-HALF TABLET BY  MOUTH DAILY     Last office visit: 03/25/21 Date of last refill: 01/24/21 Last refill amount: 45 Follow up time period per chart: 4 week Next appt scheduled: 04/22/21

## 2021-04-22 ENCOUNTER — Ambulatory Visit (INDEPENDENT_AMBULATORY_CARE_PROVIDER_SITE_OTHER): Payer: Medicare Other | Admitting: Family Medicine

## 2021-05-01 ENCOUNTER — Ambulatory Visit (INDEPENDENT_AMBULATORY_CARE_PROVIDER_SITE_OTHER): Payer: Medicare Other | Admitting: Family Medicine

## 2021-05-01 ENCOUNTER — Encounter (INDEPENDENT_AMBULATORY_CARE_PROVIDER_SITE_OTHER): Payer: Self-pay | Admitting: Family Medicine

## 2021-05-01 ENCOUNTER — Other Ambulatory Visit: Payer: Self-pay

## 2021-05-01 VITALS — BP 140/69 | HR 77 | Temp 98.3°F | Ht 61.0 in | Wt 339.0 lb

## 2021-05-01 DIAGNOSIS — I10 Essential (primary) hypertension: Secondary | ICD-10-CM | POA: Diagnosis not present

## 2021-05-01 DIAGNOSIS — E662 Morbid (severe) obesity with alveolar hypoventilation: Secondary | ICD-10-CM

## 2021-05-01 DIAGNOSIS — Z6841 Body Mass Index (BMI) 40.0 and over, adult: Secondary | ICD-10-CM | POA: Diagnosis not present

## 2021-05-01 DIAGNOSIS — E559 Vitamin D deficiency, unspecified: Secondary | ICD-10-CM | POA: Diagnosis not present

## 2021-05-01 DIAGNOSIS — R600 Localized edema: Secondary | ICD-10-CM | POA: Diagnosis not present

## 2021-05-01 MED ORDER — FUROSEMIDE 40 MG PO TABS: 20.0000 mg | ORAL_TABLET | Freq: Every day | ORAL | 0 refills | Status: DC

## 2021-05-01 MED ORDER — VITAMIN D (ERGOCALCIFEROL) 1.25 MG (50000 UNIT) PO CAPS: ORAL_CAPSULE | ORAL | 0 refills | Status: DC

## 2021-05-01 MED ORDER — LISINOPRIL 10 MG PO TABS: 10.0000 mg | ORAL_TABLET | Freq: Every day | ORAL | 0 refills | Status: DC

## 2021-05-15 ENCOUNTER — Other Ambulatory Visit: Payer: Self-pay | Admitting: Internal Medicine

## 2021-05-15 DIAGNOSIS — E785 Hyperlipidemia, unspecified: Secondary | ICD-10-CM

## 2021-05-20 NOTE — Progress Notes (Signed)
Chief Complaint:   OBESITY Maria Stone is here to discuss her progress with her obesity treatment plan along with follow-up of her obesity related diagnoses. Maria Stone is on the Category 4 Plan and states she is following her eating plan approximately 85-90% of the time. Maria Stone states she is doing 0 minutes 0 times per week.  Today's visit was #: 36 Starting weight: 393 lbs Starting date: 12/31/2019 Today's weight: 339 lbs Today's date: 05/01/2021 Total lbs lost to date: 54 Total lbs lost since last in-office visit: 0  Interim History: Maria Stone has struggled in the last 2-3 weeks due to a recent fall and significantly worsened knee pain. She continues to work on weight loss, but activity is limited.  Subjective:   1. Essential hypertension Maria Stone's blood pressure is higher today than normal, but she has increased pain. She continues to work on diet and weight loss.  2. Vitamin D deficiency Maria Stone is stable on Vit D, and she requests a refill today. She denies signs of over-replacement.  3. Bilateral lower extremity edema Maria Stone has chronic bilateral lower extremity edema, and she takes Lasix as needed.  Assessment/Plan:   1. Essential hypertension Maria Stone will continue diet and exercise to improve blood pressure control. We will watch for signs of hypotension as she continues her lifestyle modifications. We will refill lisinopril for 1 month.  - lisinopril (ZESTRIL) 10 MG tablet; Take 1 tablet (10 mg total) by mouth daily.  Dispense: 30 tablet; Refill: 0  2. Vitamin D deficiency Low Vitamin D level contributes to fatigue and are associated with obesity, breast, and colon cancer. We will refill prescription Vitamin D for 90 days with no refills. Maria Stone will follow-up for routine testing of Vitamin D, at least 2-3 times per year to avoid over-replacement.  - Vitamin D, Ergocalciferol, (DRISDOL) 1.25 MG (50000 UNIT) CAPS capsule; TAKE 1 CAPSULE BY MOUTH 1  TIME EVERY 7  DAYS  Dispense: 12 capsule; Refill: 0  3. Bilateral lower extremity edema We will refill Lasix for 1 month. Maria Stone will continue to follow up as directed. - furosemide (LASIX) 40 MG tablet; Take 0.5 tablets (20 mg total) by mouth daily.  Dispense: 15 tablet; Refill: 0  4. Obesity with current BMI 64.1 Maria Stone is currently in the action stage of change. As such, her goal is to continue with weight loss efforts. She has agreed to the Category 4 Plan.   Exercise goals: All adults should avoid inactivity. Some physical activity is better than none, and adults who participate in any amount of physical activity gain some health benefits.  Behavioral modification strategies: no skipping meals and meal planning and cooking strategies.  Maria Stone has agreed to follow-up with our clinic in 3 weeks. She was informed of the importance of frequent follow-up visits to maximize her success with intensive lifestyle modifications for her multiple health conditions.   Objective:   Blood pressure 140/69, pulse 77, temperature 98.3 F (36.8 C), height 5\' 1"  (1.549 m), weight (!) 339 lb (153.8 kg), SpO2 96 %. Body mass index is 64.05 kg/m.  General: Cooperative, alert, well developed, in no acute distress. HEENT: Conjunctivae and lids unremarkable. Cardiovascular: Regular rhythm.  Lungs: Normal work of breathing. Neurologic: No focal deficits.   Lab Results  Component Value Date   CREATININE 1.11 (H) 03/25/2021   BUN 28 (H) 03/25/2021   NA 142 03/25/2021   K 4.8 03/25/2021   CL 102 03/25/2021   CO2 23 03/25/2021   Lab Results  Component  Value Date   ALT 15 03/25/2021   AST 22 03/25/2021   ALKPHOS 104 03/25/2021   BILITOT 0.7 03/25/2021   Lab Results  Component Value Date   HGBA1C 5.3 03/25/2021   HGBA1C 5.4 08/27/2020   HGBA1C 5.1 03/05/2020   HGBA1C 5.3 09/26/2019   HGBA1C 5.4 12/30/2018   Lab Results  Component Value Date   INSULIN 10.0 03/25/2021   INSULIN 17.1 08/27/2020    INSULIN 12.3 03/05/2020   INSULIN 12.3 09/26/2019   INSULIN 16.9 12/30/2018   Lab Results  Component Value Date   TSH 1.290 03/05/2020   Lab Results  Component Value Date   CHOL 266 (H) 03/25/2021   HDL 52 03/25/2021   LDLCALC 191 (H) 03/25/2021   TRIG 126 03/25/2021   CHOLHDL 7.3 03/07/2008   Lab Results  Component Value Date   VD25OH 42.8 03/25/2021   VD25OH 49.1 08/27/2020   VD25OH 47.0 03/05/2020   Lab Results  Component Value Date   WBC 5.7 03/25/2021   HGB 14.4 03/25/2021   HCT 43.9 03/25/2021   MCV 94 03/25/2021   PLT 163 03/25/2021   Lab Results  Component Value Date   IRON 76 07/21/2008   TIBC 295 07/21/2008   FERRITIN 219 07/21/2008    Obesity Behavioral Intervention:   Approximately 15 minutes were spent on the discussion below.  ASK: We discussed the diagnosis of obesity with Maria Stone today and Maria Stone agreed to give [name] permission to discuss obesity behavioral modification therapy today.  ASSESS: Maria Stone has the diagnosis of obesity and her BMI today is 64.09. Maria Stone is in the action stage of change.   ADVISE: Maria Stone was educated on the multiple health risks of obesity as well as the benefit of weight loss to improve her health. She was advised of the need for long term treatment and the importance of lifestyle modifications to improve her current health and to decrease her risk of future health problems.  AGREE: Multiple dietary modification options and treatment options were discussed and Maria Stone agreed to follow the recommendations documented in the above note.  ARRANGE: Maria Stone was educated on the importance of frequent visits to treat obesity as outlined per CMS and USPSTF guidelines and agreed to schedule her next follow up appointment today.  Attestation Statements:   Reviewed by clinician on day of visit: allergies, medications, problem list, medical history, surgical history, family history, social history, and previous encounter  notes.   I, Burt Knack, am acting as transcriptionist for Quillian Quince, MD.  I have reviewed the above documentation for accuracy and completeness, and I agree with the above. -

## 2021-05-23 ENCOUNTER — Other Ambulatory Visit (INDEPENDENT_AMBULATORY_CARE_PROVIDER_SITE_OTHER): Payer: Self-pay | Admitting: Family Medicine

## 2021-05-23 DIAGNOSIS — R600 Localized edema: Secondary | ICD-10-CM

## 2021-05-23 NOTE — Telephone Encounter (Signed)
LAST APPOINTMENT DATE: 05/01/2021  NEXT APPOINTMENT DATE: 05/30/2021   [id number] [place], [place] - [address] [place] [address] Phone: [phone] Fax: [phone]  [facility]  [facility]) - [place], [place] - [address] [place] [address] Phone: [phone] Fax: [phone]  Patient is requesting a refill of the following medications: Requested Prescriptions   Pending Prescriptions Disp Refills   furosemide (LASIX) 40 MG tablet [Pharmacy Med Name: FUROSEMIDE 40 MG TABLET] 15 tablet 0    Sig: TAKE 1/2 TABLET BY MOUTH DAILY    Date last filled: 05/01/21 Previously prescribed by [facility]  Lab Results  Component Value Date   HGBA1C 5.3 03/25/2021   HGBA1C 5.4 08/27/2020   HGBA1C 5.1 03/05/2020   Lab Results  Component Value Date   LDLCALC 191 (H) 03/25/2021   CREATININE 1.11 (H) 03/25/2021   Lab Results  Component Value Date   VD25OH 42.8 03/25/2021   VD25OH 49.1 08/27/2020   VD25OH 47.0 03/05/2020    BP Readings from Last 3 Encounters:  05/01/21 140/69  04/14/21 (!) 117/54  03/25/21 121/71

## 2021-05-23 NOTE — Telephone Encounter (Signed)
Last OV with Dr. Beasley 

## 2021-05-26 ENCOUNTER — Other Ambulatory Visit (INDEPENDENT_AMBULATORY_CARE_PROVIDER_SITE_OTHER): Payer: Self-pay | Admitting: Family Medicine

## 2021-05-26 DIAGNOSIS — R600 Localized edema: Secondary | ICD-10-CM

## 2021-05-27 NOTE — Telephone Encounter (Signed)
LAST APPOINTMENT DATE: 05/23/2021  NEXT APPOINTMENT DATE: 05/30/2021   [id number] - [place], [place] - [address] [place] [address] Phone: [phone] Fax: [phone]  [facility]  [facility] Delivery) - [place], [place] - [address] [place] [address] Phone: [phone] Fax: [phone]  Patient is requesting a refill of the following medications: Requested Prescriptions   Pending Prescriptions Disp Refills   furosemide (LASIX) 40 MG tablet [Pharmacy Med Name: Furosemide 40 MG Oral Tablet] 15 tablet 11    Sig: TAKE ONE-HALF TABLET BY  MOUTH DAILY    Date last filled: 05/01/21 Previously prescribed by [facility]  Lab Results  Component Value Date   HGBA1C 5.3 03/25/2021   HGBA1C 5.4 08/27/2020   HGBA1C 5.1 03/05/2020   Lab Results  Component Value Date   LDLCALC 191 (H) 03/25/2021   CREATININE 1.11 (H) 03/25/2021   Lab Results  Component Value Date   VD25OH 42.8 03/25/2021   VD25OH 49.1 08/27/2020   VD25OH 47.0 03/05/2020    BP Readings from Last 3 Encounters:  05/01/21 140/69  04/14/21 (!) 117/54  03/25/21 121/71

## 2021-05-29 ENCOUNTER — Other Ambulatory Visit (INDEPENDENT_AMBULATORY_CARE_PROVIDER_SITE_OTHER): Payer: Self-pay | Admitting: Family Medicine

## 2021-05-29 DIAGNOSIS — R600 Localized edema: Secondary | ICD-10-CM

## 2021-05-29 NOTE — Telephone Encounter (Signed)
Dr.Beasley 

## 2021-05-30 ENCOUNTER — Encounter (INDEPENDENT_AMBULATORY_CARE_PROVIDER_SITE_OTHER): Payer: Self-pay | Admitting: Emergency Medicine

## 2021-05-30 ENCOUNTER — Ambulatory Visit (INDEPENDENT_AMBULATORY_CARE_PROVIDER_SITE_OTHER): Payer: Medicare Other | Admitting: Family Medicine

## 2021-05-30 NOTE — Telephone Encounter (Signed)
Msg sent via mychart

## 2021-06-11 ENCOUNTER — Other Ambulatory Visit: Payer: Self-pay

## 2021-06-11 ENCOUNTER — Ambulatory Visit
Admission: RE | Admit: 2021-06-11 | Discharge: 2021-06-11 | Disposition: A | Discharge status: Home / Self Care | Payer: No Typology Code available for payment source | Source: Ambulatory Visit | Attending: Internal Medicine | Admitting: Internal Medicine

## 2021-06-11 DIAGNOSIS — E785 Hyperlipidemia, unspecified: Secondary | ICD-10-CM

## 2021-06-21 ENCOUNTER — Other Ambulatory Visit (HOSPITAL_COMMUNITY): Payer: Self-pay | Admitting: Internal Medicine

## 2021-06-21 DIAGNOSIS — I288 Other diseases of pulmonary vessels: Secondary | ICD-10-CM

## 2021-06-24 ENCOUNTER — Ambulatory Visit (INDEPENDENT_AMBULATORY_CARE_PROVIDER_SITE_OTHER): Payer: Medicare Other | Admitting: Family Medicine

## 2021-06-24 ENCOUNTER — Other Ambulatory Visit: Payer: Self-pay

## 2021-06-24 ENCOUNTER — Encounter (INDEPENDENT_AMBULATORY_CARE_PROVIDER_SITE_OTHER): Payer: Self-pay | Admitting: Family Medicine

## 2021-06-24 VITALS — BP 130/69 | HR 66 | Temp 98.4°F | Ht 61.0 in | Wt 343.0 lb

## 2021-06-24 DIAGNOSIS — E559 Vitamin D deficiency, unspecified: Secondary | ICD-10-CM

## 2021-06-24 DIAGNOSIS — R600 Localized edema: Secondary | ICD-10-CM

## 2021-06-24 DIAGNOSIS — I10 Essential (primary) hypertension: Secondary | ICD-10-CM

## 2021-06-24 DIAGNOSIS — Z6841 Body Mass Index (BMI) 40.0 and over, adult: Secondary | ICD-10-CM | POA: Diagnosis not present

## 2021-06-24 MED ORDER — VITAMIN D (ERGOCALCIFEROL) 1.25 MG (50000 UNIT) PO CAPS: ORAL_CAPSULE | ORAL | 0 refills | Status: DC

## 2021-06-24 MED ORDER — LISINOPRIL 10 MG PO TABS: 10.0000 mg | ORAL_TABLET | Freq: Every day | ORAL | 0 refills | Status: DC

## 2021-06-25 ENCOUNTER — Other Ambulatory Visit (INDEPENDENT_AMBULATORY_CARE_PROVIDER_SITE_OTHER): Payer: Self-pay | Admitting: Family Medicine

## 2021-06-25 DIAGNOSIS — E559 Vitamin D deficiency, unspecified: Secondary | ICD-10-CM

## 2021-06-25 MED ORDER — VITAMIN D (ERGOCALCIFEROL) 1.25 MG (50000 UNIT) PO CAPS: ORAL_CAPSULE | ORAL | 0 refills | Status: DC

## 2021-06-25 NOTE — Progress Notes (Signed)
Chief Complaint:   OBESITY Maria Stone is here to discuss her progress with her obesity treatment plan along with follow-up of her obesity related diagnoses. Maria Stone is on the Category 4 Plan and states she is following her eating plan approximately 50% of the time. Maria Stone states she is walking and taking the stairs for 60 minutes 7 times per week.  Today's visit was #: 37 Starting weight: 393 lbs Starting date: 12/31/2019 Today's weight: 343 lbs Today's date: 06/24/2021 Total lbs lost to date: 50 Total lbs lost since last in-office visit: 0  Interim History: Maria Stone has struggled more with meal planning. She has had to eat out more and she skipped some meals, but she is ready to get back on track.  Subjective:   1. Essential hypertension Maria Stone's blood pressure is well controlled on lisinopril and her diet. She has struggled a bit more with weight loss but she is ready to get back on track.  2. Vitamin D deficiency Maria Stone is stable on Vit D, and she requests a refill today.  Assessment/Plan:   1. Essential hypertension We will refill lisinopril for 1 month. Maria Stone will get back to diet and exercise to improve blood pressure control. We will watch for signs of hypotension as she continues her lifestyle modifications.  - lisinopril (ZESTRIL) 10 MG tablet; Take 1 tablet (10 mg total) by mouth daily.  Dispense: 30 tablet; Refill: 0  2. Vitamin D deficiency Low Vitamin D level contributes to fatigue and are associated with obesity, breast, and colon cancer. We will refill prescription Vitamin D for 1 month, and Maria Stone will follow-up for routine testing of Vitamin D, at least 2-3 times per year to avoid over-replacement.  - Vitamin D, Ergocalciferol, (DRISDOL) 1.25 MG (50000 UNIT) CAPS capsule; TAKE 1 CAPSULE BY MOUTH 1  TIME EVERY 7 DAYS  Dispense: 4 capsule; Refill: 0  3. Obesity with current BMI 64.8 Maria Stone is currently in the action stage of change. As such, her  goal is to continue with weight loss efforts. She has agreed to change to following a lower carbohydrate, vegetable and lean protein rich diet plan.   Exercise goals: As is.  Behavioral modification strategies: increasing lean protein intake.  Maria Stone has agreed to follow-up with our clinic in 3 weeks. She was informed of the importance of frequent follow-up visits to maximize her success with intensive lifestyle modifications for her multiple health conditions.   Objective:   Blood pressure 130/69, pulse 66, temperature 98.4 F (36.9 C), height 5\' 1"  (1.549 m), weight (!) 343 lb (155.6 kg), SpO2 98 %. Body mass index is 64.81 kg/m.  General: Cooperative, alert, well developed, in no acute distress. HEENT: Conjunctivae and lids unremarkable. Cardiovascular: Regular rhythm.  Lungs: Normal work of breathing. Neurologic: No focal deficits.   Lab Results  Component Value Date   CREATININE 1.11 (H) 03/25/2021   BUN 28 (H) 03/25/2021   NA 142 03/25/2021   K 4.8 03/25/2021   CL 102 03/25/2021   CO2 23 03/25/2021   Lab Results  Component Value Date   ALT 15 03/25/2021   AST 22 03/25/2021   ALKPHOS 104 03/25/2021   BILITOT 0.7 03/25/2021   Lab Results  Component Value Date   HGBA1C 5.3 03/25/2021   HGBA1C 5.4 08/27/2020   HGBA1C 5.1 03/05/2020   HGBA1C 5.3 09/26/2019   HGBA1C 5.4 12/30/2018   Lab Results  Component Value Date   INSULIN 10.0 03/25/2021   INSULIN 17.1 08/27/2020  INSULIN 12.3 03/05/2020   INSULIN 12.3 09/26/2019   INSULIN 16.9 12/30/2018   Lab Results  Component Value Date   TSH 1.290 03/05/2020   Lab Results  Component Value Date   CHOL 266 (H) 03/25/2021   HDL 52 03/25/2021   LDLCALC 191 (H) 03/25/2021   TRIG 126 03/25/2021   CHOLHDL 7.3 03/07/2008   Lab Results  Component Value Date   VD25OH 42.8 03/25/2021   VD25OH 49.1 08/27/2020   VD25OH 47.0 03/05/2020   Lab Results  Component Value Date   WBC 5.7 03/25/2021   HGB 14.4  03/25/2021   HCT 43.9 03/25/2021   MCV 94 03/25/2021   PLT 163 03/25/2021   Lab Results  Component Value Date   IRON 76 07/21/2008   TIBC 295 07/21/2008   FERRITIN 219 07/21/2008   Attestation Statements:   Reviewed by clinician on day of visit: allergies, medications, problem list, medical history, surgical history, family history, social history, and previous encounter notes.  Time spent on visit including pre-visit chart review and post-visit care and charting was 32 minutes.    I, Burt Knack, am acting as transcriptionist for Quillian Quince, MD.  I have reviewed the above documentation for accuracy and completeness, and I agree with the above. -  Quillian Quince, MD

## 2021-07-17 ENCOUNTER — Ambulatory Visit (HOSPITAL_COMMUNITY): Payer: Medicare Other | Attending: Internal Medicine

## 2021-07-17 ENCOUNTER — Other Ambulatory Visit: Payer: Self-pay

## 2021-07-17 DIAGNOSIS — I10 Essential (primary) hypertension: Secondary | ICD-10-CM | POA: Diagnosis not present

## 2021-07-17 DIAGNOSIS — I288 Other diseases of pulmonary vessels: Secondary | ICD-10-CM | POA: Diagnosis not present

## 2021-07-17 DIAGNOSIS — J45909 Unspecified asthma, uncomplicated: Secondary | ICD-10-CM | POA: Insufficient documentation

## 2021-07-17 DIAGNOSIS — E669 Obesity, unspecified: Secondary | ICD-10-CM | POA: Diagnosis not present

## 2021-07-17 DIAGNOSIS — E785 Hyperlipidemia, unspecified: Secondary | ICD-10-CM | POA: Diagnosis not present

## 2021-07-17 DIAGNOSIS — I959 Hypotension, unspecified: Secondary | ICD-10-CM | POA: Insufficient documentation

## 2021-07-17 LAB — ECHOCARDIOGRAM COMPLETE
Area-P 1/2: 3.65 cm2
S' Lateral: 3 cm

## 2021-07-22 ENCOUNTER — Other Ambulatory Visit: Payer: Self-pay

## 2021-07-22 ENCOUNTER — Ambulatory Visit (INDEPENDENT_AMBULATORY_CARE_PROVIDER_SITE_OTHER): Payer: Medicare Other | Admitting: Family Medicine

## 2021-07-22 ENCOUNTER — Encounter (INDEPENDENT_AMBULATORY_CARE_PROVIDER_SITE_OTHER): Payer: Self-pay | Admitting: Family Medicine

## 2021-07-22 VITALS — BP 125/67 | HR 67 | Temp 98.3°F | Ht 61.0 in | Wt 335.0 lb

## 2021-07-22 DIAGNOSIS — E7849 Other hyperlipidemia: Secondary | ICD-10-CM | POA: Diagnosis not present

## 2021-07-22 DIAGNOSIS — Z6841 Body Mass Index (BMI) 40.0 and over, adult: Secondary | ICD-10-CM | POA: Diagnosis not present

## 2021-07-22 DIAGNOSIS — E662 Morbid (severe) obesity with alveolar hypoventilation: Secondary | ICD-10-CM | POA: Diagnosis not present

## 2021-07-23 NOTE — Progress Notes (Signed)
Chief Complaint:   OBESITY Maria Stone is here to discuss her progress with her obesity treatment plan along with follow-up of her obesity related diagnoses. Maria Stone is on following a lower carbohydrate, vegetable and lean protein rich diet plan and states she is following her eating plan approximately 100% of the time. Maria Stone states she is doing 0 minutes 0 times per week.  Today's visit was #: 38 Starting weight: 393 lbs Starting date: 12/31/2019 Today's weight: 335 lbs Today's date: 07/22/2021 Total lbs lost to date: 58 Total lbs lost since last in-office visit: 8  Interim History: Alison changed to a low carbohydrate plan and she has done weill with weight loss. She is doing well with meal planning, and she will be traveling soon so she has questions about how to do low carbohydrate on the road.  Subjective:   1. Other hyperlipidemia Maria Stone recently has an echocardiogram done which was mostly normal with and EF of 60-65%. She was started on a statin but she is not sure which one. It is not in the Epic system.  Assessment/Plan:   1. Other hyperlipidemia Cardiovascular risk and specific lipid/LDL goals reviewed. We discussed several lifestyle modifications today. Maria Stone will continue to work on diet and weight loss efforts. She will send a MyChart message with her statin and dose so we can add it to her records. Orders and follow up as documented in patient record.   2. Obesity with current BMI 63.4 Maria Stone is currently in the action stage of change. As such, her goal is to continue with weight loss efforts. She has agreed to following a lower carbohydrate, vegetable and lean protein rich diet plan.   Behavioral modification strategies: travel eating strategies.  Maria Stone has agreed to follow-up with our clinic in 3 to 4 weeks. She was informed of the importance of frequent follow-up visits to maximize her success with intensive lifestyle modifications for her multiple  health conditions.   Objective:   Blood pressure 125/67, pulse 67, temperature 98.3 F (36.8 C), height 5\' 1"  (1.549 m), weight (!) 335 lb (152 kg), SpO2 96 %. Body mass index is 63.3 kg/m.  General: Cooperative, alert, well developed, in no acute distress. HEENT: Conjunctivae and lids unremarkable. Cardiovascular: Regular rhythm.  Lungs: Normal work of breathing. Neurologic: No focal deficits.   Lab Results  Component Value Date   CREATININE 1.11 (H) 03/25/2021   BUN 28 (H) 03/25/2021   NA 142 03/25/2021   K 4.8 03/25/2021   CL 102 03/25/2021   CO2 23 03/25/2021   Lab Results  Component Value Date   ALT 15 03/25/2021   AST 22 03/25/2021   ALKPHOS 104 03/25/2021   BILITOT 0.7 03/25/2021   Lab Results  Component Value Date   HGBA1C 5.3 03/25/2021   HGBA1C 5.4 08/27/2020   HGBA1C 5.1 03/05/2020   HGBA1C 5.3 09/26/2019   HGBA1C 5.4 12/30/2018   Lab Results  Component Value Date   INSULIN 10.0 03/25/2021   INSULIN 17.1 08/27/2020   INSULIN 12.3 03/05/2020   INSULIN 12.3 09/26/2019   INSULIN 16.9 12/30/2018   Lab Results  Component Value Date   TSH 1.290 03/05/2020   Lab Results  Component Value Date   CHOL 266 (H) 03/25/2021   HDL 52 03/25/2021   LDLCALC 191 (H) 03/25/2021   TRIG 126 03/25/2021   CHOLHDL 7.3 03/07/2008   Lab Results  Component Value Date   VD25OH 42.8 03/25/2021   VD25OH 49.1 08/27/2020   VD25OH  47.0 03/05/2020   Lab Results  Component Value Date   WBC 5.7 03/25/2021   HGB 14.4 03/25/2021   HCT 43.9 03/25/2021   MCV 94 03/25/2021   PLT 163 03/25/2021   Lab Results  Component Value Date   IRON 76 07/21/2008   TIBC 295 07/21/2008   FERRITIN 219 07/21/2008   Attestation Statements:   Reviewed by clinician on day of visit: allergies, medications, problem list, medical history, surgical history, family history, social history, and previous encounter notes.  Time spent on visit including pre-visit chart review and post-visit  care and charting was 30 minutes.    I, Burt Knack, am acting as transcriptionist for Quillian Quince, MD.  I have reviewed the above documentation for accuracy and completeness, and I agree with the above. -  Quillian Quince, MD

## 2021-07-25 ENCOUNTER — Other Ambulatory Visit (INDEPENDENT_AMBULATORY_CARE_PROVIDER_SITE_OTHER): Payer: Self-pay | Admitting: Family Medicine

## 2021-07-25 ENCOUNTER — Encounter (INDEPENDENT_AMBULATORY_CARE_PROVIDER_SITE_OTHER): Payer: Self-pay | Admitting: Family Medicine

## 2021-07-25 DIAGNOSIS — E559 Vitamin D deficiency, unspecified: Secondary | ICD-10-CM

## 2021-07-25 NOTE — Telephone Encounter (Signed)
Pt last seen by Dr. Beasley.  

## 2021-08-10 ENCOUNTER — Other Ambulatory Visit (INDEPENDENT_AMBULATORY_CARE_PROVIDER_SITE_OTHER): Payer: Self-pay | Admitting: Family Medicine

## 2021-08-10 DIAGNOSIS — E559 Vitamin D deficiency, unspecified: Secondary | ICD-10-CM

## 2021-08-12 NOTE — Telephone Encounter (Signed)
Pt last seen by Dr. Beasley.  

## 2021-08-19 ENCOUNTER — Encounter (INDEPENDENT_AMBULATORY_CARE_PROVIDER_SITE_OTHER): Payer: Self-pay | Admitting: Family Medicine

## 2021-08-19 ENCOUNTER — Other Ambulatory Visit: Payer: Self-pay

## 2021-08-19 ENCOUNTER — Ambulatory Visit (INDEPENDENT_AMBULATORY_CARE_PROVIDER_SITE_OTHER): Payer: Medicare Other | Admitting: Family Medicine

## 2021-08-19 VITALS — BP 103/51 | HR 70 | Temp 97.8°F | Ht 61.0 in | Wt 330.0 lb

## 2021-08-19 DIAGNOSIS — E662 Morbid (severe) obesity with alveolar hypoventilation: Secondary | ICD-10-CM

## 2021-08-19 DIAGNOSIS — Z6841 Body Mass Index (BMI) 40.0 and over, adult: Secondary | ICD-10-CM | POA: Diagnosis not present

## 2021-08-19 DIAGNOSIS — E7849 Other hyperlipidemia: Secondary | ICD-10-CM | POA: Diagnosis not present

## 2021-08-19 DIAGNOSIS — E559 Vitamin D deficiency, unspecified: Secondary | ICD-10-CM

## 2021-08-20 NOTE — Progress Notes (Signed)
Chief Complaint:   OBESITY Maria Stone is here to discuss her progress with her obesity treatment plan along with follow-up of her obesity related diagnoses. Maria Stone is on following a lower carbohydrate, vegetable and lean protein rich diet plan and states she is following her eating plan approximately 95% of the time. Maria Stone states she is doing chair workouts for 15 minutes 3 times per week.  Today's visit was #: 39 Starting weight: 393 lbs Starting date: 12/31/2019 Today's weight: 330 lbs Today's date: 08/19/2021 Total lbs lost to date: 63 Total lbs lost since last in-office visit: 5  Interim History: Maria Stone continues to do well with weight loss. She is getting tired of her low carbohydrate plan, and she is open to looking at other options. She is doing more chair exercises but she is careful with her knee osteoarthritis.  Subjective:   1. Other hyperlipidemia Maria Stone is stable on Crestor and she is doing well with diet. She is due to have her labs checked soon.  Assessment/Plan:   1. Other hyperlipidemia Cardiovascular risk and specific lipid/LDL goals reviewed. We discussed several lifestyle modifications today. Maria Stone will continue Crestor, diet, exercise and weight loss efforts. We will recheck labs in 1 month. Orders and follow up as documented in patient record.   2. Obesity with current BMI 62.4 Maria Stone is currently in the action stage of change. As such, her goal is to continue with weight loss efforts. She has agreed to change to the Category 3 Plan for 2 weeks, and then following a lower carbohydrate, vegetable and lean protein rich diet plan for 2 weeks.   We will recheck fasting labs at her next visit.  Exercise goals: Increase exercise to 5-6 times per week.  Behavioral modification strategies: increasing lean protein intake.  Maria Stone has agreed to follow-up with our clinic in 4 weeks. She was informed of the importance of frequent follow-up visits to  maximize her success with intensive lifestyle modifications for her multiple health conditions.   Objective:   Blood pressure (!) 103/51, pulse 70, temperature 97.8 F (36.6 C), height 5\' 1"  (1.549 m), weight (!) 330 lb (149.7 kg), SpO2 96 %. Body mass index is 62.35 kg/m.  General: Cooperative, alert, well developed, in no acute distress. HEENT: Conjunctivae and lids unremarkable. Cardiovascular: Regular rhythm.  Lungs: Normal work of breathing. Neurologic: No focal deficits.   Lab Results  Component Value Date   CREATININE 1.11 (H) 03/25/2021   BUN 28 (H) 03/25/2021   NA 142 03/25/2021   K 4.8 03/25/2021   CL 102 03/25/2021   CO2 23 03/25/2021   Lab Results  Component Value Date   ALT 15 03/25/2021   AST 22 03/25/2021   ALKPHOS 104 03/25/2021   BILITOT 0.7 03/25/2021   Lab Results  Component Value Date   HGBA1C 5.3 03/25/2021   HGBA1C 5.4 08/27/2020   HGBA1C 5.1 03/05/2020   HGBA1C 5.3 09/26/2019   HGBA1C 5.4 12/30/2018   Lab Results  Component Value Date   INSULIN 10.0 03/25/2021   INSULIN 17.1 08/27/2020   INSULIN 12.3 03/05/2020   INSULIN 12.3 09/26/2019   INSULIN 16.9 12/30/2018   Lab Results  Component Value Date   TSH 1.290 03/05/2020   Lab Results  Component Value Date   CHOL 266 (H) 03/25/2021   HDL 52 03/25/2021   LDLCALC 191 (H) 03/25/2021   TRIG 126 03/25/2021   CHOLHDL 7.3 03/07/2008   Lab Results  Component Value Date   VD25OH 42.8  03/25/2021   VD25OH 49.1 08/27/2020   VD25OH 47.0 03/05/2020   Lab Results  Component Value Date   WBC 5.7 03/25/2021   HGB 14.4 03/25/2021   HCT 43.9 03/25/2021   MCV 94 03/25/2021   PLT 163 03/25/2021   Lab Results  Component Value Date   IRON 76 07/21/2008   TIBC 295 07/21/2008   FERRITIN 219 07/21/2008   Attestation Statements:   Reviewed by clinician on day of visit: allergies, medications, problem list, medical history, surgical history, family history, social history, and previous  encounter notes.  Time spent on visit including pre-visit chart review and post-visit care and charting was 30 minutes.    I, Burt Knack, am acting as transcriptionist for Quillian Quince, MD.  I have reviewed the above documentation for accuracy and completeness, and I agree with the above. -  Quillian Quince, MD

## 2021-09-18 ENCOUNTER — Ambulatory Visit (INDEPENDENT_AMBULATORY_CARE_PROVIDER_SITE_OTHER): Payer: Medicare Other | Admitting: Family Medicine

## 2021-09-26 ENCOUNTER — Other Ambulatory Visit (INDEPENDENT_AMBULATORY_CARE_PROVIDER_SITE_OTHER): Payer: Self-pay | Admitting: Family Medicine

## 2021-09-26 DIAGNOSIS — E559 Vitamin D deficiency, unspecified: Secondary | ICD-10-CM

## 2021-09-26 NOTE — Telephone Encounter (Signed)
LAST APPOINTMENT DATE: 08/19/21 NEXT APPOINTMENT DATE: None   [id number] [place], [place] - [address] [place] [address] Phone: [phone] Fax: [phone]  [facility] Delivery) - [place], [place] - [address] [place] [address] [place] [address] Phone: [phone] Fax: [phone]  [facility] ([facility]) - [place], [place] - [address] [place] [address] Phone: [phone] Fax: [phone]  Patient is requesting a refill of the following medications: Requested Prescriptions   Pending Prescriptions Disp Refills   Vitamin D, Ergocalciferol, (DRISDOL) 1.25 MG (50000 UNIT) CAPS capsule [Pharmacy Med Name: Vitamin D (Ergocalciferol) 1.25 MG (50000 UT) Oral Capsule] 12 capsule 3    Sig: TAKE 1 CAPSULE BY MOUTH 1  TIME EVERY 7 DAYS    Date last filled: 06/25/21 Previously prescribed by Dr. Dalbert Garnet  Lab Results  Component Value Date   HGBA1C 5.3 03/25/2021   HGBA1C 5.4 08/27/2020   HGBA1C 5.1 03/05/2020   Lab Results  Component Value Date   LDLCALC 191 (H) 03/25/2021   CREATININE 1.11 (H) 03/25/2021   Lab Results  Component Value Date   VD25OH 42.8 03/25/2021   VD25OH 49.1 08/27/2020   VD25OH 47.0 03/05/2020    BP Readings from Last 3 Encounters:  08/19/21 (!) 103/51  07/22/21 125/67  06/24/21 130/69

## 2021-10-04 ENCOUNTER — Other Ambulatory Visit (INDEPENDENT_AMBULATORY_CARE_PROVIDER_SITE_OTHER): Payer: Self-pay | Admitting: Family Medicine

## 2021-10-04 DIAGNOSIS — E559 Vitamin D deficiency, unspecified: Secondary | ICD-10-CM

## 2021-10-07 NOTE — Telephone Encounter (Signed)
LAST APPOINTMENT DATE: 08/19/21 NEXT APPOINTMENT DATE: None scheduled   [id number] [place], [place] - [address] [place] [address] Phone: [phone] Fax: [phone]  [facility]) - [place], [place] - [address] [facility] [address] [place] [address] [place] [address] Phone: [phone] Fax: [phone]  [facility] ([facility]) - [place], [place] - [address] [place] [address] Phone: [phone] Fax: [phone]  Patient is requesting a refill of the following medications: Requested Prescriptions   Pending Prescriptions Disp Refills   Vitamin D, Ergocalciferol, (DRISDOL) 1.25 MG (50000 UNIT) CAPS capsule [Pharmacy Med Name: VITAMIN D2 1.25MG (50,000 UNIT)] 12 capsule     Sig: TAKE 1 CAPSULE BY MOUTH EVERY 7 DAYS    Date last filled: 06/25/21 Previously prescribed by Dr. Dalbert Garnet  Lab Results  Component Value Date   HGBA1C 5.3 03/25/2021   HGBA1C 5.4 08/27/2020   HGBA1C 5.1 03/05/2020   Lab Results  Component Value Date   LDLCALC 191 (H) 03/25/2021   CREATININE 1.11 (H) 03/25/2021   Lab Results  Component Value Date   VD25OH 42.8 03/25/2021   VD25OH 49.1 08/27/2020   VD25OH 47.0 03/05/2020    BP Readings from Last 3 Encounters:  08/19/21 (!) 103/51  07/22/21 125/67  06/24/21 130/69

## 2021-11-10 ENCOUNTER — Other Ambulatory Visit (INDEPENDENT_AMBULATORY_CARE_PROVIDER_SITE_OTHER): Payer: Self-pay | Admitting: Family Medicine

## 2021-11-10 DIAGNOSIS — E559 Vitamin D deficiency, unspecified: Secondary | ICD-10-CM

## 2021-11-12 ENCOUNTER — Encounter (INDEPENDENT_AMBULATORY_CARE_PROVIDER_SITE_OTHER): Payer: Self-pay

## 2021-12-23 ENCOUNTER — Ambulatory Visit (INDEPENDENT_AMBULATORY_CARE_PROVIDER_SITE_OTHER): Payer: Medicare Other | Admitting: Family Medicine

## 2021-12-23 ENCOUNTER — Encounter (INDEPENDENT_AMBULATORY_CARE_PROVIDER_SITE_OTHER): Payer: Self-pay | Admitting: Family Medicine

## 2021-12-23 ENCOUNTER — Other Ambulatory Visit: Payer: Self-pay

## 2021-12-23 VITALS — HR 67 | Temp 98.5°F | Ht 61.0 in | Wt 351.0 lb

## 2021-12-23 DIAGNOSIS — Z6841 Body Mass Index (BMI) 40.0 and over, adult: Secondary | ICD-10-CM

## 2021-12-23 DIAGNOSIS — E559 Vitamin D deficiency, unspecified: Secondary | ICD-10-CM

## 2021-12-23 DIAGNOSIS — E662 Morbid (severe) obesity with alveolar hypoventilation: Secondary | ICD-10-CM

## 2021-12-23 DIAGNOSIS — E669 Obesity, unspecified: Secondary | ICD-10-CM | POA: Diagnosis not present

## 2021-12-23 DIAGNOSIS — I10 Essential (primary) hypertension: Secondary | ICD-10-CM | POA: Diagnosis not present

## 2021-12-23 MED ORDER — VITAMIN D (ERGOCALCIFEROL) 1.25 MG (50000 UNIT) PO CAPS: ORAL_CAPSULE | ORAL | 0 refills | Status: DC

## 2021-12-23 NOTE — Progress Notes (Signed)
Chief Complaint:   OBESITY Gustavo is here to discuss her progress with her obesity treatment plan along with follow-up of her obesity related diagnoses. Oris is on the Category 3 Plan and states she is following her eating plan approximately 30-40% of the time. Seanne states she is doing 0 minutes 0 times per week.  Today's visit was #: 40 Starting weight: 393 lbs Starting date: 12/31/2019 Today's weight: 351 lbs Today's date: 12/23/2021 Total lbs lost to date: 42 Total lbs lost since last in-office visit: 0  Interim History: Nancyjo's last visit was approximately 4 months ago. She has had a lot of stressors and recent deaths of family and friends, and she has naturally gotten off  track. She is ready to concentrate on her health again.  Subjective:   1. Vitamin D deficiency Elania has been off her Vit D, and she requests a refill. She is due for labs soon.  2. Essential hypertension Margerie's blood pressure is stable on her medications, and she is ready to get back to diet and exercise.  Assessment/Plan:   1. Vitamin D deficiency Low Vitamin D level contributes to fatigue and are associated with obesity, breast, and colon cancer. We will refill prescription Vitamin D for 1 month, and we will recheck labs in 3 weeks. Pansy will follow-up for routine testing of Vitamin D, at least 2-3 times per year to avoid over-replacement.  - Vitamin D, Ergocalciferol, (DRISDOL) 1.25 MG (50000 UNIT) CAPS capsule; TAKE 1 CAPSULE BY MOUTH EVERY 7 DAYS  Dispense: 4 capsule; Refill: 0  2. Essential hypertension Jacilyn will continue her medications, and  weight loss. We will recheck labs in 3 weeks. She will watch for signs of hypotension as she continues her lifestyle modifications.  3. Obesity with current BMI 66.4 Kieara is currently in the action stage of change. As such, her goal is to continue with weight loss efforts. She has agreed to change to following a lower  carbohydrate, vegetable and lean protein rich diet plan.   We will recheck fasting labs at her next office visit.  Behavioral modification strategies: increasing lean protein intake and increasing water intake.  Marlinda has agreed to follow-up with our clinic in 3 weeks. She was informed of the importance of frequent follow-up visits to maximize her success with intensive lifestyle modifications for her multiple health conditions.   Objective:   Pulse 67, temperature 98.5 F (36.9 C), height 5\' 1"  (1.549 m), weight (!) 351 lb (159.2 kg), SpO2 98 %. Body mass index is 66.32 kg/m.  General: Cooperative, alert, well developed, in no acute distress. HEENT: Conjunctivae and lids unremarkable. Cardiovascular: Regular rhythm.  Lungs: Normal work of breathing. Neurologic: No focal deficits.   Lab Results  Component Value Date   CREATININE 1.11 (H) 03/25/2021   BUN 28 (H) 03/25/2021   NA 142 03/25/2021   K 4.8 03/25/2021   CL 102 03/25/2021   CO2 23 03/25/2021   Lab Results  Component Value Date   ALT 15 03/25/2021   AST 22 03/25/2021   ALKPHOS 104 03/25/2021   BILITOT 0.7 03/25/2021   Lab Results  Component Value Date   HGBA1C 5.3 03/25/2021   HGBA1C 5.4 08/27/2020   HGBA1C 5.1 03/05/2020   HGBA1C 5.3 09/26/2019   HGBA1C 5.4 12/30/2018   Lab Results  Component Value Date   INSULIN 10.0 03/25/2021   INSULIN 17.1 08/27/2020   INSULIN 12.3 03/05/2020   INSULIN 12.3 09/26/2019   INSULIN 16.9 12/30/2018  Lab Results  Component Value Date   TSH 1.290 03/05/2020   Lab Results  Component Value Date   CHOL 266 (H) 03/25/2021   HDL 52 03/25/2021   LDLCALC 191 (H) 03/25/2021   TRIG 126 03/25/2021   CHOLHDL 7.3 03/07/2008   Lab Results  Component Value Date   VD25OH 42.8 03/25/2021   VD25OH 49.1 08/27/2020   VD25OH 47.0 03/05/2020   Lab Results  Component Value Date   WBC 5.7 03/25/2021   HGB 14.4 03/25/2021   HCT 43.9 03/25/2021   MCV 94 03/25/2021   PLT  163 03/25/2021   Lab Results  Component Value Date   IRON 76 07/21/2008   TIBC 295 07/21/2008   FERRITIN 219 07/21/2008    Obesity Behavioral Intervention:   Approximately 15 minutes were spent on the discussion below.  ASK: We discussed the diagnosis of obesity with [name] today and Coriana agreed to give [name] permission to discuss obesity behavioral modification therapy today.  ASSESS: Deaun has the diagnosis of obesity and her BMI today is 66.4. Yarnell is in the action stage of change.   ADVISE: Ammarie was educated on the multiple health risks of obesity as well as the benefit of weight loss to improve her health. She was advised of the need for long term treatment and the importance of lifestyle modifications to improve her current health and to decrease her risk of future health problems.  AGREE: Multiple dietary modification options and treatment options were discussed and Markeita agreed to follow the recommendations documented in the above note.  ARRANGE: Jae was educated on the importance of frequent visits to treat obesity as outlined per CMS and USPSTF guidelines and agreed to schedule her next follow up appointment today.  Attestation Statements:   Reviewed by clinician on day of visit: allergies, medications, problem list, medical history, surgical history, family history, social history, and previous encounter notes.   I, Burt Knack, am acting as transcriptionist for Quillian Quince, MD.  I have reviewed the above documentation for accuracy and completeness, and I agree with the above. -  Quillian Quince, MD

## 2022-01-04 ENCOUNTER — Other Ambulatory Visit (INDEPENDENT_AMBULATORY_CARE_PROVIDER_SITE_OTHER): Payer: Self-pay | Admitting: Family Medicine

## 2022-01-04 DIAGNOSIS — E559 Vitamin D deficiency, unspecified: Secondary | ICD-10-CM

## 2022-01-06 NOTE — Telephone Encounter (Signed)
Dr.Beasley 

## 2022-01-13 ENCOUNTER — Encounter (INDEPENDENT_AMBULATORY_CARE_PROVIDER_SITE_OTHER): Payer: Self-pay | Admitting: Family Medicine

## 2022-01-13 ENCOUNTER — Ambulatory Visit (INDEPENDENT_AMBULATORY_CARE_PROVIDER_SITE_OTHER): Payer: Medicare Other | Admitting: Family Medicine

## 2022-01-13 ENCOUNTER — Other Ambulatory Visit: Payer: Self-pay

## 2022-01-13 VITALS — HR 73 | Temp 98.2°F | Ht 61.0 in | Wt 340.0 lb

## 2022-01-13 DIAGNOSIS — R7303 Prediabetes: Secondary | ICD-10-CM

## 2022-01-13 DIAGNOSIS — Z6841 Body Mass Index (BMI) 40.0 and over, adult: Secondary | ICD-10-CM

## 2022-01-13 DIAGNOSIS — E7800 Pure hypercholesterolemia, unspecified: Secondary | ICD-10-CM

## 2022-01-13 DIAGNOSIS — E669 Obesity, unspecified: Secondary | ICD-10-CM | POA: Diagnosis not present

## 2022-01-13 DIAGNOSIS — I10 Essential (primary) hypertension: Secondary | ICD-10-CM

## 2022-01-13 DIAGNOSIS — E559 Vitamin D deficiency, unspecified: Secondary | ICD-10-CM

## 2022-01-14 LAB — TSH: TSH: 1.39 u[IU]/mL (ref 0.450–4.500)

## 2022-01-14 LAB — CMP14+EGFR
ALT: 16 IU/L (ref 0–32)
AST: 17 IU/L (ref 0–40)
Albumin/Globulin Ratio: 2 (ref 1.2–2.2)
Albumin: 4.5 g/dL (ref 3.8–4.8)
Alkaline Phosphatase: 94 IU/L (ref 44–121)
BUN/Creatinine Ratio: 34 — ABNORMAL HIGH (ref 12–28)
BUN: 31 mg/dL — ABNORMAL HIGH (ref 8–27)
Bilirubin Total: 0.6 mg/dL (ref 0.0–1.2)
CO2: 22 mmol/L (ref 20–29)
Calcium: 10.2 mg/dL (ref 8.7–10.3)
Chloride: 102 mmol/L (ref 96–106)
Creatinine, Ser: 0.9 mg/dL (ref 0.57–1.00)
Globulin, Total: 2.3 g/dL (ref 1.5–4.5)
Glucose: 90 mg/dL (ref 70–99)
Potassium: 4.6 mmol/L (ref 3.5–5.2)
Sodium: 141 mmol/L (ref 134–144)
Total Protein: 6.8 g/dL (ref 6.0–8.5)
eGFR: 71 mL/min/{1.73_m2} (ref >59)

## 2022-01-14 LAB — CBC WITH DIFFERENTIAL/PLATELET
Basophils Absolute: 0 10*3/uL (ref 0.0–0.2)
Basos: 1 %
EOS (ABSOLUTE): 0.3 10*3/uL (ref 0.0–0.4)
Eos: 5 %
Hematocrit: 44.1 % (ref 34.0–46.6)
Hemoglobin: 14.2 g/dL (ref 11.1–15.9)
Immature Grans (Abs): 0 10*3/uL (ref 0.0–0.1)
Immature Granulocytes: 0 %
Lymphocytes Absolute: 1.3 10*3/uL (ref 0.7–3.1)
Lymphs: 28 %
MCH: 31.2 pg (ref 26.6–33.0)
MCHC: 32.2 g/dL (ref 31.5–35.7)
MCV: 97 fL (ref 79–97)
Monocytes Absolute: 0.4 10*3/uL (ref 0.1–0.9)
Monocytes: 9 %
Neutrophils Absolute: 2.7 10*3/uL (ref 1.4–7.0)
Neutrophils: 57 %
Platelets: 155 10*3/uL (ref 150–450)
RBC: 4.55 x10E6/uL (ref 3.77–5.28)
RDW: 11.7 % (ref 11.7–15.4)
WBC: 4.8 10*3/uL (ref 3.4–10.8)

## 2022-01-14 LAB — HEMOGLOBIN A1C
Est. average glucose Bld gHb Est-mCnc: 105 mg/dL
Hgb A1c MFr Bld: 5.3 % (ref 4.8–5.6)

## 2022-01-14 LAB — LIPID PANEL WITH LDL/HDL RATIO
Cholesterol, Total: 221 mg/dL — ABNORMAL HIGH (ref 100–199)
HDL: 58 mg/dL (ref >39)
LDL Chol Calc (NIH): 139 mg/dL — ABNORMAL HIGH (ref 0–99)
LDL/HDL Ratio: 2.4 ratio (ref 0.0–3.2)
Triglycerides: 134 mg/dL (ref 0–149)
VLDL Cholesterol Cal: 24 mg/dL (ref 5–40)

## 2022-01-14 LAB — INSULIN, RANDOM: INSULIN: 11.2 u[IU]/mL (ref 2.6–24.9)

## 2022-01-14 LAB — VITAMIN D 25 HYDROXY (VIT D DEFICIENCY, FRACTURES): Vit D, 25-Hydroxy: 78.4 ng/mL (ref 30.0–100.0)

## 2022-01-15 NOTE — Progress Notes (Signed)
? ? ? ?Chief Complaint:  ? ?OBESITY ?Maria Stone is here to discuss her progress with her obesity treatment plan along with follow-up of her obesity related diagnoses. Maria Stone is on following a lower carbohydrate, vegetable and lean protein rich diet plan and states she is following her eating plan approximately 100% of the time. Maria Stone states she is walking more. ? ?Today's visit was #: 41 ?Starting weight: 393 lbs ?Starting date: 12/31/2019 ?Today's weight: 340 lbs ?Today's date: 01/13/2022 ?Total lbs lost to date: 25 ?Total lbs lost since last in-office visit: 11 ? ?Interim History: Maria Stone had been struggling with weight gain over the holidays, but she has done very well with the change to her low carbohydrate plan and has gotten back on track with her weight loss efforts. She is struggling with low carbohydrates and especially misses fruit on her plan. ? ?Subjective:  ? ?1. Essential hypertension ?Maria Stone's blood pressure is stable today. She is doing well with diet and weight loss. No signs of hypotension. ? ?2. Vitamin D deficiency ?Maria Stone is on Vit D, and she is due to have labs checked. No side effects were noted. ? ?3. Pure hypercholesterolemia ?Maria Stone is working on diet, exercise, and weight loss. She is intolerant to simvastatin but she is still on Crestor. She is due for labs. ? ?4. Pre-diabetes ?Maria Stone is working on diet, and she had some dietary challenges over the holidays but she has gotten back on track in the last month. ? ?Assessment/Plan:  ? ?1. Essential hypertension ?We will check labs today. Maria Stone will continue working on diet and exercise to improve blood pressure control. We will watch for signs of hypotension as she continues her lifestyle modifications. ? ?- CMP14+EGFR ?- CBC with Differential/Platelet ?- TSH ? ?2. Vitamin D deficiency ?Low Vitamin D level contributes to fatigue and are associated with obesity, breast, and colon cancer. We will check labs today. Maria Stone will  follow-up for routine testing of Vitamin D, at least 2-3 times per year to avoid over-replacement. ? ?- VITAMIN D 25 Hydroxy (Vit-D Deficiency, Fractures) ? ?3. Pure hypercholesterolemia ?Cardiovascular risk and specific lipid/LDL goals reviewed.  We discussed several lifestyle modifications today. We will check labs today. Maria Stone will continue to work on diet, exercise and weight loss efforts. Orders and follow up as documented in patient record.  ? ?- Lipid Panel With LDL/HDL Ratio ? ?4. Pre-diabetes ?We will check labs today. Maria Stone will continue to work on diet, exercise, and decreasing simple carbohydrates to help decrease the risk of diabetes. We will follow up in 3 weeks to review labs in person and reassess her plan. ? ?- Insulin, random ?- Hemoglobin A1c ? ?5. Obesity with current BMI 64.3 ?Maria Stone is currently in the action stage of change. As such, her goal is to continue with weight loss efforts. She has agreed to following a lower carbohydrate, vegetable and lean protein rich diet plan + fruit 2 times per day (low sugar fruit options were given today).  ? ?Exercise goals: As is. ? ?Behavioral modification strategies: increasing lean protein intake and meal planning and cooking strategies. ? ?Maria Stone has agreed to follow-up with our clinic in 3 weeks. She was informed of the importance of frequent follow-up visits to maximize her success with intensive lifestyle modifications for her multiple health conditions.  ? ?Maria Stone was informed we would discuss her lab results at her next visit unless there is a critical issue that needs to be addressed sooner. Maria Stone agreed to keep her next visit at  the agreed upon time to discuss these results. ? ?Objective:  ? ?Pulse 73, temperature 98.2 ?F (36.8 ?C), height _0  (1.549 m), weight (!) 340 lb (154.2 kg), SpO2 97 %. ?Body mass index is 64.24 kg/m?. ? ?General: Cooperative, alert, well developed, in no acute distress. ?HEENT: Conjunctivae and lids  unremarkable. ?Cardiovascular: Regular rhythm.  ?Lungs: Normal work of breathing. ?Neurologic: No focal deficits.  ? ?Lab Results  ?Component Value Date  ? CREATININE 0.90 01/13/2022  ? BUN 31 (H) 01/13/2022  ? NA 141 01/13/2022  ? K 4.6 01/13/2022  ? CL 102 01/13/2022  ? CO2 22 01/13/2022  ? ?Lab Results  ?Component Value Date  ? ALT 16 01/13/2022  ? AST 17 01/13/2022  ? ALKPHOS 94 01/13/2022  ? BILITOT 0.6 01/13/2022  ? ?Lab Results  ?Component Value Date  ? HGBA1C 5.3 01/13/2022  ? HGBA1C 5.3 03/25/2021  ? HGBA1C 5.4 08/27/2020  ? HGBA1C 5.1 03/05/2020  ? HGBA1C 5.3 09/26/2019  ? ?Lab Results  ?Component Value Date  ? INSULIN 11.2 01/13/2022  ? INSULIN 10.0 03/25/2021  ? INSULIN 17.1 08/27/2020  ? INSULIN 12.3 03/05/2020  ? INSULIN 12.3 09/26/2019  ? ?Lab Results  ?Component Value Date  ? TSH 1.390 01/13/2022  ? ?Lab Results  ?Component Value Date  ? CHOL 221 (H) 01/13/2022  ? HDL 58 01/13/2022  ? LDLCALC 139 (H) 01/13/2022  ? TRIG 134 01/13/2022  ? CHOLHDL 7.3 03/07/2008  ? ?Lab Results  ?Component Value Date  ? VD25OH 78.4 01/13/2022  ? VD25OH 42.8 03/25/2021  ? VD25OH 49.1 08/27/2020  ? ?Lab Results  ?Component Value Date  ? WBC 4.8 01/13/2022  ? HGB 14.2 01/13/2022  ? HCT 44.1 01/13/2022  ? MCV 97 01/13/2022  ? PLT 155 01/13/2022  ? ?Lab Results  ?Component Value Date  ? IRON 76 07/21/2008  ? TIBC 295 07/21/2008  ? FERRITIN 219 07/21/2008  ? ? ?Obesity Behavioral Intervention:  ? ?Approximately 15 minutes were spent on the discussion below. ? ?ASK: ?We discussed the diagnosis of obesity with Maria Stone today and [name] agreed to give [name] permission to discuss obesity behavioral modification therapy today. ? ?ASSESS: ?Maria Stone has the diagnosis of obesity and her BMI today is 64.3. Maria Stone is in the action stage of change.  ? ?ADVISE: ?Maria Stone was educated on the multiple health risks of obesity as well as the benefit of weight loss to improve her health. She was advised of the need for long term treatment  and the importance of lifestyle modifications to improve her current health and to decrease her risk of future health problems. ? ?AGREE: ?Multiple dietary modification options and treatment options were discussed and Maria Stone agreed to follow the recommendations documented in the above note. ? ?ARRANGE: ?Maria Stone was educated on the importance of frequent visits to treat obesity as outlined per CMS and USPSTF guidelines and agreed to schedule her next follow up appointment today. ? ?Attestation Statements:  ? ?Reviewed by clinician on day of visit: allergies, medications, problem list, medical history, surgical history, family history, social history, and previous encounter notes. ? ? ?I, Trixie Dredge, am acting as transcriptionist for Dennard Nip, MD. ? ?I have reviewed the above documentation for accuracy and completeness, and I agree with the above. -  Dennard Nip, MD ? ? ?

## 2022-02-03 ENCOUNTER — Other Ambulatory Visit: Payer: Self-pay

## 2022-02-03 ENCOUNTER — Ambulatory Visit (INDEPENDENT_AMBULATORY_CARE_PROVIDER_SITE_OTHER): Payer: Medicare Other | Admitting: Family Medicine

## 2022-02-03 ENCOUNTER — Encounter (INDEPENDENT_AMBULATORY_CARE_PROVIDER_SITE_OTHER): Payer: Self-pay | Admitting: Family Medicine

## 2022-02-03 VITALS — HR 73 | Temp 97.9°F | Ht 61.0 in | Wt 335.0 lb

## 2022-02-03 DIAGNOSIS — E860 Dehydration: Secondary | ICD-10-CM | POA: Diagnosis not present

## 2022-02-03 DIAGNOSIS — E7849 Other hyperlipidemia: Secondary | ICD-10-CM | POA: Diagnosis not present

## 2022-02-03 DIAGNOSIS — E669 Obesity, unspecified: Secondary | ICD-10-CM

## 2022-02-03 DIAGNOSIS — Z6841 Body Mass Index (BMI) 40.0 and over, adult: Secondary | ICD-10-CM | POA: Diagnosis not present

## 2022-02-03 DIAGNOSIS — E559 Vitamin D deficiency, unspecified: Secondary | ICD-10-CM

## 2022-02-04 NOTE — Progress Notes (Signed)
? ? ? ?Chief Complaint:  ? ?OBESITY ?Maria Stone is here to discuss her progress with her obesity treatment plan along with follow-up of her obesity related diagnoses. Maria Stone is on following a lower carbohydrate, vegetable and lean protein rich diet plan and states she is following her eating plan approximately 100% of the time. Maria Stone states she is doing more movement.  ? ?Today's visit was #: 42 ?Starting weight: 393 lbs ?Starting date: 12/31/2019 ?Today's weight: 335 lbs ?Today's date: 02/03/2022 ?Total lbs lost to date: 51 ?Total lbs lost since last in-office visit: 5 ? ?Interim History: Maria Stone continues to do well with weight loss. She is on the Lower carbohydrate plan, but she added fruit. She has increased her activity in general movement, dancing, and cleaning.  ? ?Subjective:  ? ?1. Vitamin D deficiency ?Maria Stone's Vit D level is now at goal. She is at high risk of over-replacement on her Vit D prescription. I discussed labs with the patient today. ? ?2. Dehydration ?Maria Stone's BUN is elevated. She is not showing signs of lightheadedness or tachycardia. I discussed labs with the patient today. ? ?3. Other hyperlipidemia ?Maria Stone is on Crestor and she is doing well with decreasing cholesterol rich food in her diet. She denies chest pain or myalgias. I discussed labs with the patient today. ? ?Assessment/Plan:  ? ?1. Vitamin D deficiency ?Maria Stone agreed to change prescription Vitamin D to 50,000 IU every 2 weeks and will follow-up for routine testing of Vitamin D, at least 2-3 times per year to avoid over-replacement. ? ?2. Dehydration ?Maria Stone will increase her water intake to 80+ oz per day, and we will recheck labs in 3 months. ? ?3. Other hyperlipidemia ?Cardiovascular risk and specific lipid/LDL goals reviewed. Maria Stone will continue Crestor, and will continue diet, exercise and weight loss efforts. Orders and follow up as documented in patient record.  ? ?4. Obesity with current BMI 63.3 ?Maria Stone  is currently in the action stage of change. As such, her goal is to continue with weight loss efforts. She has agreed to following a lower carbohydrate, vegetable and lean protein rich diet plan.  ? ?Exercise goals: As is. ? ?Behavioral modification strategies: increasing lean protein intake and meal planning and cooking strategies. ? ?Maria Stone has agreed to follow-up with our clinic in 3 weeks. She was informed of the importance of frequent follow-up visits to maximize her success with intensive lifestyle modifications for her multiple health conditions.  ? ?Objective:  ? ?Pulse 73, temperature 97.9 ?F (36.6 ?C), height 5\' 1"  (1.549 m), weight (!) 335 lb (152 kg), SpO2 97 %. ?Body mass index is 63.3 kg/m?. ? ?General: Cooperative, alert, well developed, in no acute distress. ?HEENT: Conjunctivae and lids unremarkable. ?Cardiovascular: Regular rhythm.  ?Lungs: Normal work of breathing. ?Neurologic: No focal deficits.  ? ?Lab Results  ?Component Value Date  ? CREATININE 0.90 01/13/2022  ? BUN 31 (H) 01/13/2022  ? NA 141 01/13/2022  ? K 4.6 01/13/2022  ? CL 102 01/13/2022  ? CO2 22 01/13/2022  ? ?Lab Results  ?Component Value Date  ? ALT 16 01/13/2022  ? AST 17 01/13/2022  ? ALKPHOS 94 01/13/2022  ? BILITOT 0.6 01/13/2022  ? ?Lab Results  ?Component Value Date  ? HGBA1C 5.3 01/13/2022  ? HGBA1C 5.3 03/25/2021  ? HGBA1C 5.4 08/27/2020  ? HGBA1C 5.1 03/05/2020  ? HGBA1C 5.3 09/26/2019  ? ?Lab Results  ?Component Value Date  ? INSULIN 11.2 01/13/2022  ? INSULIN 10.0 03/25/2021  ? INSULIN 17.1 08/27/2020  ?  INSULIN 12.3 03/05/2020  ? INSULIN 12.3 09/26/2019  ? ?Lab Results  ?Component Value Date  ? TSH 1.390 01/13/2022  ? ?Lab Results  ?Component Value Date  ? CHOL 221 (H) 01/13/2022  ? HDL 58 01/13/2022  ? LDLCALC 139 (H) 01/13/2022  ? TRIG 134 01/13/2022  ? CHOLHDL 7.3 03/07/2008  ? ?Lab Results  ?Component Value Date  ? VD25OH 78.4 01/13/2022  ? VD25OH 42.8 03/25/2021  ? VD25OH 49.1 08/27/2020  ? ?Lab Results   ?Component Value Date  ? WBC 4.8 01/13/2022  ? HGB 14.2 01/13/2022  ? HCT 44.1 01/13/2022  ? MCV 97 01/13/2022  ? PLT 155 01/13/2022  ? ?Lab Results  ?Component Value Date  ? IRON 76 07/21/2008  ? TIBC 295 07/21/2008  ? FERRITIN 219 07/21/2008  ? ? ?Obesity Behavioral Intervention:  ? ?Approximately 15 minutes were spent on the discussion below. ? ?ASK: ?We discussed the diagnosis of obesity with [name] today and Maria Stone agreed to give [name] permission to discuss obesity behavioral modification therapy today. ? ?ASSESS: ?Maria Stone has the diagnosis of obesity and her BMI today is 63.3. Maria Stone is in the action stage of change.  ? ?ADVISE: ?Maria Stone was educated on the multiple health risks of obesity as well as the benefit of weight loss to improve her health. She was advised of the need for long term treatment and the importance of lifestyle modifications to improve her current health and to decrease her risk of future health problems. ? ?AGREE: ?Multiple dietary modification options and treatment options were discussed and Maria Stone agreed to follow the recommendations documented in the above note. ? ?ARRANGE: ?Maria Stone was educated on the importance of frequent visits to treat obesity as outlined per CMS and USPSTF guidelines and agreed to schedule her next follow up appointment today. ? ?Attestation Statements:  ? ?Reviewed by clinician on day of visit: allergies, medications, problem list, medical history, surgical history, family history, social history, and previous encounter notes. ? ? ?I, Burt Knack, am acting as transcriptionist for Quillian Quince, MD. ? ?I have reviewed the above documentation for accuracy and completeness, and I agree with the above. -  Quillian Quince, MD ? ? ?

## 2022-02-24 ENCOUNTER — Encounter (INDEPENDENT_AMBULATORY_CARE_PROVIDER_SITE_OTHER): Payer: Self-pay | Admitting: Family Medicine

## 2022-02-24 ENCOUNTER — Ambulatory Visit (INDEPENDENT_AMBULATORY_CARE_PROVIDER_SITE_OTHER): Payer: Medicare Other | Admitting: Family Medicine

## 2022-02-24 VITALS — HR 64 | Ht 61.0 in | Wt 341.0 lb

## 2022-02-24 DIAGNOSIS — R609 Edema, unspecified: Secondary | ICD-10-CM

## 2022-02-24 DIAGNOSIS — Z6841 Body Mass Index (BMI) 40.0 and over, adult: Secondary | ICD-10-CM | POA: Diagnosis not present

## 2022-02-24 DIAGNOSIS — E669 Obesity, unspecified: Secondary | ICD-10-CM | POA: Diagnosis not present

## 2022-03-06 NOTE — Progress Notes (Signed)
? ? ? ?Chief Complaint:  ? ?OBESITY ?Maria Stone is here to discuss her progress with her obesity treatment plan along with follow-up of her obesity related diagnoses. Maria Stone is on following a lower carbohydrate, vegetable and lean protein rich diet plan and states she is following her eating plan approximately 100% of the time. Maria Stone states she is doing more activity 7 times per week. ? ?Today's visit was #: 43 ?Starting weight: 393 lbs ?Starting date: 12/31/2019 ?Today's weight: 341 lbs ?Today's date: 02/24/2022 ?Total lbs lost to date: 84 ?Total lbs lost since last in-office visit: 0 ? ?Interim History: Maria Stone continues to work on her diet and weight loss. She is retaining a fair amount of fluid today, but no shortness of breath. The bioimpedance scale shows she has lost 2 lbs of fat since her last visit, and her clothes are fitting looser. ? ?Subjective:  ? ?1. Edema, unspecified type ?Maria Stone is retaining fluid today. She had decreased her Lasix from 40 mg to 20 mg previously. Her last K+ was within normal limits. ? ?Assessment/Plan:  ? ?1. Edema, unspecified type ?Maria Stone is to increase her Lasix to 40 mg daily for 3 days and continue to monitor her Na+ intake. We will follow up at her next visit in 3 weeks.  ? ?2. Obesity with current BMI 64.5 ?Maria Stone is currently in the action stage of change. As such, her goal is to continue with weight loss efforts. She has agreed to following a lower carbohydrate, vegetable and lean protein rich diet plan.  ? ?Behavioral modification strategies: increasing lean protein intake. ? ?Maria Stone has agreed to follow-up with our clinic in 3 weeks. She was informed of the importance of frequent follow-up visits to maximize her success with intensive lifestyle modifications for her multiple health conditions.  ? ?Objective:  ? ?Pulse 64, height 5\' 1"  (1.549 m), weight (!) 341 lb (154.7 kg), SpO2 94 %. ?Body mass index is 64.43 kg/m?. ? ?General: Cooperative, alert, well  developed, in no acute distress. ?HEENT: Conjunctivae and lids unremarkable. ?Cardiovascular: Regular rhythm.  ?Lungs: Normal work of breathing. ?Neurologic: No focal deficits.  ? ?Lab Results  ?Component Value Date  ? CREATININE 0.90 01/13/2022  ? BUN 31 (H) 01/13/2022  ? NA 141 01/13/2022  ? K 4.6 01/13/2022  ? CL 102 01/13/2022  ? CO2 22 01/13/2022  ? ?Lab Results  ?Component Value Date  ? ALT 16 01/13/2022  ? AST 17 01/13/2022  ? ALKPHOS 94 01/13/2022  ? BILITOT 0.6 01/13/2022  ? ?Lab Results  ?Component Value Date  ? HGBA1C 5.3 01/13/2022  ? HGBA1C 5.3 03/25/2021  ? HGBA1C 5.4 08/27/2020  ? HGBA1C 5.1 03/05/2020  ? HGBA1C 5.3 09/26/2019  ? ?Lab Results  ?Component Value Date  ? INSULIN 11.2 01/13/2022  ? INSULIN 10.0 03/25/2021  ? INSULIN 17.1 08/27/2020  ? INSULIN 12.3 03/05/2020  ? INSULIN 12.3 09/26/2019  ? ?Lab Results  ?Component Value Date  ? TSH 1.390 01/13/2022  ? ?Lab Results  ?Component Value Date  ? CHOL 221 (H) 01/13/2022  ? HDL 58 01/13/2022  ? LDLCALC 139 (H) 01/13/2022  ? TRIG 134 01/13/2022  ? CHOLHDL 7.3 03/07/2008  ? ?Lab Results  ?Component Value Date  ? VD25OH 78.4 01/13/2022  ? VD25OH 42.8 03/25/2021  ? VD25OH 49.1 08/27/2020  ? ?Lab Results  ?Component Value Date  ? WBC 4.8 01/13/2022  ? HGB 14.2 01/13/2022  ? HCT 44.1 01/13/2022  ? MCV 97 01/13/2022  ? PLT 155 01/13/2022  ? ?  Lab Results  ?Component Value Date  ? IRON 76 07/21/2008  ? TIBC 295 07/21/2008  ? FERRITIN 219 07/21/2008  ? ? ?Obesity Behavioral Intervention:  ? ?Approximately 15 minutes were spent on the discussion below. ? ?ASK: ?We discussed the diagnosis of obesity with Maria Stone today and Maria Stone agreed to give [name] permission to discuss obesity behavioral modification therapy today. ? ?ASSESS: ?Maria Stone has the diagnosis of obesity and her BMI today is 64.5. Maria Stone is in the action stage of change.  ? ?ADVISE: ?Maria Stone was educated on the multiple health risks of obesity as well as the benefit of weight loss to improve her  health. She was advised of the need for long term treatment and the importance of lifestyle modifications to improve her current health and to decrease her risk of future health problems. ? ?AGREE: ?Multiple dietary modification options and treatment options were discussed and Maria Stone agreed to follow the recommendations documented in the above note. ? ?ARRANGE: ?Maria Stone was educated on the importance of frequent visits to treat obesity as outlined per CMS and USPSTF guidelines and agreed to schedule her next follow up appointment today. ? ?Attestation Statements:  ? ?Reviewed by clinician on day of visit: allergies, medications, problem list, medical history, surgical history, family history, social history, and previous encounter notes. ? ? ?I, Burt Knack, am acting as transcriptionist for Quillian Quince, MD. ? ?I have reviewed the above documentation for accuracy and completeness, and I agree with the above. -  Quillian Quince, MD ? ? ?

## 2022-03-18 ENCOUNTER — Ambulatory Visit (INDEPENDENT_AMBULATORY_CARE_PROVIDER_SITE_OTHER): Payer: Medicare Other | Admitting: Family Medicine

## 2022-04-08 ENCOUNTER — Encounter (INDEPENDENT_AMBULATORY_CARE_PROVIDER_SITE_OTHER): Payer: Self-pay | Admitting: Family Medicine

## 2022-04-08 ENCOUNTER — Ambulatory Visit (INDEPENDENT_AMBULATORY_CARE_PROVIDER_SITE_OTHER): Payer: Medicare Other | Admitting: Family Medicine

## 2022-04-08 VITALS — BP 113/69 | HR 69 | Temp 98.0°F | Ht 61.0 in | Wt 345.0 lb

## 2022-04-08 DIAGNOSIS — E669 Obesity, unspecified: Secondary | ICD-10-CM | POA: Diagnosis not present

## 2022-04-08 DIAGNOSIS — F3289 Other specified depressive episodes: Secondary | ICD-10-CM

## 2022-04-08 DIAGNOSIS — Z6841 Body Mass Index (BMI) 40.0 and over, adult: Secondary | ICD-10-CM | POA: Diagnosis not present

## 2022-04-08 DIAGNOSIS — E559 Vitamin D deficiency, unspecified: Secondary | ICD-10-CM | POA: Diagnosis not present

## 2022-04-15 NOTE — Progress Notes (Signed)
Chief Complaint:   OBESITY Maria Stone is here to discuss her progress with her obesity treatment plan along with follow-up of her obesity related diagnoses. Maria Stone is on following a lower carbohydrate, vegetable and lean protein rich diet plan and states she is following her eating plan approximately (unknown)% of the time. Jamar states she is walking during the day.    Today's visit was #: 44 Starting weight: 393 lbs Starting date: 12/31/2019 Today's weight: 345 lbs Today's date: 04/08/2022 Total lbs lost to date: 48 Total lbs lost since last in-office visit: 0  Interim History: Maria Stone has struggled with meal planning and she has gotten a bit off track and has been feeling discouraged recently.  Subjective:   1. Vitamin D deficiency Maria Stone is on Vitamin D every 2 weeks, but she has been forgetting to take it recently. She notes fatigue.   2. Other depression, with emotional eating behaviors  Maria Stone notes increased stress and some comfort eating, as well as forgetting to eat regularly.  Assessment/Plan:   1. Vitamin D deficiency We will recheck labs next month. Aiko agreed to restart Vitamin D and we will follow up at her next visit.   2. Other depression, with emotional eating behaviors  Cognitive behavioral therapy to help decrease emotional eating behaviors, and strategies were discussed. We will follow up in 1 month. Support and encouragement was offered.   3. Obesity, Current BMI 65.2 Maria Stone is currently in the action stage of change. As such, her goal is to continue with weight loss efforts. She has agreed to the Category 3 Plan and following a lower carbohydrate, vegetable and lean protein rich diet plan.   Exercise goals: As is.  Behavioral modification strategies: meal planning and cooking strategies.  Maria Stone has agreed to follow-up with our clinic in 4 weeks. She was informed of the importance of frequent follow-up visits to maximize her success  with intensive lifestyle modifications for her multiple health conditions.   Objective:   Blood pressure 113/69, pulse 69, temperature 98 F (36.7 C), height 5\' 1"  (1.549 m), weight (!) 345 lb (156.5 kg), SpO2 96 %. Body mass index is 65.19 kg/m.  General: Cooperative, alert, well developed, in no acute distress. HEENT: Conjunctivae and lids unremarkable. Cardiovascular: Regular rhythm.  Lungs: Normal work of breathing. Neurologic: No focal deficits.   Lab Results  Component Value Date   CREATININE 0.90 01/13/2022   BUN 31 (H) 01/13/2022   NA 141 01/13/2022   K 4.6 01/13/2022   CL 102 01/13/2022   CO2 22 01/13/2022   Lab Results  Component Value Date   ALT 16 01/13/2022   AST 17 01/13/2022   ALKPHOS 94 01/13/2022   BILITOT 0.6 01/13/2022   Lab Results  Component Value Date   HGBA1C 5.3 01/13/2022   HGBA1C 5.3 03/25/2021   HGBA1C 5.4 08/27/2020   HGBA1C 5.1 03/05/2020   HGBA1C 5.3 09/26/2019   Lab Results  Component Value Date   INSULIN 11.2 01/13/2022   INSULIN 10.0 03/25/2021   INSULIN 17.1 08/27/2020   INSULIN 12.3 03/05/2020   INSULIN 12.3 09/26/2019   Lab Results  Component Value Date   TSH 1.390 01/13/2022   Lab Results  Component Value Date   CHOL 221 (H) 01/13/2022   HDL 58 01/13/2022   LDLCALC 139 (H) 01/13/2022   TRIG 134 01/13/2022   CHOLHDL 7.3 03/07/2008   Lab Results  Component Value Date   VD25OH 78.4 01/13/2022   VD25OH 42.8 03/25/2021  VD25OH 49.1 08/27/2020   Lab Results  Component Value Date   WBC 4.8 01/13/2022   HGB 14.2 01/13/2022   HCT 44.1 01/13/2022   MCV 97 01/13/2022   PLT 155 01/13/2022   Lab Results  Component Value Date   IRON 76 07/21/2008   TIBC 295 07/21/2008   FERRITIN 219 07/21/2008   Attestation Statements:   Reviewed by clinician on day of visit: allergies, medications, problem list, medical history, surgical history, family history, social history, and previous encounter notes.  Time spent on  visit including pre-visit chart review and post-visit care and charting was 35 minutes.   I, Burt Knack, am acting as transcriptionist for Quillian Quince, MD.  I have reviewed the above documentation for accuracy and completeness, and I agree with the above. -  Quillian Quince, MD

## 2022-05-06 ENCOUNTER — Ambulatory Visit (INDEPENDENT_AMBULATORY_CARE_PROVIDER_SITE_OTHER): Payer: Medicare Other | Admitting: Family Medicine

## 2022-05-15 LAB — LIPID PANEL
Cholesterol: 233 — AB (ref 0–200)
HDL: 48 (ref 35–70)
LDL Cholesterol: 160
LDl/HDL Ratio: 3.3
Triglycerides: 124 (ref 40–160)

## 2022-05-15 LAB — BASIC METABOLIC PANEL
BUN: 29 — AB (ref 4–21)
CO2: 31 — AB (ref 13–22)
Chloride: 105 (ref 99–108)
Creatinine: 0.9 (ref 0.5–1.1)
Glucose: 102
Potassium: 5 mEq/L (ref 3.5–5.1)
Sodium: 142 (ref 137–147)

## 2022-05-15 LAB — CBC: RBC: 4.2 (ref 3.87–5.11)

## 2022-05-15 LAB — CBC AND DIFFERENTIAL
HCT: 42 (ref 36–46)
Hemoglobin: 14.1 (ref 12.0–16.0)
Neutrophils Absolute: 2.7
Platelets: 143 10*3/uL — AB (ref 150–400)
WBC: 4.8

## 2022-05-15 LAB — VITAMIN D 25 HYDROXY (VIT D DEFICIENCY, FRACTURES): Vit D, 25-Hydroxy: 28

## 2022-05-15 LAB — COMPREHENSIVE METABOLIC PANEL
Albumin: 3.6 (ref 3.5–5.0)
Calcium: 9 (ref 8.7–10.7)
eGFR: 62.5

## 2022-05-15 LAB — HEMOGLOBIN A1C: Hemoglobin A1C: 5.2

## 2022-05-15 LAB — HEPATIC FUNCTION PANEL
ALT: 13 U/L (ref 7–35)
AST: 18 (ref 13–35)
Alkaline Phosphatase: 98 (ref 25–125)
Bilirubin, Total: 0.9

## 2022-05-15 LAB — TSH: TSH: 1.25 (ref 0.41–5.90)

## 2022-05-29 ENCOUNTER — Encounter (INDEPENDENT_AMBULATORY_CARE_PROVIDER_SITE_OTHER): Payer: Self-pay | Admitting: Family Medicine

## 2022-05-29 ENCOUNTER — Ambulatory Visit (INDEPENDENT_AMBULATORY_CARE_PROVIDER_SITE_OTHER): Payer: Medicare Other | Admitting: Family Medicine

## 2022-05-29 VITALS — BP 125/74 | HR 65 | Temp 98.0°F | Ht 61.0 in | Wt 352.0 lb

## 2022-05-29 DIAGNOSIS — E669 Obesity, unspecified: Secondary | ICD-10-CM

## 2022-05-29 DIAGNOSIS — E559 Vitamin D deficiency, unspecified: Secondary | ICD-10-CM

## 2022-05-29 DIAGNOSIS — Z6841 Body Mass Index (BMI) 40.0 and over, adult: Secondary | ICD-10-CM

## 2022-05-29 DIAGNOSIS — E7800 Pure hypercholesterolemia, unspecified: Secondary | ICD-10-CM

## 2022-05-29 MED ORDER — VITAMIN D (ERGOCALCIFEROL) 1.25 MG (50000 UNIT) PO CAPS: ORAL_CAPSULE | ORAL | 0 refills | Status: DC

## 2022-06-04 NOTE — Progress Notes (Signed)
Chief Complaint:   OBESITY Maria Stone is here to discuss her progress with her obesity treatment plan along with follow-up of her obesity related diagnoses. Maria Stone is on the Category 3 Plan or following a lower carbohydrate, vegetable and lean protein rich diet plan and states she is following her eating plan approximately 70% of the time. Maria Stone states she is doing 0 minutes 0 times per week.  Today's visit was #: 45 Starting weight: 393 lbs Starting date: 12/31/2019 Today's weight: 352 lbs Today's date: 05/29/2022 Total lbs lost to date: 41 Total lbs lost since last in-office visit: 0  Interim History: Maria Stone has had a lot of celebrations and she did some celebration eating.  She is working on getting back on track.  She has questions about GLP-1 medications.  Subjective:   1. Pure hypercholesterolemia Maria Stone's cholesterol is well controlled on Crestor and with diet.  She denies myalgia or chest pain.  I discussed labs with patient today.  2. Vitamin D deficiency Maria Stone's vitamin D level has decreased and it is not at goal.  I discussed labs with the patient today.  Assessment/Plan:   1. Pure hypercholesterolemia Maria Stone continue with her diet and exercise, and we will continue to monitor.  2. Vitamin D deficiency We will refill prescription Vitamin D for 1 month. Maria Stone will follow-up for routine testing of Vitamin D, at least 2-3 times per year to avoid over-replacement.  - Vitamin D, Ergocalciferol, (DRISDOL) 1.25 MG (50000 UNIT) CAPS capsule; TAKE 1 CAPSULE BY MOUTH EVERY 7 DAYS  Dispense: 4 capsule; Refill: 0  3. Obesity, Current BMI 66.6 Maria Stone is currently in the action stage of change. As such, her goal is to continue with weight loss efforts. She has agreed to the Category 3 Plan.   Maria Stone would do well with GLP-once, but unfortunately her insurance will not cover them without having diabetes mellitus.  Behavioral modification strategies:  decreasing simple carbohydrates.  Maria Stone has agreed to follow-up with our clinic in 2 weeks. She was informed of the importance of frequent follow-up visits to maximize her success with intensive lifestyle modifications for her multiple health conditions.   Objective:   Blood pressure 125/74, pulse 65, temperature 98 F (36.7 C), height 5\' 1"  (1.549 m), weight (!) 352 lb (159.7 kg), SpO2 95 %. Body mass index is 66.51 kg/m.  General: Cooperative, alert, well developed, in no acute distress. HEENT: Conjunctivae and lids unremarkable. Cardiovascular: Regular rhythm.  Lungs: Normal work of breathing. Neurologic: No focal deficits.   Lab Results  Component Value Date   CREATININE 0.90 01/13/2022   BUN 31 (H) 01/13/2022   NA 141 01/13/2022   K 4.6 01/13/2022   CL 102 01/13/2022   CO2 22 01/13/2022   Lab Results  Component Value Date   ALT 16 01/13/2022   AST 17 01/13/2022   ALKPHOS 94 01/13/2022   BILITOT 0.6 01/13/2022   Lab Results  Component Value Date   HGBA1C 5.3 01/13/2022   HGBA1C 5.3 03/25/2021   HGBA1C 5.4 08/27/2020   HGBA1C 5.1 03/05/2020   HGBA1C 5.3 09/26/2019   Lab Results  Component Value Date   INSULIN 11.2 01/13/2022   INSULIN 10.0 03/25/2021   INSULIN 17.1 08/27/2020   INSULIN 12.3 03/05/2020   INSULIN 12.3 09/26/2019   Lab Results  Component Value Date   TSH 1.390 01/13/2022   Lab Results  Component Value Date   CHOL 221 (H) 01/13/2022   HDL 58 01/13/2022   LDLCALC 139 (  H) 01/13/2022   TRIG 134 01/13/2022   CHOLHDL 7.3 03/07/2008   Lab Results  Component Value Date   VD25OH 78.4 01/13/2022   VD25OH 42.8 03/25/2021   VD25OH 49.1 08/27/2020   Lab Results  Component Value Date   WBC 4.8 01/13/2022   HGB 14.2 01/13/2022   HCT 44.1 01/13/2022   MCV 97 01/13/2022   PLT 155 01/13/2022   Lab Results  Component Value Date   IRON 76 07/21/2008   TIBC 295 07/21/2008   FERRITIN 219 07/21/2008   Attestation Statements:   Reviewed  by clinician on day of visit: allergies, medications, problem list, medical history, surgical history, family history, social history, and previous encounter notes.   I, Burt Knack, am acting as transcriptionist for Quillian Quince, MD.  I have reviewed the above documentation for accuracy and completeness, and I agree with the above. -  Quillian Quince, MD

## 2022-06-10 ENCOUNTER — Encounter (INDEPENDENT_AMBULATORY_CARE_PROVIDER_SITE_OTHER): Payer: Self-pay | Admitting: Family Medicine

## 2022-06-10 ENCOUNTER — Ambulatory Visit (INDEPENDENT_AMBULATORY_CARE_PROVIDER_SITE_OTHER): Payer: Medicare Other | Admitting: Family Medicine

## 2022-06-10 VITALS — BP 114/68 | HR 67 | Temp 97.0°F | Ht 61.0 in | Wt 345.0 lb

## 2022-06-10 DIAGNOSIS — Z6841 Body Mass Index (BMI) 40.0 and over, adult: Secondary | ICD-10-CM

## 2022-06-10 DIAGNOSIS — E669 Obesity, unspecified: Secondary | ICD-10-CM

## 2022-06-10 DIAGNOSIS — I10 Essential (primary) hypertension: Secondary | ICD-10-CM

## 2022-06-11 ENCOUNTER — Institutional Professional Consult (permissible substitution): Payer: Medicare Other | Admitting: Neurology

## 2022-06-18 ENCOUNTER — Encounter (INDEPENDENT_AMBULATORY_CARE_PROVIDER_SITE_OTHER): Payer: Self-pay

## 2022-06-22 ENCOUNTER — Other Ambulatory Visit (INDEPENDENT_AMBULATORY_CARE_PROVIDER_SITE_OTHER): Payer: Self-pay | Admitting: Family Medicine

## 2022-06-22 DIAGNOSIS — E559 Vitamin D deficiency, unspecified: Secondary | ICD-10-CM

## 2022-06-22 NOTE — Progress Notes (Unsigned)
Chief Complaint:   OBESITY Maria Stone is here to discuss her progress with her obesity treatment plan along with follow-up of her obesity related diagnoses. Maria Stone is on the Category 3 Plan and states she is following her eating plan approximately 100% of the time. Maria Stone states she is doing 0 minutes 0 times per week.  Today's visit was #: 46 Starting weight: 393 lbs Starting date: 12/31/2019 Today's weight: 345 lbs Today's date: 06/10/2022 Total lbs lost to date: 48 Total lbs lost since last in-office visit: 7  Interim History: Maria Stone is done much better with meal planning and she is working on eating all of the food on her plan.  She had some extra temptations, but she did well with avoiding unhealthy snacks.  Subjective:   1. Essential hypertension Maria Stone's blood pressure is controlled, and she has no signs of hypotension.  She is doing well with her diet and weight loss.  Assessment/Plan:   1. Essential hypertension Maria Stone with her diet and weight loss to improve blood pressure control. We will watch for signs of hypotension as she continues her lifestyle modifications.  2. Obesity, Current BMI 65.3 Maria Stone is currently in the action stage of change. As such, her goal is to continue with weight loss efforts. She has agreed to the Category 3 Plan.   Exercise goals: Start chair yoga.   Behavioral modification strategies: increasing lean protein intake.  Maria Stone has agreed to follow-up with our clinic in 3 weeks. She was informed of the importance of frequent follow-up visits to maximize her success with intensive lifestyle modifications for her multiple health conditions.   Objective:   Blood pressure 114/68, pulse 67, temperature (!) 97 F (36.1 C), height 5\' 1"  (1.549 m), weight (!) 345 lb (156.5 kg), SpO2 93 %. Body mass index is 65.19 kg/m.  General: Cooperative, alert, well developed, in no acute distress. HEENT: Conjunctivae and lids  unremarkable. Cardiovascular: Regular rhythm.  Lungs: Normal work of breathing. Neurologic: No focal deficits.   Lab Results  Component Value Date   CREATININE 0.90 01/13/2022   BUN 31 (H) 01/13/2022   NA 141 01/13/2022   K 4.6 01/13/2022   CL 102 01/13/2022   CO2 22 01/13/2022   Lab Results  Component Value Date   ALT 16 01/13/2022   AST 17 01/13/2022   ALKPHOS 94 01/13/2022   BILITOT 0.6 01/13/2022   Lab Results  Component Value Date   HGBA1C 5.3 01/13/2022   HGBA1C 5.3 03/25/2021   HGBA1C 5.4 08/27/2020   HGBA1C 5.1 03/05/2020   HGBA1C 5.3 09/26/2019   Lab Results  Component Value Date   INSULIN 11.2 01/13/2022   INSULIN 10.0 03/25/2021   INSULIN 17.1 08/27/2020   INSULIN 12.3 03/05/2020   INSULIN 12.3 09/26/2019   Lab Results  Component Value Date   TSH 1.390 01/13/2022   Lab Results  Component Value Date   CHOL 221 (H) 01/13/2022   HDL 58 01/13/2022   LDLCALC 139 (H) 01/13/2022   TRIG 134 01/13/2022   CHOLHDL 7.3 03/07/2008   Lab Results  Component Value Date   VD25OH 78.4 01/13/2022   VD25OH 42.8 03/25/2021   VD25OH 49.1 08/27/2020   Lab Results  Component Value Date   WBC 4.8 01/13/2022   HGB 14.2 01/13/2022   HCT 44.1 01/13/2022   MCV 97 01/13/2022   PLT 155 01/13/2022   Lab Results  Component Value Date   IRON 76 07/21/2008   TIBC 295 07/21/2008  FERRITIN 219 07/21/2008   Attestation Statements:   Reviewed by clinician on day of visit: allergies, medications, problem list, medical history, surgical history, family history, social history, and previous encounter notes.  Time spent on visit including pre-visit chart review and post-visit care and charting was 22 minutes.   I, Burt Knack, am acting as transcriptionist for Quillian Quince, MD.  I have reviewed the above documentation for accuracy and completeness, and I agree with the above. -  Quillian Quince, MD

## 2022-06-30 ENCOUNTER — Ambulatory Visit (INDEPENDENT_AMBULATORY_CARE_PROVIDER_SITE_OTHER): Payer: Medicare Other | Admitting: Family Medicine

## 2022-06-30 ENCOUNTER — Encounter (INDEPENDENT_AMBULATORY_CARE_PROVIDER_SITE_OTHER): Payer: Self-pay | Admitting: Family Medicine

## 2022-06-30 VITALS — BP 134/73 | HR 65 | Temp 98.2°F | Ht 61.0 in | Wt 347.0 lb

## 2022-06-30 DIAGNOSIS — E669 Obesity, unspecified: Secondary | ICD-10-CM | POA: Diagnosis not present

## 2022-06-30 DIAGNOSIS — Z6841 Body Mass Index (BMI) 40.0 and over, adult: Secondary | ICD-10-CM | POA: Diagnosis not present

## 2022-06-30 DIAGNOSIS — F3289 Other specified depressive episodes: Secondary | ICD-10-CM | POA: Diagnosis not present

## 2022-07-07 NOTE — Progress Notes (Unsigned)
Chief Complaint:   OBESITY Maria Stone is here to discuss her progress with her obesity treatment plan along with follow-up of her obesity related diagnoses. Maria Stone is on the Category 3 Plan and states she is following her eating plan approximately 95% of the time. Maria Stone states she is doing 0 minutes 0 times per week.  Today's visit was #: 47 Starting weight: 393 lbs Starting date: 12/31/2019 Today's weight: 347 lbs Today's date: 06/30/2022 Total lbs lost to date: 46 Total lbs lost since last in-office visit: 0  Interim History: Maria Stone has eaten out more with social eating. She has tried to make better food choices, but some were still higher in calories than expected.   Subjective:   1. Other depression, with emotional eating behaviors Maria Stone continues to work on her emotional eating behaviors. Her Prozac was increased recently to 40 mg.   Assessment/Plan:   1. Other depression, with emotional eating behaviors Emotional eating behavior strategies were discussed, and support and encouragement were offered.   2. Obesity, Current BMI 65.6 Maria Stone is currently in the action stage of change. As such, her goal is to continue with weight loss efforts. She has agreed to the Category 3 Plan.   We discussed "hidden calories" in food especially when eating out.  Behavioral modification strategies: better snacking choices and avoiding temptations.  Maria Stone has agreed to follow-up with our clinic in 3 to 4 weeks. She was informed of the importance of frequent follow-up visits to maximize her success with intensive lifestyle modifications for her multiple health conditions.   Objective:   Blood pressure 134/73, pulse 65, temperature 98.2 F (36.8 C), height 5\' 1"  (1.549 m), weight (!) 347 lb (157.4 kg), SpO2 95 %. Body mass index is 65.57 kg/m.  General: Cooperative, alert, well developed, in no acute distress. HEENT: Conjunctivae and lids unremarkable. Cardiovascular:  Regular rhythm.  Lungs: Normal work of breathing. Neurologic: No focal deficits.   Lab Results  Component Value Date   CREATININE 0.90 01/13/2022   BUN 31 (H) 01/13/2022   NA 141 01/13/2022   K 4.6 01/13/2022   CL 102 01/13/2022   CO2 22 01/13/2022   Lab Results  Component Value Date   ALT 16 01/13/2022   AST 17 01/13/2022   ALKPHOS 94 01/13/2022   BILITOT 0.6 01/13/2022   Lab Results  Component Value Date   HGBA1C 5.3 01/13/2022   HGBA1C 5.3 03/25/2021   HGBA1C 5.4 08/27/2020   HGBA1C 5.1 03/05/2020   HGBA1C 5.3 09/26/2019   Lab Results  Component Value Date   INSULIN 11.2 01/13/2022   INSULIN 10.0 03/25/2021   INSULIN 17.1 08/27/2020   INSULIN 12.3 03/05/2020   INSULIN 12.3 09/26/2019   Lab Results  Component Value Date   TSH 1.390 01/13/2022   Lab Results  Component Value Date   CHOL 221 (H) 01/13/2022   HDL 58 01/13/2022   LDLCALC 139 (H) 01/13/2022   TRIG 134 01/13/2022   CHOLHDL 7.3 03/07/2008   Lab Results  Component Value Date   VD25OH 78.4 01/13/2022   VD25OH 42.8 03/25/2021   VD25OH 49.1 08/27/2020   Lab Results  Component Value Date   WBC 4.8 01/13/2022   HGB 14.2 01/13/2022   HCT 44.1 01/13/2022   MCV 97 01/13/2022   PLT 155 01/13/2022   Lab Results  Component Value Date   IRON 76 07/21/2008   TIBC 295 07/21/2008   FERRITIN 219 07/21/2008   Attestation Statements:   Reviewed by  clinician on day of visit: allergies, medications, problem list, medical history, surgical history, family history, social history, and previous encounter notes.  Time spent on visit including pre-visit chart review and post-visit care and charting was 30 minutes.   I, Trixie Dredge, am acting as transcriptionist for Dennard Nip, MD.  I have reviewed the above documentation for accuracy and completeness, and I agree with the above. -  Dennard Nip, MD

## 2022-07-21 ENCOUNTER — Ambulatory Visit (INDEPENDENT_AMBULATORY_CARE_PROVIDER_SITE_OTHER): Payer: Medicare Other | Admitting: Family Medicine

## 2022-08-18 ENCOUNTER — Ambulatory Visit (INDEPENDENT_AMBULATORY_CARE_PROVIDER_SITE_OTHER): Payer: Medicare Other | Admitting: Family Medicine

## 2022-09-22 ENCOUNTER — Ambulatory Visit (INDEPENDENT_AMBULATORY_CARE_PROVIDER_SITE_OTHER): Payer: Medicare Other | Admitting: Family Medicine

## 2022-09-22 ENCOUNTER — Other Ambulatory Visit (INDEPENDENT_AMBULATORY_CARE_PROVIDER_SITE_OTHER): Payer: Self-pay | Admitting: Family Medicine

## 2022-09-22 ENCOUNTER — Telehealth (INDEPENDENT_AMBULATORY_CARE_PROVIDER_SITE_OTHER): Payer: Self-pay | Admitting: Family Medicine

## 2022-09-22 ENCOUNTER — Encounter (INDEPENDENT_AMBULATORY_CARE_PROVIDER_SITE_OTHER): Payer: Self-pay | Admitting: Family Medicine

## 2022-09-22 VITALS — BP 113/69 | HR 65 | Temp 97.4°F | Ht 61.0 in | Wt 352.0 lb

## 2022-09-22 DIAGNOSIS — Z6841 Body Mass Index (BMI) 40.0 and over, adult: Secondary | ICD-10-CM

## 2022-09-22 DIAGNOSIS — I10 Essential (primary) hypertension: Secondary | ICD-10-CM | POA: Diagnosis not present

## 2022-09-22 DIAGNOSIS — F439 Reaction to severe stress, unspecified: Secondary | ICD-10-CM | POA: Diagnosis not present

## 2022-09-22 DIAGNOSIS — E559 Vitamin D deficiency, unspecified: Secondary | ICD-10-CM

## 2022-09-22 DIAGNOSIS — E669 Obesity, unspecified: Secondary | ICD-10-CM | POA: Diagnosis not present

## 2022-09-22 MED ORDER — VITAMIN D (ERGOCALCIFEROL) 1.25 MG (50000 UNIT) PO CAPS: ORAL_CAPSULE | ORAL | 0 refills | Status: DC

## 2022-09-22 MED ORDER — ESCITALOPRAM OXALATE 5 MG PO TABS: 5.0000 mg | ORAL_TABLET | Freq: Every day | ORAL | 0 refills | Status: DC

## 2022-09-22 MED ORDER — LISINOPRIL 10 MG PO TABS: 10.0000 mg | ORAL_TABLET | Freq: Every day | ORAL | 0 refills | Status: DC

## 2022-09-22 NOTE — Telephone Encounter (Signed)
She hasnt received the prescription for lexapro and was wondering if Dr. Dalbert Garnet had a chance to put it in. JE

## 2022-09-29 ENCOUNTER — Encounter (INDEPENDENT_AMBULATORY_CARE_PROVIDER_SITE_OTHER): Payer: Self-pay | Admitting: Family Medicine

## 2022-10-06 NOTE — Progress Notes (Signed)
Chief Complaint:   OBESITY Maria Stone is here to discuss her progress with her obesity treatment plan along with follow-up of her obesity related diagnoses. Maria Stone is on the Category 3 Plan and states she is following her eating plan approximately 50% of the time. Maria Stone states she is has been moving around more.    Today's visit was #: 48 Starting weight: 393 lbs Starting date: 12/31/2019 Today's weight: 352 lbs Today's date: 09/22/2022 Total lbs lost to date: 41 Total lbs lost since last in-office visit: 0  Interim History: Maria Stone has been stressed with her own health issues and her family's health.  She will be doing a big family meal for Thanksgiving.  Subjective:   1. Stress Maria Stone has had a lot of stress and she is feeling overwhelmed at times.  2. Vitamin D deficiency Maria Stone is on vitamin D with no side effects noted.  3. Essential hypertension Maria Stone's blood pressure is stable on her medications, and she denies chest pain or shortness of breath.  Assessment/Plan:   1. Stress Ecko agreed to start Lexapro 10 mg q AM with no refills. We will follow-up at her next visit.   - escitalopram (LEXAPRO) 5 MG tablet; Take 1 tablet (5 mg total) by mouth daily.  Dispense: 30 tablet; Refill: 0  2. Vitamin D deficiency We will refill prescription Vitamin D for 1 month. [name] will follow-up for routine testing of Vitamin D, at least 2-3 times per year to avoid over-replacement.  - Vitamin D, Ergocalciferol, (DRISDOL) 1.25 MG (50000 UNIT) CAPS capsule; TAKE 1 CAPSULE BY MOUTH EVERY 7 DAYS  Dispense: 4 capsule; Refill: 0  3. Essential hypertension We will refill lisinopril for 1 month. [name] will continue working on healthy weight loss and exercise to improve blood pressure control. We will watch for signs of hypotension as she continues her lifestyle modifications.  - lisinopril (ZESTRIL) 10 MG tablet; Take 1 tablet (10 mg total) by mouth daily.  Dispense:  30 tablet; Refill: 0  4. Obesity, Current BMI 66.5 Maria Stone is currently in the action stage of change. As such, her goal is to continue with weight loss efforts. She has agreed to the Category 3 Plan.   Exercise goals: As is.   Behavioral modification strategies: holiday eating strategies .  Maria Stone has agreed to follow-up with our clinic in 3 to 4 weeks. She was informed of the importance of frequent follow-up visits to maximize her success with intensive lifestyle modifications for her multiple health conditions.   Objective:   Blood pressure 113/69, pulse 65, temperature (!) 97.4 F (36.3 C), height 5\' 1"  (1.549 m), weight (!) 352 lb (159.7 kg), SpO2 100 %. Body mass index is 66.51 kg/m.  General: Cooperative, alert, well developed, in no acute distress. HEENT: Conjunctivae and lids unremarkable. Cardiovascular: Regular rhythm.  Lungs: Normal work of breathing. Neurologic: No focal deficits.   Lab Results  Component Value Date   CREATININE 0.9 05/15/2022   BUN 29 (A) 05/15/2022   NA 142 05/15/2022   K 5.0 05/15/2022   CL 105 05/15/2022   CO2 31 (A) 05/15/2022   Lab Results  Component Value Date   ALT 13 05/15/2022   AST 18 05/15/2022   ALKPHOS 98 05/15/2022   BILITOT 0.6 01/13/2022   Lab Results  Component Value Date   HGBA1C 5.2 05/15/2022   HGBA1C 5.3 01/13/2022   HGBA1C 5.3 03/25/2021   HGBA1C 5.4 08/27/2020   HGBA1C 5.1 03/05/2020   Lab Results  Component Value Date   INSULIN 11.2 01/13/2022   INSULIN 10.0 03/25/2021   INSULIN 17.1 08/27/2020   INSULIN 12.3 03/05/2020   INSULIN 12.3 09/26/2019   Lab Results  Component Value Date   TSH 1.25 05/15/2022   Lab Results  Component Value Date   CHOL 233 (A) 05/15/2022   HDL 48 05/15/2022   LDLCALC 160 05/15/2022   TRIG 124 05/15/2022   CHOLHDL 7.3 03/07/2008   Lab Results  Component Value Date   VD25OH 28.0 05/15/2022   VD25OH 78.4 01/13/2022   VD25OH 42.8 03/25/2021   Lab Results   Component Value Date   WBC 4.8 05/15/2022   HGB 14.1 05/15/2022   HCT 42 05/15/2022   MCV 97 01/13/2022   PLT 143 (A) 05/15/2022   Lab Results  Component Value Date   IRON 76 07/21/2008   TIBC 295 07/21/2008   FERRITIN 219 07/21/2008   Attestation Statements:   Reviewed by clinician on day of visit: allergies, medications, problem list, medical history, surgical history, family history, social history, and previous encounter notes.  I have personally spent 40 minutes total time today in preparation, patient care, and documentation for this visit, including the following: review of clinical lab tests; review of medical tests/procedures/services.   I, Burt Knack, am acting as transcriptionist for Quillian Quince, MD.  I have reviewed the above documentation for accuracy and completeness, and I agree with the above. -  Quillian Quince, MD

## 2022-10-12 ENCOUNTER — Other Ambulatory Visit (INDEPENDENT_AMBULATORY_CARE_PROVIDER_SITE_OTHER): Payer: Self-pay | Admitting: Family Medicine

## 2022-10-12 DIAGNOSIS — E559 Vitamin D deficiency, unspecified: Secondary | ICD-10-CM

## 2022-10-14 ENCOUNTER — Other Ambulatory Visit (INDEPENDENT_AMBULATORY_CARE_PROVIDER_SITE_OTHER): Payer: Self-pay | Admitting: Family Medicine

## 2022-10-14 DIAGNOSIS — F439 Reaction to severe stress, unspecified: Secondary | ICD-10-CM

## 2022-10-18 ENCOUNTER — Other Ambulatory Visit (INDEPENDENT_AMBULATORY_CARE_PROVIDER_SITE_OTHER): Payer: Self-pay | Admitting: Family Medicine

## 2022-10-18 DIAGNOSIS — F439 Reaction to severe stress, unspecified: Secondary | ICD-10-CM

## 2022-10-22 ENCOUNTER — Ambulatory Visit (INDEPENDENT_AMBULATORY_CARE_PROVIDER_SITE_OTHER): Payer: Medicare Other | Admitting: Family Medicine

## 2022-10-22 ENCOUNTER — Encounter (INDEPENDENT_AMBULATORY_CARE_PROVIDER_SITE_OTHER): Payer: Self-pay | Admitting: Family Medicine

## 2022-10-22 VITALS — BP 156/83 | HR 66 | Temp 98.6°F | Ht 61.0 in | Wt 348.0 lb

## 2022-10-22 DIAGNOSIS — E559 Vitamin D deficiency, unspecified: Secondary | ICD-10-CM | POA: Diagnosis not present

## 2022-10-22 DIAGNOSIS — F439 Reaction to severe stress, unspecified: Secondary | ICD-10-CM | POA: Diagnosis not present

## 2022-10-22 DIAGNOSIS — F418 Other specified anxiety disorders: Secondary | ICD-10-CM

## 2022-10-22 DIAGNOSIS — E669 Obesity, unspecified: Secondary | ICD-10-CM

## 2022-10-22 DIAGNOSIS — Z6841 Body Mass Index (BMI) 40.0 and over, adult: Secondary | ICD-10-CM

## 2022-10-22 DIAGNOSIS — I10 Essential (primary) hypertension: Secondary | ICD-10-CM

## 2022-10-22 MED ORDER — LISINOPRIL 10 MG PO TABS: 10.0000 mg | ORAL_TABLET | Freq: Every day | ORAL | 0 refills | Status: DC

## 2022-10-22 MED ORDER — ESCITALOPRAM OXALATE 10 MG PO TABS: 10.0000 mg | ORAL_TABLET | Freq: Every day | ORAL | 0 refills | Status: DC

## 2022-10-22 MED ORDER — VITAMIN D (ERGOCALCIFEROL) 1.25 MG (50000 UNIT) PO CAPS: ORAL_CAPSULE | ORAL | 0 refills | Status: DC

## 2022-11-03 ENCOUNTER — Other Ambulatory Visit (INDEPENDENT_AMBULATORY_CARE_PROVIDER_SITE_OTHER): Payer: Self-pay | Admitting: Family Medicine

## 2022-11-03 DIAGNOSIS — E559 Vitamin D deficiency, unspecified: Secondary | ICD-10-CM
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
## 2022-11-07 ENCOUNTER — Other Ambulatory Visit (INDEPENDENT_AMBULATORY_CARE_PROVIDER_SITE_OTHER): Payer: Self-pay | Admitting: Family Medicine

## 2022-11-07 DIAGNOSIS — F418 Other specified anxiety disorders: Secondary | ICD-10-CM

## 2022-11-27 ENCOUNTER — Encounter (INDEPENDENT_AMBULATORY_CARE_PROVIDER_SITE_OTHER): Payer: Self-pay | Admitting: Family Medicine

## 2022-11-27 ENCOUNTER — Ambulatory Visit (INDEPENDENT_AMBULATORY_CARE_PROVIDER_SITE_OTHER): Payer: Medicare Other | Admitting: Family Medicine

## 2022-11-27 VITALS — BP 143/83 | HR 70 | Temp 97.7°F | Ht 61.0 in | Wt 349.0 lb

## 2022-11-27 DIAGNOSIS — Z6841 Body Mass Index (BMI) 40.0 and over, adult: Secondary | ICD-10-CM | POA: Diagnosis not present

## 2022-11-27 DIAGNOSIS — F418 Other specified anxiety disorders: Secondary | ICD-10-CM

## 2022-11-27 DIAGNOSIS — E669 Obesity, unspecified: Secondary | ICD-10-CM | POA: Diagnosis not present

## 2022-11-27 MED ORDER — ESCITALOPRAM OXALATE 10 MG PO TABS: 10.0000 mg | ORAL_TABLET | Freq: Every day | ORAL | 0 refills | Status: DC

## 2022-12-02 NOTE — Progress Notes (Unsigned)
Chief Complaint:   OBESITY Maria Stone is here to discuss her progress with her obesity treatment plan along with follow-up of her obesity related diagnoses. Maria Stone is on {MWMwtlossportion/plan2:23431} and states she is following her eating plan approximately ***% of the time. Maria Stone states she is *** *** minutes *** times per week.  Today's visit was #: *** Starting weight: *** Starting date: *** Today's weight: *** Today's date: 11/27/2022 Total lbs lost to date: *** Total lbs lost since last in-office visit: ***  Interim History: ***  Subjective:   1. Emotional Eating Behavior ***  Assessment/Plan:   1. Emotional Eating Behavior *** - escitalopram (LEXAPRO) 10 MG tablet; Take 1 tablet (10 mg total) by mouth daily.  Dispense: 30 tablet; Refill: 0  2. BMI 60.0-69.9, adult (HCC)  3. Obesity, Beginning BMI 74.26 Maria Stone is currently in the action stage of change. As such, her goal is to continue with weight loss efforts. She has agreed to change to following a lower carbohydrate, vegetable and lean protein rich diet plan.   Behavioral modification strategies: increasing lean protein intake and increasing water intake.  Maria Stone has agreed to follow-up with our clinic in 4 weeks. She was informed of the importance of frequent follow-up visits to maximize her success with intensive lifestyle modifications for her multiple health conditions.   Objective:   Blood pressure (!) 143/83, pulse 70, temperature 97.7 F (36.5 C), height 5\' 1"  (1.549 m), weight (!) 349 lb (158.3 kg), SpO2 94 %. Body mass index is 65.94 kg/m.  General: Cooperative, alert, well developed, in no acute distress. HEENT: Conjunctivae and lids unremarkable. Cardiovascular: Regular rhythm.  Lungs: Normal work of breathing. Neurologic: No focal deficits.   Lab Results  Component Value Date   CREATININE 0.9 05/15/2022   BUN 29 (A) 05/15/2022   NA 142 05/15/2022   K 5.0 05/15/2022   CL 105  05/15/2022   CO2 31 (A) 05/15/2022   Lab Results  Component Value Date   ALT 13 05/15/2022   AST 18 05/15/2022   ALKPHOS 98 05/15/2022   BILITOT 0.6 01/13/2022   Lab Results  Component Value Date   HGBA1C 5.2 05/15/2022   HGBA1C 5.3 01/13/2022   HGBA1C 5.3 03/25/2021   HGBA1C 5.4 08/27/2020   HGBA1C 5.1 03/05/2020   Lab Results  Component Value Date   INSULIN 11.2 01/13/2022   INSULIN 10.0 03/25/2021   INSULIN 17.1 08/27/2020   INSULIN 12.3 03/05/2020   INSULIN 12.3 09/26/2019   Lab Results  Component Value Date   TSH 1.25 05/15/2022   Lab Results  Component Value Date   CHOL 233 (A) 05/15/2022   HDL 48 05/15/2022   LDLCALC 160 05/15/2022   TRIG 124 05/15/2022   CHOLHDL 7.3 03/07/2008   Lab Results  Component Value Date   VD25OH 28.0 05/15/2022   VD25OH 78.4 01/13/2022   VD25OH 42.8 03/25/2021   Lab Results  Component Value Date   WBC 4.8 05/15/2022   HGB 14.1 05/15/2022   HCT 42 05/15/2022   MCV 97 01/13/2022   PLT 143 (A) 05/15/2022   Lab Results  Component Value Date   IRON 76 07/21/2008   TIBC 295 07/21/2008   FERRITIN 219 07/21/2008   Attestation Statements:   Reviewed by clinician on day of visit: allergies, medications, problem list, medical history, surgical history, family history, social history, and previous encounter notes.  Time spent on visit including pre-visit chart review and post-visit care and charting was 30 minutes.  I, Trixie Dredge, am acting as transcriptionist for Dennard Nip, MD.  I have reviewed the above documentation for accuracy and completeness, and I agree with the above. -  ***

## 2022-12-19 ENCOUNTER — Other Ambulatory Visit (INDEPENDENT_AMBULATORY_CARE_PROVIDER_SITE_OTHER): Payer: Self-pay | Admitting: Family Medicine

## 2022-12-19 DIAGNOSIS — F418 Other specified anxiety disorders: Secondary | ICD-10-CM

## 2022-12-24 ENCOUNTER — Ambulatory Visit (INDEPENDENT_AMBULATORY_CARE_PROVIDER_SITE_OTHER): Payer: Medicare Other | Admitting: Family Medicine

## 2022-12-24 ENCOUNTER — Encounter (INDEPENDENT_AMBULATORY_CARE_PROVIDER_SITE_OTHER): Payer: Self-pay | Admitting: Family Medicine

## 2022-12-24 VITALS — BP 136/77 | HR 71 | Temp 98.1°F | Ht 61.0 in | Wt 346.0 lb

## 2022-12-24 DIAGNOSIS — E669 Obesity, unspecified: Secondary | ICD-10-CM

## 2022-12-24 DIAGNOSIS — F418 Other specified anxiety disorders: Secondary | ICD-10-CM

## 2022-12-24 DIAGNOSIS — E7849 Other hyperlipidemia: Secondary | ICD-10-CM | POA: Diagnosis not present

## 2022-12-24 DIAGNOSIS — Z6841 Body Mass Index (BMI) 40.0 and over, adult: Secondary | ICD-10-CM | POA: Diagnosis not present

## 2022-12-24 MED ORDER — FLUOXETINE HCL 20 MG PO TABS: 20.0000 mg | ORAL_TABLET | Freq: Three times a day (TID) | ORAL | 0 refills | Status: DC

## 2023-01-07 NOTE — Progress Notes (Signed)
Chief Complaint:   OBESITY Maria Stone is here to discuss her progress with her obesity treatment plan along with follow-up of her obesity related diagnoses. Maria Stone is on following a lower carbohydrate, vegetable and lean protein rich diet plan and states she is following her eating plan approximately 75% of the time. Maria Stone states she is has been moving around more.   Today's visit was #: 33 Starting weight: 393 lbs Starting date: 12/30/2018 Today's weight: 346 lbs Today's date: 12/24/2022 Total lbs lost to date: 63 Total lbs lost since last in-office visit: 3  Interim History: Maria Stone continues to do very well with her weight loss.  She was decreasing carbohydrates but this has been difficult.  She feels she will do better on her category 3 plan.  Subjective:   1. Other hyperlipidemia Maria Stone is on Crestor and she is working on her diet and weight loss.  No chest pain or myalgias were noted.  2. Emotional Eating Behavior Maria Stone was on both Lexapro and Prozac, and she had been doing well but she noticed tingling in her fingers.  She stopped the Lexapro and the tingling has resolved but her mood has decreased.  She also is under increased stress right now.  Assessment/Plan:   1. Other hyperlipidemia Maria Stone will continue with her diet and Crestor, and we will recheck labs in 1 to 2 months.  2. Emotional Eating Behavior Maria Stone agreed to discontinue Lexapro, and increase Prozac to 60 mg once daily.  We will follow-up at her next visit in 3 to 4 weeks.  - FLUoxetine (PROZAC) 20 MG tablet; Take 1 tablet (20 mg total) by mouth 3 (three) times daily.  Dispense: 90 tablet; Refill: 0  3. BMI 60.0-69.9, adult (HCC)  4. Obesity, Beginning BMI 74.26 Maria Stone is currently in the action stage of change. As such, her goal is to continue with weight loss efforts. She has agreed to the Category 3 Plan.   Behavioral modification strategies: increasing lean protein intake, meal  planning and cooking strategies, and dealing with family or coworker sabotage.  Maria Stone has agreed to follow-up with our clinic in 3 to 4 weeks. She was informed of the importance of frequent follow-up visits to maximize her success with intensive lifestyle modifications for her multiple health conditions.   Objective:   Blood pressure 136/77, pulse 71, temperature 98.1 F (36.7 C), height '5\' 1"'$  (1.549 m), weight (!) 346 lb (156.9 kg), SpO2 95 %. Body mass index is 65.38 kg/m.  Lab Results  Component Value Date   CREATININE 0.9 05/15/2022   BUN 29 (A) 05/15/2022   NA 142 05/15/2022   K 5.0 05/15/2022   CL 105 05/15/2022   CO2 31 (A) 05/15/2022   Lab Results  Component Value Date   ALT 13 05/15/2022   AST 18 05/15/2022   ALKPHOS 98 05/15/2022   BILITOT 0.6 01/13/2022   Lab Results  Component Value Date   HGBA1C 5.2 05/15/2022   HGBA1C 5.3 01/13/2022   HGBA1C 5.3 03/25/2021   HGBA1C 5.4 08/27/2020   HGBA1C 5.1 03/05/2020   Lab Results  Component Value Date   INSULIN 11.2 01/13/2022   INSULIN 10.0 03/25/2021   INSULIN 17.1 08/27/2020   INSULIN 12.3 03/05/2020   INSULIN 12.3 09/26/2019   Lab Results  Component Value Date   TSH 1.25 05/15/2022   Lab Results  Component Value Date   CHOL 233 (A) 05/15/2022   HDL 48 05/15/2022   LDLCALC 160 05/15/2022   TRIG  124 05/15/2022   CHOLHDL 7.3 03/07/2008   Lab Results  Component Value Date   VD25OH 28.0 05/15/2022   VD25OH 78.4 01/13/2022   VD25OH 42.8 03/25/2021   Lab Results  Component Value Date   WBC 4.8 05/15/2022   HGB 14.1 05/15/2022   HCT 42 05/15/2022   MCV 97 01/13/2022   PLT 143 (A) 05/15/2022   Lab Results  Component Value Date   IRON 76 07/21/2008   TIBC 295 07/21/2008   FERRITIN 219 07/21/2008   Attestation Statements:   Reviewed by clinician on day of visit: allergies, medications, problem list, medical history, surgical history, family history, social history, and previous encounter  notes.  I have personally spent 44 minutes total time today in preparation, patient care, and documentation for this visit, including the following: review of clinical lab tests; review of medical tests/procedures/services.   I, Trixie Dredge, am acting as transcriptionist for Dennard Nip, MD.  I have reviewed the above documentation for accuracy and completeness, and I agree with the above. -  Dennard Nip, MD

## 2023-01-15 ENCOUNTER — Other Ambulatory Visit (INDEPENDENT_AMBULATORY_CARE_PROVIDER_SITE_OTHER): Payer: Self-pay | Admitting: Family Medicine

## 2023-01-15 DIAGNOSIS — F418 Other specified anxiety disorders: Secondary | ICD-10-CM

## 2023-01-22 ENCOUNTER — Encounter (INDEPENDENT_AMBULATORY_CARE_PROVIDER_SITE_OTHER): Payer: Self-pay | Admitting: Family Medicine

## 2023-01-22 ENCOUNTER — Ambulatory Visit (INDEPENDENT_AMBULATORY_CARE_PROVIDER_SITE_OTHER): Payer: Medicare Other | Admitting: Family Medicine

## 2023-01-22 VITALS — BP 123/72 | HR 75 | Temp 98.3°F | Ht 61.0 in | Wt 341.0 lb

## 2023-01-22 DIAGNOSIS — E669 Obesity, unspecified: Secondary | ICD-10-CM | POA: Diagnosis not present

## 2023-01-22 DIAGNOSIS — Z6841 Body Mass Index (BMI) 40.0 and over, adult: Secondary | ICD-10-CM | POA: Diagnosis not present

## 2023-01-22 DIAGNOSIS — E559 Vitamin D deficiency, unspecified: Secondary | ICD-10-CM

## 2023-01-22 DIAGNOSIS — F418 Other specified anxiety disorders: Secondary | ICD-10-CM | POA: Diagnosis not present

## 2023-01-22 MED ORDER — VITAMIN D (ERGOCALCIFEROL) 1.25 MG (50000 UNIT) PO CAPS: ORAL_CAPSULE | ORAL | 0 refills | Status: DC

## 2023-01-22 MED ORDER — FLUOXETINE HCL 20 MG PO TABS: 20.0000 mg | ORAL_TABLET | Freq: Three times a day (TID) | ORAL | 0 refills | Status: DC

## 2023-01-28 NOTE — Progress Notes (Signed)
Chief Complaint:   OBESITY Maria Stone is here to discuss her progress with her obesity treatment plan along with follow-up of her obesity related diagnoses. Maria Stone is on the Category 3 Plan and states she is following her eating plan approximately 80% of the time. Maria Stone states she is doing 0 minutes 0 times per week.  Today's visit was #: 38 Starting weight: 393 lbs Starting date: 12/30/2018 Today's weight: 341 lbs Today's date: 01/22/2023 Total lbs lost to date: 52 Total lbs lost since last in-office visit: 5  Interim History: Maria Stone has done better with her weight loss efforts.  She is getting a little help with caring for her 15 year old mother, and she hopes this will give her more time to meal plan and prep.  Her hunger is controlled.  Subjective:   1. Vitamin D deficiency Maria Stone is stable on vitamin D, and her fatigue has improved.  2. Emotional Eating Behavior Maria Stone mood appears to be good.  She is tolerating Prozac with no side effects.  She is looking forward to improved health with continued weight loss.  Assessment/Plan:   1. Vitamin D deficiency Maria Stone will continue prescription vitamin D, and we will refill for 1 month.  - Vitamin D, Ergocalciferol, (DRISDOL) 1.25 MG (50000 UNIT) CAPS capsule; TAKE 1 CAPSULE BY MOUTH EVERY 7 DAYS  Dispense: 4 capsule; Refill: 0  2. Emotional Eating Behavior Maria Stone will continue Prozac, and we will refill for 1 month.  - FLUoxetine (PROZAC) 20 MG tablet; Take 1 tablet (20 mg total) by mouth 3 (three) times daily.  Dispense: 90 tablet; Refill: 0  3. BMI 60.0-69.9, adult (HCC)  4. Obesity, Beginning BMI 74.26 Maria Stone is currently in the action stage of change. As such, her goal is to continue with weight loss efforts. She has agreed to the Category 3 Plan.   Exercise goals: Increase cardio and chair exercise.   Behavioral modification strategies: meal planning and cooking strategies.  Maria Stone has agreed to  follow-up with our clinic in 4 weeks. She was informed of the importance of frequent follow-up visits to maximize her success with intensive lifestyle modifications for her multiple health conditions.   Objective:   Blood pressure 123/72, pulse 75, temperature 98.3 F (36.8 C), height 5\' 1"  (1.549 m), weight (!) 341 lb (154.7 kg), SpO2 95 %. Body mass index is 64.43 kg/m.  Lab Results  Component Value Date   CREATININE 0.9 05/15/2022   BUN 29 (A) 05/15/2022   NA 142 05/15/2022   K 5.0 05/15/2022   CL 105 05/15/2022   CO2 31 (A) 05/15/2022   Lab Results  Component Value Date   ALT 13 05/15/2022   AST 18 05/15/2022   ALKPHOS 98 05/15/2022   BILITOT 0.6 01/13/2022   Lab Results  Component Value Date   HGBA1C 5.2 05/15/2022   HGBA1C 5.3 01/13/2022   HGBA1C 5.3 03/25/2021   HGBA1C 5.4 08/27/2020   HGBA1C 5.1 03/05/2020   Lab Results  Component Value Date   INSULIN 11.2 01/13/2022   INSULIN 10.0 03/25/2021   INSULIN 17.1 08/27/2020   INSULIN 12.3 03/05/2020   INSULIN 12.3 09/26/2019   Lab Results  Component Value Date   TSH 1.25 05/15/2022   Lab Results  Component Value Date   CHOL 233 (A) 05/15/2022   HDL 48 05/15/2022   LDLCALC 160 05/15/2022   TRIG 124 05/15/2022   CHOLHDL 7.3 03/07/2008   Lab Results  Component Value Date   VD25OH 28.0 05/15/2022  VD25OH 78.4 01/13/2022   VD25OH 42.8 03/25/2021   Lab Results  Component Value Date   WBC 4.8 05/15/2022   HGB 14.1 05/15/2022   HCT 42 05/15/2022   MCV 97 01/13/2022   PLT 143 (A) 05/15/2022   Lab Results  Component Value Date   IRON 76 07/21/2008   TIBC 295 07/21/2008   FERRITIN 219 07/21/2008   Attestation Statements:   Reviewed by clinician on day of visit: allergies, medications, problem list, medical history, surgical history, family history, social history, and previous encounter notes.   I, Trixie Dredge, am acting as transcriptionist for Dennard Nip, MD.  I have reviewed the above  documentation for accuracy and completeness, and I agree with the above. -  Dennard Nip, MD

## 2023-02-15 ENCOUNTER — Other Ambulatory Visit (INDEPENDENT_AMBULATORY_CARE_PROVIDER_SITE_OTHER): Payer: Self-pay | Admitting: Family Medicine

## 2023-02-15 DIAGNOSIS — E559 Vitamin D deficiency, unspecified: Secondary | ICD-10-CM

## 2023-02-17 ENCOUNTER — Other Ambulatory Visit (INDEPENDENT_AMBULATORY_CARE_PROVIDER_SITE_OTHER): Payer: Self-pay | Admitting: Family Medicine

## 2023-02-17 DIAGNOSIS — F418 Other specified anxiety disorders: Secondary | ICD-10-CM

## 2023-02-21 ENCOUNTER — Other Ambulatory Visit (INDEPENDENT_AMBULATORY_CARE_PROVIDER_SITE_OTHER): Payer: Self-pay | Admitting: Family Medicine

## 2023-02-21 DIAGNOSIS — E559 Vitamin D deficiency, unspecified: Secondary | ICD-10-CM

## 2023-02-25 ENCOUNTER — Ambulatory Visit (INDEPENDENT_AMBULATORY_CARE_PROVIDER_SITE_OTHER): Payer: Medicare Other | Admitting: Family Medicine

## 2023-03-08 ENCOUNTER — Other Ambulatory Visit (INDEPENDENT_AMBULATORY_CARE_PROVIDER_SITE_OTHER): Payer: Self-pay | Admitting: Family Medicine

## 2023-03-08 DIAGNOSIS — F418 Other specified anxiety disorders: Secondary | ICD-10-CM

## 2023-03-25 ENCOUNTER — Ambulatory Visit (INDEPENDENT_AMBULATORY_CARE_PROVIDER_SITE_OTHER): Payer: Medicare Other | Admitting: Family Medicine

## 2023-03-25 ENCOUNTER — Encounter (INDEPENDENT_AMBULATORY_CARE_PROVIDER_SITE_OTHER): Payer: Self-pay | Admitting: Family Medicine

## 2023-03-25 VITALS — BP 118/63 | HR 77 | Temp 98.3°F | Ht 61.0 in | Wt 341.0 lb

## 2023-03-25 DIAGNOSIS — R600 Localized edema: Secondary | ICD-10-CM | POA: Diagnosis not present

## 2023-03-25 DIAGNOSIS — Z6841 Body Mass Index (BMI) 40.0 and over, adult: Secondary | ICD-10-CM

## 2023-03-25 DIAGNOSIS — R4689 Other symptoms and signs involving appearance and behavior: Secondary | ICD-10-CM | POA: Diagnosis not present

## 2023-03-25 DIAGNOSIS — E669 Obesity, unspecified: Secondary | ICD-10-CM

## 2023-03-25 DIAGNOSIS — E559 Vitamin D deficiency, unspecified: Secondary | ICD-10-CM | POA: Diagnosis not present

## 2023-03-25 DIAGNOSIS — F418 Other specified anxiety disorders: Secondary | ICD-10-CM

## 2023-03-25 MED ORDER — FUROSEMIDE 20 MG PO TABS: ORAL_TABLET | ORAL | 0 refills | Status: DC

## 2023-03-25 MED ORDER — VITAMIN D (ERGOCALCIFEROL) 1.25 MG (50000 UNIT) PO CAPS
ORAL_CAPSULE | ORAL | 0 refills | Status: DC
Start: 2023-03-25 — End: 2023-04-23

## 2023-03-25 MED ORDER — FLUOXETINE HCL 20 MG PO TABS
20.0000 mg | ORAL_TABLET | Freq: Three times a day (TID) | ORAL | 0 refills | Status: DC
Start: 2023-03-25 — End: 2023-04-23

## 2023-03-28 ENCOUNTER — Other Ambulatory Visit (INDEPENDENT_AMBULATORY_CARE_PROVIDER_SITE_OTHER): Payer: Self-pay | Admitting: Family Medicine

## 2023-03-28 DIAGNOSIS — I10 Essential (primary) hypertension: Secondary | ICD-10-CM

## 2023-03-30 NOTE — Progress Notes (Unsigned)
Chief Complaint:   OBESITY Maria Stone is here to discuss her progress with her obesity treatment plan along with follow-up of her obesity related diagnoses. Maria Stone is on the Category 3 Plan and states she is following her eating plan approximately 80% of the time. Maria Stone states she has been more active.   Today's visit was #: 53 Starting weight: 393 lbs Starting date: 12/30/2018 Today's weight: 341 lbs Today's date: 03/25/2023 Total lbs lost to date: 52 Total lbs lost since last in-office visit: 0  Interim History: Maria Stone has done well with maintaining her weight loss despite extra challenges with a recent death in her family.  She does not always follow her plan closely, but she is still mindful and makes better choices than she used to.  Subjective:   1. Bilateral lower extremity edema Maria Stone is stable on Lasix, and she is working on her diet and weight loss.  We discussed her elevated BUN and the need to increase hydration.  2. Vitamin D deficiency Maria Stone is on vitamin D prescription, and her last vitamin D level had dropped below goal.  3. Emotional Eating Behavior Maria Stone's mood is better on her current Prozac dose.  She has no signs of serotonin syndrome.  Assessment/Plan:   1. Bilateral lower extremity edema We will refill Lasix at 20 mg for 1 month.  Maria Stone will work on increasing her water intake, and we will follow-up.  - furosemide (LASIX) 20 MG tablet; 1 DAILY AS DIRECTED ORAL ONCE A DAY 90 DAYS ORALLY ONCE A DAY 90 DAYS  Dispense: 30 tablet; Refill: 0  2. Vitamin D deficiency Maria Stone will continue prescription vitamin D, and we will refill for 1 month.  We will recheck labs next month.  - Vitamin D, Ergocalciferol, (DRISDOL) 1.25 MG (50000 UNIT) CAPS capsule; TAKE 1 CAPSULE BY MOUTH EVERY 7 DAYS  Dispense: 4 capsule; Refill: 0  3. Emotional Eating Behavior Maria Stone will continue Prozac at 20 mg 3 times daily, and we will refill for 1 month.   Emotional eating behavior strategies were discussed.  - FLUoxetine (PROZAC) 20 MG tablet; Take 1 tablet (20 mg total) by mouth 3 (three) times daily.  Dispense: 90 tablet; Refill: 0  4. BMI 60.0-69.9, adult (HCC)  5. Obesity, Beginning BMI 74.26 Maria Stone is currently in the action stage of change. As such, her goal is to continue with weight loss efforts. She has agreed to the Category 3 Plan.   Exercise goals: All adults should avoid inactivity. Some physical activity is better than none, and adults who participate in any amount of physical activity gain some health benefits.  Behavioral modification strategies: emotional eating strategies.  Maria Stone has agreed to follow-up with our clinic in 3 to 4 weeks. She was informed of the importance of frequent follow-up visits to maximize her success with intensive lifestyle modifications for her multiple health conditions.   Objective:   Blood pressure 118/63, pulse 77, temperature 98.3 F (36.8 C), height 5\' 1"  (1.549 m), weight (!) 341 lb (154.7 kg), SpO2 93 %. Body mass index is 64.43 kg/m.  Lab Results  Component Value Date   CREATININE 0.9 05/15/2022   BUN 29 (A) 05/15/2022   NA 142 05/15/2022   K 5.0 05/15/2022   CL 105 05/15/2022   CO2 31 (A) 05/15/2022   Lab Results  Component Value Date   ALT 13 05/15/2022   AST 18 05/15/2022   ALKPHOS 98 05/15/2022   BILITOT 0.6 01/13/2022   Lab Results  Component Value Date   HGBA1C 5.2 05/15/2022   HGBA1C 5.3 01/13/2022   HGBA1C 5.3 03/25/2021   HGBA1C 5.4 08/27/2020   HGBA1C 5.1 03/05/2020   Lab Results  Component Value Date   INSULIN 11.2 01/13/2022   INSULIN 10.0 03/25/2021   INSULIN 17.1 08/27/2020   INSULIN 12.3 03/05/2020   INSULIN 12.3 09/26/2019   Lab Results  Component Value Date   TSH 1.25 05/15/2022   Lab Results  Component Value Date   CHOL 233 (A) 05/15/2022   HDL 48 05/15/2022   LDLCALC 160 05/15/2022   TRIG 124 05/15/2022   CHOLHDL 7.3 03/07/2008    Lab Results  Component Value Date   VD25OH 28.0 05/15/2022   VD25OH 78.4 01/13/2022   VD25OH 42.8 03/25/2021   Lab Results  Component Value Date   WBC 4.8 05/15/2022   HGB 14.1 05/15/2022   HCT 42 05/15/2022   MCV 97 01/13/2022   PLT 143 (A) 05/15/2022   Lab Results  Component Value Date   IRON 76 07/21/2008   TIBC 295 07/21/2008   FERRITIN 219 07/21/2008   Attestation Statements:   Reviewed by clinician on day of visit: allergies, medications, problem list, medical history, surgical history, family history, social history, and previous encounter notes.  I have personally spent 42 minutes total time today in preparation, patient care, and documentation for this visit, including the following: review of clinical lab tests; review of medical tests/procedures/services.   I, Burt Knack, am acting as transcriptionist for Quillian Quince, MD.  I have reviewed the above documentation for accuracy and completeness, and I agree with the above. -  Quillian Quince, MD

## 2023-04-21 ENCOUNTER — Other Ambulatory Visit (INDEPENDENT_AMBULATORY_CARE_PROVIDER_SITE_OTHER): Payer: Self-pay | Admitting: Family Medicine

## 2023-04-21 DIAGNOSIS — F418 Other specified anxiety disorders: Secondary | ICD-10-CM

## 2023-04-23 ENCOUNTER — Encounter (INDEPENDENT_AMBULATORY_CARE_PROVIDER_SITE_OTHER): Payer: Self-pay | Admitting: Family Medicine

## 2023-04-23 ENCOUNTER — Ambulatory Visit (INDEPENDENT_AMBULATORY_CARE_PROVIDER_SITE_OTHER): Payer: Medicare Other | Admitting: Family Medicine

## 2023-04-23 VITALS — BP 134/54 | HR 68 | Temp 98.2°F | Ht 61.0 in | Wt 344.0 lb

## 2023-04-23 DIAGNOSIS — E559 Vitamin D deficiency, unspecified: Secondary | ICD-10-CM

## 2023-04-23 DIAGNOSIS — E88819 Insulin resistance, unspecified: Secondary | ICD-10-CM

## 2023-04-23 DIAGNOSIS — F418 Other specified anxiety disorders: Secondary | ICD-10-CM

## 2023-04-23 DIAGNOSIS — Z6841 Body Mass Index (BMI) 40.0 and over, adult: Secondary | ICD-10-CM

## 2023-04-23 DIAGNOSIS — E669 Obesity, unspecified: Secondary | ICD-10-CM

## 2023-04-23 DIAGNOSIS — E860 Dehydration: Secondary | ICD-10-CM

## 2023-04-23 DIAGNOSIS — R5383 Other fatigue: Secondary | ICD-10-CM | POA: Diagnosis not present

## 2023-04-23 DIAGNOSIS — R4689 Other symptoms and signs involving appearance and behavior: Secondary | ICD-10-CM

## 2023-04-23 DIAGNOSIS — R739 Hyperglycemia, unspecified: Secondary | ICD-10-CM

## 2023-04-23 DIAGNOSIS — Z636 Dependent relative needing care at home: Secondary | ICD-10-CM

## 2023-04-23 MED ORDER — FLUOXETINE HCL 20 MG PO TABS: 20.0000 mg | ORAL_TABLET | Freq: Three times a day (TID) | ORAL | 0 refills | Status: DC

## 2023-04-23 MED ORDER — VITAMIN D (ERGOCALCIFEROL) 1.25 MG (50000 UNIT) PO CAPS: ORAL_CAPSULE | ORAL | 0 refills | Status: DC

## 2023-04-23 NOTE — Progress Notes (Signed)
.smr  Office: [phone]  /  Fax: [phone]  WEIGHT SUMMARY AND BIOMETRICS  Anthropometric Measurements Height: 5\' 1"  (1.549 m) Weight: (!) 344 lb (156 kg) BMI (Calculated): 65.03 Weight at Last Visit: 341 lb Weight Lost Since Last Visit: 0 Weight Gained Since Last Visit: 3 lb Starting Weight: 393 lb   Body Composition  Body Fat %: 67.5 % Fat Mass (lbs): 232.8 lbs Muscle Mass (lbs): 106.4 lbs Visceral Fat Rating : 34   Other Clinical Data Fasting: No Labs: No Today's Visit #: 61 Starting Date: 12/30/18    Chief Complaint: OBESITY  Maria Stone is here to discuss her progress with her obesity treatment plan. She is on the the Category 3 Plan and states she is following her eating plan approximately 85 % of the time. She states she is trying to move more for exercise. Discussed the use of AI scribe software for clinical note transcription with the patient, who gave verbal consent to proceed.  History of Present Illness   The patient, who is currently caring for their elderly mother, presents for a routine follow-up. They report feeling fatigued, which they attribute to the stress and physical demands of their caregiving role. The patient is currently on fluoxetine (Prozac), which they believe is helping them cope with their situation. They deny any issues with insomnia.  The patient has a history of insulin resistance, which has been improving. They report a recent weight gain of three pounds, two of which they believe to be "real" weight. They attribute this to a lack of meal preparation and express a desire to improve in this area. They also mention occasional skipping of their 20mg  diuretic medication, which they take for fluid retention.  The patient has an upcoming family wedding, which they are looking forward to, but also express concern about the outdoor setting in the summer heat. They are currently not on any thyroid medication. They have a physical examination  scheduled for the following month.  The patient also inquires about potential weight loss medications, specifically referencing Mounjaro. However, they express concern about the cost and potential side effects of such medications. They also mention a desire for a "magic potion" to aid in weight loss.  The patient is committed to improving their health and is open to suggestions for meal preparation and other lifestyle changes. They express gratitude for their current health status, particularly in relation to their mother's health challenges.          PHYSICAL EXAM:  Blood pressure (!) 134/54, pulse 68, temperature 98.2 F (36.8 C), height 5\' 1"  (1.549 m), weight (!) 344 lb (156 kg), SpO2 96 %. Body mass index is 65 kg/m.  DIAGNOSTIC DATA REVIEWED:  BMET    Component Value Date/Time   NA 142 05/15/2022 0000   K 5.0 05/15/2022 0000   CL 105 05/15/2022 0000   CO2 31 (A) 05/15/2022 0000   GLUCOSE 90 01/13/2022 0729   GLUCOSE 93 01/01/2018 1312   BUN 29 (A) 05/15/2022 0000   CREATININE 0.9 05/15/2022 0000   CREATININE 0.90 01/13/2022 0729   CALCIUM 9.0 05/15/2022 0000   GFRNONAA 55 (L) 10/15/2020 0806   GFRAA 63 10/15/2020 0806   Lab Results  Component Value Date   HGBA1C 5.2 05/15/2022   HGBA1C (L) 03/08/2008    3.6 (NOTE)   The ADA recommends the following therapeutic goals for glycemic   control related to Hgb A1C measurement:   Goal of Therapy:   < 7.0% Hgb A1C  Action Suggested:  > 8.0% Hgb A1C   Ref:  Diabetes Care, 22, Suppl. 1, 1999   Lab Results  Component Value Date   INSULIN 11.2 01/13/2022   INSULIN 16.9 12/30/2018   Lab Results  Component Value Date   TSH 1.25 05/15/2022   CBC    Component Value Date/Time   WBC 4.8 05/15/2022 0000   WBC 6.2 01/01/2018 1215   RBC 4.2 05/15/2022 0000   HGB 14.1 05/15/2022 0000   HGB 14.2 01/13/2022 0729   HGB 15.2 12/12/2008 0804   HCT 42 05/15/2022 0000   HCT 44.1 01/13/2022 0729   HCT 43.8 12/12/2008 0804   PLT  143 (A) 05/15/2022 0000   PLT 155 01/13/2022 0729   MCV 97 01/13/2022 0729   MCV 90.8 12/12/2008 0804   MCH 31.2 01/13/2022 0729   MCH 31.5 01/01/2018 1215   MCHC 32.2 01/13/2022 0729   MCHC 32.0 01/01/2018 1215   RDW 11.7 01/13/2022 0729   RDW 13.6 12/12/2008 0804   Iron Studies    Component Value Date/Time   IRON 76 07/21/2008 0753   TIBC 295 07/21/2008 0753   FERRITIN 219 07/21/2008 0753   IRONPCTSAT 26 07/21/2008 0753   Lipid Panel     Component Value Date/Time   CHOL 233 (A) 05/15/2022 0000   CHOL 221 (H) 01/13/2022 0729   TRIG 124 05/15/2022 0000   HDL 48 05/15/2022 0000   HDL 58 01/13/2022 0729   CHOLHDL 7.3 03/07/2008 0525   VLDL 38 03/07/2008 0525   LDLCALC 160 05/15/2022 0000   LDLCALC 139 (H) 01/13/2022 0729   Hepatic Function Panel     Component Value Date/Time   PROT 6.8 01/13/2022 0729   ALBUMIN 3.6 05/15/2022 0000   ALBUMIN 4.5 01/13/2022 0729   AST 18 05/15/2022 0000   ALT 13 05/15/2022 0000   ALKPHOS 98 05/15/2022 0000   BILITOT 0.6 01/13/2022 0729   BILIDIR 0.6 (H) 03/09/2008 0500   IBILI 4.2 (H) 03/06/2008 1500      Component Value Date/Time   TSH 1.25 05/15/2022 0000   TSH 1.390 01/13/2022 0729   Nutritional Lab Results  Component Value Date   VD25OH 28.0 05/15/2022   VD25OH 78.4 01/13/2022   VD25OH 42.8 03/25/2021     Assessment and Plan    Caregiver Stress: Experiencing fatigue and stress due to caregiving responsibilities for her mother. Currently has assistance for 8 hours a week. -Encouraged self-care and maintaining personal health.  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 Reports that medication is helping manage stress and mood. -Continue Fluoxetine.  Insulin Resistance: Last hemoglobin A1c was 5.2, indicating improvement. -Check hemoglobin A1c, insulin, and complete metabolic panel today.  Vitamin D Deficiency: Last level was low. Currently on supplementation. -Check Vitamin D level  today. -Refill Vitamin D prescription.  Weight Management: Discussed meal prep and healthier eating habits for weight management. -Encouraged continued focus on meal prep and healthier eating habits which were discussed in depth today.  Thyroid Function: Reports feeling fatigued. -Check TSH today.  Dehydration: Reports potential dehydration. -Encouraged increased hydration.  Follow-up: Next physical scheduled for July. -Check kidney function, liver function, electrolytes, and hydration status today. -Plan to discuss results at next physical.         I have personally spent 45 minutes total time today in preparation, patient care, and documentation for this visit, including the following: review of clinical lab tests; review of medical tests/procedures/services.    She was informed of the  importance of frequent follow up visits to maximize her success with intensive lifestyle modifications for her multiple health conditions. No follow-ups on file.   Quillian Quince, MD

## 2023-04-24 LAB — CMP14+EGFR
AST: 20 IU/L (ref 0–40)
Albumin: 4.2 g/dL (ref 3.9–4.9)
Bilirubin Total: 0.4 mg/dL (ref 0.0–1.2)
Calcium: 9.9 mg/dL (ref 8.7–10.3)
Potassium: 4.9 mmol/L (ref 3.5–5.2)

## 2023-04-25 LAB — HEMOGLOBIN A1C
Est. average glucose Bld gHb Est-mCnc: 108 mg/dL
Hgb A1c MFr Bld: 5.4 % (ref 4.8–5.6)

## 2023-04-25 LAB — CMP14+EGFR
ALT: 14 IU/L (ref 0–32)
Albumin/Globulin Ratio: 2
Alkaline Phosphatase: 109 IU/L (ref 44–121)
BUN/Creatinine Ratio: 32 — ABNORMAL HIGH (ref 12–28)
BUN: 41 mg/dL — ABNORMAL HIGH (ref 8–27)
CO2: 25 mmol/L (ref 20–29)
Chloride: 102 mmol/L (ref 96–106)
Creatinine, Ser: 1.3 mg/dL — ABNORMAL HIGH (ref 0.57–1.00)
Globulin, Total: 2.1 g/dL (ref 1.5–4.5)
Glucose: 89 mg/dL (ref 70–99)
Sodium: 142 mmol/L (ref 134–144)
Total Protein: 6.3 g/dL (ref 6.0–8.5)
eGFR: 45 mL/min/{1.73_m2} — ABNORMAL LOW (ref >59)

## 2023-04-25 LAB — VITAMIN B12: Vitamin B-12: 921 pg/mL (ref 232–1245)

## 2023-04-25 LAB — LIPID PANEL WITH LDL/HDL RATIO
Cholesterol, Total: 274 mg/dL — ABNORMAL HIGH (ref 100–199)
HDL: 48 mg/dL (ref >39)
LDL Chol Calc (NIH): 188 mg/dL — ABNORMAL HIGH (ref 0–99)
LDL/HDL Ratio: 3.9 ratio — ABNORMAL HIGH (ref 0.0–3.2)
Triglycerides: 199 mg/dL — ABNORMAL HIGH (ref 0–149)
VLDL Cholesterol Cal: 38 mg/dL (ref 5–40)

## 2023-04-25 LAB — TSH: TSH: 1.37 u[IU]/mL (ref 0.450–4.500)

## 2023-04-25 LAB — VITAMIN D 25 HYDROXY (VIT D DEFICIENCY, FRACTURES): Vit D, 25-Hydroxy: 39.7 ng/mL (ref 30.0–100.0)

## 2023-04-25 LAB — INSULIN, RANDOM: INSULIN: 10.6 u[IU]/mL (ref 2.6–24.9)

## 2023-05-05 ENCOUNTER — Other Ambulatory Visit (INDEPENDENT_AMBULATORY_CARE_PROVIDER_SITE_OTHER): Payer: Self-pay | Admitting: Family Medicine

## 2023-05-05 DIAGNOSIS — E559 Vitamin D deficiency, unspecified: Secondary | ICD-10-CM

## 2023-05-10 ENCOUNTER — Other Ambulatory Visit (INDEPENDENT_AMBULATORY_CARE_PROVIDER_SITE_OTHER): Payer: Self-pay | Admitting: Family Medicine

## 2023-05-10 DIAGNOSIS — F418 Other specified anxiety disorders: Secondary | ICD-10-CM

## 2023-05-10 DIAGNOSIS — E88819 Insulin resistance, unspecified: Secondary | ICD-10-CM

## 2023-05-13 ENCOUNTER — Other Ambulatory Visit (INDEPENDENT_AMBULATORY_CARE_PROVIDER_SITE_OTHER): Payer: Self-pay | Admitting: Family Medicine

## 2023-05-13 DIAGNOSIS — E88819 Insulin resistance, unspecified: Secondary | ICD-10-CM

## 2023-05-13 DIAGNOSIS — F418 Other specified anxiety disorders: Secondary | ICD-10-CM

## 2023-05-28 ENCOUNTER — Ambulatory Visit (INDEPENDENT_AMBULATORY_CARE_PROVIDER_SITE_OTHER): Payer: Medicare Other | Admitting: Family Medicine

## 2023-05-28 ENCOUNTER — Encounter (INDEPENDENT_AMBULATORY_CARE_PROVIDER_SITE_OTHER): Payer: Self-pay | Admitting: Family Medicine

## 2023-05-28 VITALS — BP 152/72 | HR 64 | Temp 98.4°F | Ht 61.0 in | Wt 354.0 lb

## 2023-05-28 DIAGNOSIS — E7849 Other hyperlipidemia: Secondary | ICD-10-CM

## 2023-05-28 DIAGNOSIS — E785 Hyperlipidemia, unspecified: Secondary | ICD-10-CM

## 2023-05-28 DIAGNOSIS — N183 Chronic kidney disease, stage 3 unspecified: Secondary | ICD-10-CM

## 2023-05-28 DIAGNOSIS — F418 Other specified anxiety disorders: Secondary | ICD-10-CM

## 2023-05-28 DIAGNOSIS — N189 Chronic kidney disease, unspecified: Secondary | ICD-10-CM

## 2023-05-28 DIAGNOSIS — N289 Disorder of kidney and ureter, unspecified: Secondary | ICD-10-CM

## 2023-05-28 DIAGNOSIS — E559 Vitamin D deficiency, unspecified: Secondary | ICD-10-CM | POA: Diagnosis not present

## 2023-05-28 DIAGNOSIS — E669 Obesity, unspecified: Secondary | ICD-10-CM

## 2023-05-28 DIAGNOSIS — Z6841 Body Mass Index (BMI) 40.0 and over, adult: Secondary | ICD-10-CM

## 2023-05-28 MED ORDER — FLUOXETINE HCL 20 MG PO TABS
20.0000 mg | ORAL_TABLET | Freq: Three times a day (TID) | ORAL | 0 refills | Status: DC
Start: 2023-05-28 — End: 2023-07-09

## 2023-05-28 MED ORDER — VITAMIN D (ERGOCALCIFEROL) 1.25 MG (50000 UNIT) PO CAPS: ORAL_CAPSULE | ORAL | 0 refills | Status: DC

## 2023-05-28 NOTE — Progress Notes (Signed)
.smr  Office: [phone]  /  Fax: [phone]  WEIGHT SUMMARY AND BIOMETRICS  Anthropometric Measurements Height: 5\' 1"  (1.549 m) Weight: (!) 354 lb (160.6 kg) BMI (Calculated): 66.92 Weight at Last Visit: 344 lb Weight Lost Since Last Visit: 0 Weight Gained Since Last Visit: 10 lb Starting Weight: 393 lb Total Weight Loss (lbs): 0 lb (0 kg)   Body Composition  Body Fat %: 72.3 % Fat Mass (lbs): 256.2 lbs Muscle Mass (lbs): 93.2 lbs Visceral Fat Rating : 37   Other Clinical Data Fasting: No Labs: No Today's Visit #: 55 Starting Date: 12/30/18    Chief Complaint: OBESITY     History of Present Illness   The patient is a 67 year old individual who presents with concerns related to obesity, vitamin D deficiency, and emotional eating behaviors. She reports a recent weight gain of 10 pounds over the past month, despite adhering to their category 3 eating plan approximately 80% of the time. The patient has been attempting to increase their physical activity through walking and house cleaning at least three times per week.  The patient acknowledges recent dietary indiscretions, including consumption of high-calorie foods such as donuts and potato salad. She attributes this to a period of celebration around their birthday and a lack of meal planning. The patient admits to skipping meals, particularly breakfast and lunch, leading to overeating when they do eat. She expresses guilt and frustration over their recent weight gain and dietary choices.  In addition to their struggles with weight management, the patient has a history of a functional heart murmur since birth, but no other cardiac issues. She also report a past issue with their kidneys, which has since resolved and is being monitored by their primary care physician. The patient is currently on Crestor three times a week for high cholesterol and takes CoQ10 as well. They are also on vitamin D and Prozac, with no reported  issues with either medication.  The patient has a family history of diabetes and expresses interest in preventive measures. She is aware of their challenges and express a commitment to improving their meal planning and preparation as a strategy for better weight management.          PHYSICAL EXAM:  Blood pressure (!) 152/72, pulse 64, temperature 98.4 F (36.9 C), height 5\' 1"  (1.549 m), weight (!) 354 lb (160.6 kg), SpO2 97%. Body mass index is 66.89 kg/m.  DIAGNOSTIC DATA REVIEWED:  BMET    Component Value Date/Time   NA 142 04/23/2023 0941   K 4.9 04/23/2023 0941   CL 102 04/23/2023 0941   CO2 25 04/23/2023 0941   GLUCOSE 89 04/23/2023 0941   GLUCOSE 93 01/01/2018 1312   BUN 41 (H) 04/23/2023 0941   CREATININE 1.30 (H) 04/23/2023 0941   CALCIUM 9.9 04/23/2023 0941   GFRNONAA 55 (L) 10/15/2020 0806   GFRAA 63 10/15/2020 0806   Lab Results  Component Value Date   HGBA1C 5.4 04/23/2023   HGBA1C (L) 03/08/2008    3.6 (NOTE)   The ADA recommends the following therapeutic goals for glycemic   control related to Hgb A1C measurement:   Goal of Therapy:   < 7.0% Hgb A1C   Action Suggested:  > 8.0% Hgb A1C   Ref:  Diabetes Care, 22, Suppl. 1, 1999   Lab Results  Component Value Date   INSULIN 10.6 04/23/2023   INSULIN 16.9 12/30/2018   Lab Results  Component Value Date   TSH 1.370 04/23/2023  CBC    Component Value Date/Time   WBC 4.8 05/15/2022 0000   WBC 6.2 01/01/2018 1215   RBC 4.2 05/15/2022 0000   HGB 14.1 05/15/2022 0000   HGB 14.2 01/13/2022 0729   HGB 15.2 12/12/2008 0804   HCT 42 05/15/2022 0000   HCT 44.1 01/13/2022 0729   HCT 43.8 12/12/2008 0804   PLT 143 (A) 05/15/2022 0000   PLT 155 01/13/2022 0729   MCV 97 01/13/2022 0729   MCV 90.8 12/12/2008 0804   MCH 31.2 01/13/2022 0729   MCH 31.5 01/01/2018 1215   MCHC 32.2 01/13/2022 0729   MCHC 32.0 01/01/2018 1215   RDW 11.7 01/13/2022 0729   RDW 13.6 12/12/2008 0804   Iron Studies     Component Value Date/Time   IRON 76 07/21/2008 0753   TIBC 295 07/21/2008 0753   FERRITIN 219 07/21/2008 0753   IRONPCTSAT 26 07/21/2008 0753   Lipid Panel     Component Value Date/Time   CHOL 274 (H) 04/23/2023 0941   TRIG 199 (H) 04/23/2023 0941   HDL 48 04/23/2023 0941   CHOLHDL 7.3 03/07/2008 0525   VLDL 38 03/07/2008 0525   LDLCALC 188 (H) 04/23/2023 0941   Hepatic Function Panel     Component Value Date/Time   PROT 6.3 04/23/2023 0941   ALBUMIN 4.2 04/23/2023 0941   AST 20 04/23/2023 0941   ALT 14 04/23/2023 0941   ALKPHOS 109 04/23/2023 0941   BILITOT 0.4 04/23/2023 0941   BILIDIR 0.6 (H) 03/09/2008 0500   IBILI 4.2 (H) 03/06/2008 1500      Component Value Date/Time   TSH 1.370 04/23/2023 0941   Nutritional Lab Results  Component Value Date   VD25OH 39.7 04/23/2023   VD25OH 28.0 05/15/2022   VD25OH 78.4 01/13/2022     Assessment and Plan    Obesity: Recent weight gain of 10 pounds despite adherence to category 3 eating plan 80% of the time. Discussed the importance of consistent meal planning and preparation. Discussed the potential use of GLP-1 drugs, but patient does not meet current criteria for coverage. -Continue category 3 eating plan. -Encourage meal planning and preparation. -Consider GLP-1 drugs if future indications include history of kidney issues and insurance covers. Risks of compounded GLP-1s discussed and the obesity society and endocrine society have position statement against compounded GLP-1's  Vitamin D deficiency: Recent labs showed improvement with a level of 39, but goal is 50. No issues reported with current supplementation. -Continue Vitamin D supplementation. -Check Vitamin D level at next visit.  Emotional Eating: Discussed strategies for managing emotional eating, including viewing dietary indiscretions as learning opportunities rather than sources of guilt. Discussed the importance of portion control and making healthier  versions of favorite foods. -Continue to work on strategies for managing emotional eating. -Encourage portion control and healthier food choices.  Hyperlipidemia: Patient recently restarted Crestor 3 times a week due to elevated cholesterol. No issues reported with current medication. -Continue Crestor as prescribed and continue with diet/exercise/weight loss efforts -Check lipid panel at next visit.  Stage 3 Chronic Kidney Disease: Discussed the importance of good blood pressure control and hydration for kidney health. Current blood pressure slightly elevated. -Encourage good hydration. -Check blood pressure at next visit.  Follow-up in 3 weeks to assess progress and adjust plan as needed.        She was informed of the importance of frequent follow up visits to maximize her success with intensive lifestyle modifications for her multiple health conditions.  Quillian Quince, MD

## 2023-06-14 ENCOUNTER — Other Ambulatory Visit (INDEPENDENT_AMBULATORY_CARE_PROVIDER_SITE_OTHER): Payer: Self-pay | Admitting: Family Medicine

## 2023-06-14 DIAGNOSIS — E559 Vitamin D deficiency, unspecified: Secondary | ICD-10-CM

## 2023-06-17 ENCOUNTER — Other Ambulatory Visit (INDEPENDENT_AMBULATORY_CARE_PROVIDER_SITE_OTHER): Payer: Self-pay | Admitting: Family Medicine

## 2023-06-17 DIAGNOSIS — F418 Other specified anxiety disorders: Secondary | ICD-10-CM

## 2023-06-18 ENCOUNTER — Encounter (INDEPENDENT_AMBULATORY_CARE_PROVIDER_SITE_OTHER): Payer: Self-pay

## 2023-06-18 ENCOUNTER — Ambulatory Visit (INDEPENDENT_AMBULATORY_CARE_PROVIDER_SITE_OTHER): Payer: Medicare Other | Admitting: Family Medicine

## 2023-07-09 ENCOUNTER — Ambulatory Visit (INDEPENDENT_AMBULATORY_CARE_PROVIDER_SITE_OTHER): Payer: Medicare Other | Admitting: Family Medicine

## 2023-07-09 ENCOUNTER — Encounter (INDEPENDENT_AMBULATORY_CARE_PROVIDER_SITE_OTHER): Payer: Self-pay | Admitting: Family Medicine

## 2023-07-09 VITALS — BP 123/52 | HR 71 | Temp 98.1°F | Ht 61.0 in | Wt 348.0 lb

## 2023-07-09 DIAGNOSIS — F418 Other specified anxiety disorders: Secondary | ICD-10-CM

## 2023-07-09 DIAGNOSIS — Z6841 Body Mass Index (BMI) 40.0 and over, adult: Secondary | ICD-10-CM | POA: Diagnosis not present

## 2023-07-09 DIAGNOSIS — E559 Vitamin D deficiency, unspecified: Secondary | ICD-10-CM

## 2023-07-09 DIAGNOSIS — E669 Obesity, unspecified: Secondary | ICD-10-CM | POA: Diagnosis not present

## 2023-07-09 MED ORDER — VITAMIN D (ERGOCALCIFEROL) 1.25 MG (50000 UNIT) PO CAPS: ORAL_CAPSULE | ORAL | 0 refills | Status: DC

## 2023-07-09 MED ORDER — FLUOXETINE HCL 20 MG PO TABS
20.0000 mg | ORAL_TABLET | Freq: Three times a day (TID) | ORAL | 0 refills | Status: DC
Start: 2023-07-09 — End: 2023-10-29

## 2023-07-09 MED ORDER — WEGOVY 0.25 MG/0.5ML ~~LOC~~ SOAJ
0.2500 mg | SUBCUTANEOUS | 0 refills | Status: DC
  Filled 2023-07-09 – 2023-09-21 (×3): qty 2, 28d supply, fill #0

## 2023-07-09 NOTE — Progress Notes (Signed)
.smr  Office: [phone]  /  Fax: [phone]  WEIGHT SUMMARY AND BIOMETRICS  Anthropometric Measurements Height: 5\' 1"  (1.549 m) Weight: (!) 348 lb (157.9 kg) BMI (Calculated): 65.79 Weight at Last Visit: 354 lb Weight Lost Since Last Visit: 6 lb Weight Gained Since Last Visit: 0 Starting Weight: 393 lb Total Weight Loss (lbs): 45 lb (20.4 kg)   Body Composition  Body Fat %: 68.8 % Fat Mass (lbs): 239.4 lbs Muscle Mass (lbs): 103 lbs Visceral Fat Rating : 35   Other Clinical Data Fasting: No Labs: No Today's Visit #: 35 Starting Date: 12/30/18    Chief Complaint: OBESITY  History of Present Illness   The patient, with a history of obesity, vitamin D deficiency, and depression with emotional eating, presents for a follow-up visit. She reports a weight loss of six pounds over the past five weeks, attributing this to adherence to a category three diet plan about 80% of the time. She is not engaged in formal exercise but is making efforts to increase physical activity.  The patient has been struggling with their insurance company to get coverage for weight loss medications, specifically Mangoro and Zepbound, both of which were denied. This has caused significant frustration and emotional distress, as the patient is eager to lose weight and improve her health. She expresses feelings of embarrassment and judgment from others due to her weight, which has been a source of emotional distress.  The patient also shares a personal history of trauma which has added to her emotional burden.  The patient is currently on Prozac and vitamin D, both of which she requests refills for. She reports no adverse effects from these medications. She expresses a desire to avoid bariatric surgery due to fear and personal beliefs. Despite the emotional and physical challenges, the patient remains determined to continue her weight loss journey.          PHYSICAL EXAM:  Blood pressure (!)  123/52, pulse 71, temperature 98.1 F (36.7 C), height 5\' 1"  (1.549 m), weight (!) 348 lb (157.9 kg), SpO2 95%. Body mass index is 65.75 kg/m.  DIAGNOSTIC DATA REVIEWED:  BMET    Component Value Date/Time   NA 142 04/23/2023 0941   K 4.9 04/23/2023 0941   CL 102 04/23/2023 0941   CO2 25 04/23/2023 0941   GLUCOSE 89 04/23/2023 0941   GLUCOSE 93 01/01/2018 1312   BUN 41 (H) 04/23/2023 0941   CREATININE 1.30 (H) 04/23/2023 0941   CALCIUM 9.9 04/23/2023 0941   GFRNONAA 55 (L) 10/15/2020 0806   GFRAA 63 10/15/2020 0806   Lab Results  Component Value Date   HGBA1C 5.4 04/23/2023   HGBA1C (L) 03/08/2008    3.6 (NOTE)   The ADA recommends the following therapeutic goals for glycemic   control related to Hgb A1C measurement:   Goal of Therapy:   < 7.0% Hgb A1C   Action Suggested:  > 8.0% Hgb A1C   Ref:  Diabetes Care, 22, Suppl. 1, 1999   Lab Results  Component Value Date   INSULIN 10.6 04/23/2023   INSULIN 16.9 12/30/2018   Lab Results  Component Value Date   TSH 1.370 04/23/2023   CBC    Component Value Date/Time   WBC 4.8 05/15/2022 0000   WBC 6.2 01/01/2018 1215   RBC 4.2 05/15/2022 0000   HGB 14.1 05/15/2022 0000   HGB 14.2 01/13/2022 0729   HGB 15.2 12/12/2008 0804   HCT 42 05/15/2022 0000   HCT 44.1  01/13/2022 0729   HCT 43.8 12/12/2008 0804   PLT 143 (A) 05/15/2022 0000   PLT 155 01/13/2022 0729   MCV 97 01/13/2022 0729   MCV 90.8 12/12/2008 0804   MCH 31.2 01/13/2022 0729   MCH 31.5 01/01/2018 1215   MCHC 32.2 01/13/2022 0729   MCHC 32.0 01/01/2018 1215   RDW 11.7 01/13/2022 0729   RDW 13.6 12/12/2008 0804   Iron Studies    Component Value Date/Time   IRON 76 07/21/2008 0753   TIBC 295 07/21/2008 0753   FERRITIN 219 07/21/2008 0753   IRONPCTSAT 26 07/21/2008 0753   Lipid Panel     Component Value Date/Time   CHOL 274 (H) 04/23/2023 0941   TRIG 199 (H) 04/23/2023 0941   HDL 48 04/23/2023 0941   CHOLHDL 7.3 03/07/2008 0525   VLDL 38 03/07/2008  0525   LDLCALC 188 (H) 04/23/2023 0941   Hepatic Function Panel     Component Value Date/Time   PROT 6.3 04/23/2023 0941   ALBUMIN 4.2 04/23/2023 0941   AST 20 04/23/2023 0941   ALT 14 04/23/2023 0941   ALKPHOS 109 04/23/2023 0941   BILITOT 0.4 04/23/2023 0941   BILIDIR 0.6 (H) 03/09/2008 0500   IBILI 4.2 (H) 03/06/2008 1500      Component Value Date/Time   TSH 1.370 04/23/2023 0941   Nutritional Lab Results  Component Value Date   VD25OH 39.7 04/23/2023   VD25OH 28.0 05/15/2022   VD25OH 78.4 01/13/2022     Assessment and Plan    Obesity Patient has lost six pounds in the last five weeks and is following the category three plan 80% of the time. She is not doing formal exercise but is trying to move more. Discussed the challenges with insurance coverage for weight loss medications. Discussed the possibility of bariatric surgery, but patient is not interested at this time. -Continue current weight loss plan. -Attempt to get Wegovy covered by insurance due to patient's comorbidities (hypertension, hyperlipidemia, prediabetes, asthma). -Schedule follow-up appointment in 3-4 weeks.  Vitamin D deficiency Patient requests refill on her vitamin D. -Refill vitamin D prescription.  Depression Patient is currently on 60mg  of Prozac and reports emotional eating. Discussed the impact of societal stigma and self-comparison on mental health. -Refill Prozac 60mg  prescription. -Continue to support patient's mental health and self-esteem in future appointments. She was offered support and encouragement.        I have personally spent 40 minutes total time today in preparation, patient care, and documentation for this visit, including the following: review of clinical lab tests; review of medical tests/procedures/services.    She was informed of the importance of frequent follow up visits to maximize her success with intensive lifestyle modifications for her multiple health conditions.     Quillian Quince, MD

## 2023-07-10 ENCOUNTER — Other Ambulatory Visit (HOSPITAL_COMMUNITY): Payer: Self-pay

## 2023-07-16 ENCOUNTER — Telehealth (INDEPENDENT_AMBULATORY_CARE_PROVIDER_SITE_OTHER): Payer: Self-pay | Admitting: *Deleted

## 2023-07-16 ENCOUNTER — Other Ambulatory Visit (HOSPITAL_COMMUNITY): Payer: Self-pay

## 2023-07-16 NOTE — Telephone Encounter (Signed)
WEGOVY WAS DENIED BY PATIENT'S INSURANCE COMPANY.

## 2023-07-16 NOTE — Telephone Encounter (Signed)
PA SUBMITTED VIA [employer] FOR WEGOVY

## 2023-07-24 ENCOUNTER — Other Ambulatory Visit (HOSPITAL_COMMUNITY): Payer: Self-pay

## 2023-07-29 ENCOUNTER — Other Ambulatory Visit (INDEPENDENT_AMBULATORY_CARE_PROVIDER_SITE_OTHER): Payer: Self-pay | Admitting: Family Medicine

## 2023-07-29 DIAGNOSIS — F418 Other specified anxiety disorders: Secondary | ICD-10-CM

## 2023-08-08 ENCOUNTER — Other Ambulatory Visit (HOSPITAL_COMMUNITY): Payer: Self-pay

## 2023-08-12 ENCOUNTER — Ambulatory Visit (INDEPENDENT_AMBULATORY_CARE_PROVIDER_SITE_OTHER): Payer: Medicare Other | Admitting: Family Medicine

## 2023-08-27 ENCOUNTER — Other Ambulatory Visit (INDEPENDENT_AMBULATORY_CARE_PROVIDER_SITE_OTHER): Payer: Self-pay | Admitting: Family Medicine

## 2023-08-27 DIAGNOSIS — E559 Vitamin D deficiency, unspecified: Secondary | ICD-10-CM

## 2023-08-28 ENCOUNTER — Other Ambulatory Visit (HOSPITAL_COMMUNITY): Payer: Self-pay

## 2023-09-15 ENCOUNTER — Other Ambulatory Visit (HOSPITAL_BASED_OUTPATIENT_CLINIC_OR_DEPARTMENT_OTHER): Payer: Self-pay

## 2023-09-15 ENCOUNTER — Encounter (HOSPITAL_BASED_OUTPATIENT_CLINIC_OR_DEPARTMENT_OTHER): Payer: Self-pay

## 2023-09-15 MED ORDER — WEGOVY 0.25 MG/0.5ML ~~LOC~~ SOAJ
0.2500 mg | SUBCUTANEOUS | 0 refills | Status: DC
Start: 2023-09-15 — End: 2023-10-29
  Filled 2023-09-15: qty 2, 28d supply, fill #0

## 2023-09-15 MED ORDER — WEGOVY 0.5 MG/0.5ML ~~LOC~~ SOAJ: 0.5000 mg | SUBCUTANEOUS | 1 refills | Status: DC

## 2023-09-17 ENCOUNTER — Other Ambulatory Visit (HOSPITAL_COMMUNITY): Payer: Self-pay

## 2023-09-18 ENCOUNTER — Other Ambulatory Visit (HOSPITAL_BASED_OUTPATIENT_CLINIC_OR_DEPARTMENT_OTHER): Payer: Self-pay

## 2023-09-18 MED ORDER — ZEPBOUND 2.5 MG/0.5ML ~~LOC~~ SOAJ
2.5000 mg | SUBCUTANEOUS | 0 refills | Status: DC
  Filled 2023-09-18: qty 2, 28d supply, fill #0

## 2023-09-18 MED ORDER — ZEPBOUND 5 MG/0.5ML ~~LOC~~ SOAJ
5.0000 mg | SUBCUTANEOUS | 5 refills | Status: DC
  Filled 2023-09-21: qty 2, 28d supply, fill #0

## 2023-09-21 ENCOUNTER — Other Ambulatory Visit: Payer: Self-pay

## 2023-09-25 ENCOUNTER — Other Ambulatory Visit (HOSPITAL_BASED_OUTPATIENT_CLINIC_OR_DEPARTMENT_OTHER): Payer: Self-pay

## 2023-10-07 ENCOUNTER — Other Ambulatory Visit (INDEPENDENT_AMBULATORY_CARE_PROVIDER_SITE_OTHER): Payer: Self-pay | Admitting: Family Medicine

## 2023-10-07 DIAGNOSIS — F418 Other specified anxiety disorders: Secondary | ICD-10-CM

## 2023-10-11 ENCOUNTER — Other Ambulatory Visit (INDEPENDENT_AMBULATORY_CARE_PROVIDER_SITE_OTHER): Payer: Self-pay | Admitting: Family Medicine

## 2023-10-11 DIAGNOSIS — F418 Other specified anxiety disorders: Secondary | ICD-10-CM

## 2023-10-29 ENCOUNTER — Ambulatory Visit (INDEPENDENT_AMBULATORY_CARE_PROVIDER_SITE_OTHER): Payer: Medicare Other | Admitting: Family Medicine

## 2023-10-29 ENCOUNTER — Telehealth (INDEPENDENT_AMBULATORY_CARE_PROVIDER_SITE_OTHER): Payer: Self-pay

## 2023-10-29 ENCOUNTER — Encounter (INDEPENDENT_AMBULATORY_CARE_PROVIDER_SITE_OTHER): Payer: Self-pay | Admitting: Family Medicine

## 2023-10-29 VITALS — BP 120/75 | HR 69 | Temp 98.3°F | Ht 61.0 in | Wt 357.0 lb

## 2023-10-29 DIAGNOSIS — Z6841 Body Mass Index (BMI) 40.0 and over, adult: Secondary | ICD-10-CM

## 2023-10-29 DIAGNOSIS — F5089 Other specified eating disorder: Secondary | ICD-10-CM | POA: Diagnosis not present

## 2023-10-29 DIAGNOSIS — E559 Vitamin D deficiency, unspecified: Secondary | ICD-10-CM

## 2023-10-29 DIAGNOSIS — R7303 Prediabetes: Secondary | ICD-10-CM

## 2023-10-29 DIAGNOSIS — F418 Other specified anxiety disorders: Secondary | ICD-10-CM

## 2023-10-29 DIAGNOSIS — E860 Dehydration: Secondary | ICD-10-CM

## 2023-10-29 DIAGNOSIS — E669 Obesity, unspecified: Secondary | ICD-10-CM

## 2023-10-29 MED ORDER — RYBELSUS 3 MG PO TABS: 3.0000 mg | ORAL_TABLET | Freq: Every day | ORAL | 0 refills | Status: DC

## 2023-10-29 MED ORDER — FLUOXETINE HCL 20 MG PO TABS: 20.0000 mg | ORAL_TABLET | Freq: Three times a day (TID) | ORAL | 0 refills | Status: DC

## 2023-10-29 MED ORDER — VITAMIN D (ERGOCALCIFEROL) 1.25 MG (50000 UNIT) PO CAPS: ORAL_CAPSULE | ORAL | 0 refills | Status: DC

## 2023-10-29 NOTE — Telephone Encounter (Signed)
Prior auth started  Key: [employer]

## 2023-10-29 NOTE — Progress Notes (Signed)
.smr  Office: [phone]  /  Fax: [phone]  WEIGHT SUMMARY AND BIOMETRICS  Anthropometric Measurements Height: 5\' 1"  (1.549 m) Weight: (!) 357 lb (161.9 kg) BMI (Calculated): 67.49 Weight at Last Visit: 348 lb Weight Lost Since Last Visit: 0 Weight Gained Since Last Visit: 9 lb Starting Weight: 393 lb Total Weight Loss (lbs): 36 lb (16.3 kg)   Body Composition  Body Fat %: 69.5 % Fat Mass (lbs): 248.6 lbs Muscle Mass (lbs): 103.4 lbs Visceral Fat Rating : 36   Other Clinical Data Fasting: Yes Labs: Yes Today's Visit #: 37 Starting Date: 12/12/18    Chief Complaint: OBESITY   History of Present Illness   The patient, currently on Prozac and Vitamin D, presents with concerns regarding her vitamin D deficiency, depression with emotional eating behaviors, and obesity. She reports a recent weight gain of nine pounds over the last four months, which includes the holiday season. Despite adhering to a category three eating plan 50% of the time and making efforts to increase physical activity, the patient struggles with weight management.  The patient has been exploring options for weight loss medications, specifically GLP-1s, but has faced repeated denials from her insurance. She was considered for a medication management study, but was ultimately not approved due to alleged health concerns raised by the study's doctor, which were later dismissed by the patient's primary care physician. The patient expressed frustration and disappointment with this process.  In addition to her personal health concerns, the patient is also dealing with increased stress and responsibilities at home, primarily related to the care of her elderly mother. She reports that this has significantly increased her stress levels and impacted her overall well-being.          PHYSICAL EXAM:  Blood pressure 120/75, pulse 69, temperature 98.3 F (36.8 C), height 5\' 1"  (1.549 m), weight (!) 357 lb (161.9  kg), SpO2 97%. Body mass index is 67.45 kg/m.  DIAGNOSTIC DATA REVIEWED:  BMET    Component Value Date/Time   NA 142 04/23/2023 0941   K 4.9 04/23/2023 0941   CL 102 04/23/2023 0941   CO2 25 04/23/2023 0941   GLUCOSE 89 04/23/2023 0941   GLUCOSE 93 01/01/2018 1312   BUN 41 (H) 04/23/2023 0941   CREATININE 1.30 (H) 04/23/2023 0941   CALCIUM 9.9 04/23/2023 0941   GFRNONAA 55 (L) 10/15/2020 0806   GFRAA 63 10/15/2020 0806   Lab Results  Component Value Date   HGBA1C 5.4 04/23/2023   HGBA1C (L) 03/08/2008    3.6 (NOTE)   The ADA recommends the following therapeutic goals for glycemic   control related to Hgb A1C measurement:   Goal of Therapy:   < 7.0% Hgb A1C   Action Suggested:  > 8.0% Hgb A1C   Ref:  Diabetes Care, 22, Suppl. 1, 1999   Lab Results  Component Value Date   INSULIN 10.6 04/23/2023   INSULIN 16.9 12/30/2018   Lab Results  Component Value Date   TSH 1.370 04/23/2023   CBC    Component Value Date/Time   WBC 4.8 05/15/2022 0000   WBC 6.2 01/01/2018 1215   RBC 4.2 05/15/2022 0000   HGB 14.1 05/15/2022 0000   HGB 14.2 01/13/2022 0729   HGB 15.2 12/12/2008 0804   HCT 42 05/15/2022 0000   HCT 44.1 01/13/2022 0729   HCT 43.8 12/12/2008 0804   PLT 143 (A) 05/15/2022 0000   PLT 155 01/13/2022 0729   MCV 97 01/13/2022 0729  MCV 90.8 12/12/2008 0804   MCH 31.2 01/13/2022 0729   MCH 31.5 01/01/2018 1215   MCHC 32.2 01/13/2022 0729   MCHC 32.0 01/01/2018 1215   RDW 11.7 01/13/2022 0729   RDW 13.6 12/12/2008 0804   Iron Studies    Component Value Date/Time   IRON 76 07/21/2008 0753   TIBC 295 07/21/2008 0753   FERRITIN 219 07/21/2008 0753   IRONPCTSAT 26 07/21/2008 0753   Lipid Panel     Component Value Date/Time   CHOL 274 (H) 04/23/2023 0941   TRIG 199 (H) 04/23/2023 0941   HDL 48 04/23/2023 0941   CHOLHDL 7.3 03/07/2008 0525   VLDL 38 03/07/2008 0525   LDLCALC 188 (H) 04/23/2023 0941   Hepatic Function Panel     Component Value  Date/Time   PROT 6.3 04/23/2023 0941   ALBUMIN 4.2 04/23/2023 0941   AST 20 04/23/2023 0941   ALT 14 04/23/2023 0941   ALKPHOS 109 04/23/2023 0941   BILITOT 0.4 04/23/2023 0941   BILIDIR 0.6 (H) 03/09/2008 0500   IBILI 4.2 (H) 03/06/2008 1500      Component Value Date/Time   TSH 1.370 04/23/2023 0941   Nutritional Lab Results  Component Value Date   VD25OH 39.7 04/23/2023   VD25OH 28.0 05/15/2022   VD25OH 78.4 01/13/2022     Assessment and Plan    Obesity Gained 9 pounds in the last four months. Adheres to a category three eating plan 50% of the time and not engaging in formal exercise. Discussed potential use of Rybelsus for weight management and prediabetes, including administration instructions to avoid nausea. 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 Reports emotional eating contributing to weight gain. - Refill Prozac prescription  Vitamin D Deficiency Vitamin D level remains below goal at 39 ng/mL. Currently on 50,000 IU weekly. Requested a refill. - Refill vitamin D prescription  Dehydration Previous labs indicated slightly impaired kidney function. Reports challenges with hydration. Discussed the importance of increased hydration with calorie-free, caffeine-free liquids to improve kidney function. - Recheck kidney function - Encourage increased hydration with calorie-free, caffeine-free liquids  Follow-up - Follow up after lab results and insurance approval for Rybelsus.         I have personally spent 45 minutes total time today in preparation, patient care, and documentation for this visit, including the following: review of clinical lab tests; review of medical tests/procedures/services.    She was informed of  the importance of frequent follow up visits to maximize her success with intensive lifestyle modifications for her multiple health conditions.    Quillian Quince, MD

## 2023-10-30 LAB — CMP14+EGFR
ALT: 11 [IU]/L (ref 0–32)
AST: 17 [IU]/L (ref 0–40)
Albumin: 4.1 g/dL (ref 3.9–4.9)
Alkaline Phosphatase: 108 [IU]/L (ref 44–121)
BUN/Creatinine Ratio: 33 — ABNORMAL HIGH (ref 12–28)
BUN: 41 mg/dL — ABNORMAL HIGH (ref 8–27)
Bilirubin Total: 0.7 mg/dL (ref 0.0–1.2)
CO2: 25 mmol/L (ref 20–29)
Calcium: 10 mg/dL (ref 8.7–10.3)
Chloride: 103 mmol/L (ref 96–106)
Creatinine, Ser: 1.25 mg/dL — ABNORMAL HIGH (ref 0.57–1.00)
Globulin, Total: 2.1 g/dL (ref 1.5–4.5)
Glucose: 91 mg/dL (ref 70–99)
Potassium: 5.4 mmol/L — ABNORMAL HIGH (ref 3.5–5.2)
Sodium: 142 mmol/L (ref 134–144)
Total Protein: 6.2 g/dL (ref 6.0–8.5)
eGFR: 47 mL/min/{1.73_m2} — ABNORMAL LOW (ref >59)

## 2023-11-02 ENCOUNTER — Encounter (INDEPENDENT_AMBULATORY_CARE_PROVIDER_SITE_OTHER): Payer: Self-pay

## 2023-11-02 NOTE — Telephone Encounter (Signed)
Rybelsus denied  Message from Plan Request Reference Number: [id number]. RYBELSUS TAB 3MG  is denied due to Plan Exclusion. For further questions, call [phone].

## 2023-11-23 ENCOUNTER — Other Ambulatory Visit (INDEPENDENT_AMBULATORY_CARE_PROVIDER_SITE_OTHER): Payer: Self-pay | Admitting: Family Medicine

## 2023-11-23 DIAGNOSIS — F418 Other specified anxiety disorders: Secondary | ICD-10-CM

## 2023-11-27 ENCOUNTER — Other Ambulatory Visit (INDEPENDENT_AMBULATORY_CARE_PROVIDER_SITE_OTHER): Payer: Self-pay | Admitting: Family Medicine

## 2023-11-27 DIAGNOSIS — F418 Other specified anxiety disorders: Secondary | ICD-10-CM

## 2023-12-10 ENCOUNTER — Encounter (INDEPENDENT_AMBULATORY_CARE_PROVIDER_SITE_OTHER): Payer: Self-pay | Admitting: Family Medicine

## 2023-12-10 ENCOUNTER — Ambulatory Visit (INDEPENDENT_AMBULATORY_CARE_PROVIDER_SITE_OTHER): Payer: Medicare Other | Admitting: Family Medicine

## 2023-12-10 VITALS — BP 124/67 | HR 76 | Temp 98.5°F | Ht 61.0 in | Wt 364.0 lb

## 2023-12-10 DIAGNOSIS — E559 Vitamin D deficiency, unspecified: Secondary | ICD-10-CM | POA: Diagnosis not present

## 2023-12-10 DIAGNOSIS — E785 Hyperlipidemia, unspecified: Secondary | ICD-10-CM | POA: Diagnosis not present

## 2023-12-10 DIAGNOSIS — Z6841 Body Mass Index (BMI) 40.0 and over, adult: Secondary | ICD-10-CM

## 2023-12-10 DIAGNOSIS — I10 Essential (primary) hypertension: Secondary | ICD-10-CM | POA: Diagnosis not present

## 2023-12-10 DIAGNOSIS — R7303 Prediabetes: Secondary | ICD-10-CM

## 2023-12-10 DIAGNOSIS — E669 Obesity, unspecified: Secondary | ICD-10-CM

## 2023-12-10 DIAGNOSIS — F418 Other specified anxiety disorders: Secondary | ICD-10-CM

## 2023-12-10 DIAGNOSIS — R600 Localized edema: Secondary | ICD-10-CM

## 2023-12-10 DIAGNOSIS — F5089 Other specified eating disorder: Secondary | ICD-10-CM

## 2023-12-10 MED ORDER — VITAMIN D (ERGOCALCIFEROL) 1.25 MG (50000 UNIT) PO CAPS: ORAL_CAPSULE | ORAL | 0 refills | Status: DC

## 2023-12-10 MED ORDER — FLUOXETINE HCL 20 MG PO TABS: 20.0000 mg | ORAL_TABLET | Freq: Three times a day (TID) | ORAL | 0 refills | Status: DC

## 2023-12-10 MED ORDER — FUROSEMIDE 20 MG PO TABS: ORAL_TABLET | ORAL | 0 refills | Status: DC

## 2023-12-10 MED ORDER — LISINOPRIL 10 MG PO TABS: 10.0000 mg | ORAL_TABLET | Freq: Every day | ORAL | 0 refills | Status: DC

## 2023-12-10 NOTE — Progress Notes (Signed)
.smr  Office: [phone]  /  Fax: [phone]  WEIGHT SUMMARY AND BIOMETRICS  Anthropometric Measurements Height: 5\' 1"  (1.549 m) Weight: (!) 364 lb (165.1 kg) BMI (Calculated): 68.81 Weight at Last Visit: 357 lb Weight Lost Since Last Visit: 0 Weight Gained Since Last Visit: 7 lb Starting Weight: 393 lb Total Weight Loss (lbs): 29 lb (13.2 kg)   Body Composition  Body Fat %: 70.6 % Fat Mass (lbs): 257.4 lbs Muscle Mass (lbs): 101.8 lbs Visceral Fat Rating : 37   Other Clinical Data Fasting: no Labs: no Today's Visit #: 18 Starting Date: 12/12/68    Chief Complaint: OBESITY    History of Present Illness   The patient presents with obesity and hyperlipidemia.  She has experienced a weight gain of seven pounds over the last five weeks, which she attributes to the holiday season. Despite efforts to control her weight through diet and exercise, she adheres to her dietary plan about 60% of the time and is attempting to increase her physical activity by walking for 15 minutes most days of the week.  She is currently taking Crestor 5 mg every other day for hyperlipidemia, lisinopril for hypertension, vitamin D supplements, Lasix for edema, and Prozac for emotional eating behaviors. She takes Lasix every other day or sometimes every three days and Prozac three pills daily. She is mindful of hydration to prevent dehydration, especially while on her current dietary plan.  Recent lab work indicated fluctuating potassium levels, which she attributes to her Lasix use. She has stopped using salt due to swelling concerns. Her creatinine levels have improved, but she was noted to be dehydrated at the time of the last lab test. Her LDL cholesterol levels were previously elevated, but she has resumed her cholesterol medication. Her vitamin D levels are improving, and her B12 levels are adequate. Her hemoglobin A1c was 5.4, slightly higher than usual but still within a good range. Her TSH  levels are stable.  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          PHYSICAL EXAM:  Blood pressure 124/67, pulse 76, temperature 98.5 F (36.9 C), height 5\' 1"  (1.549 m), weight (!) 364 lb (165.1 kg), SpO2 94%. Body mass index is 68.78 kg/m.  DIAGNOSTIC DATA REVIEWED:  BMET    Component Value Date/Time   NA 142 10/29/2023 1154   K 5.4 (H) 10/29/2023 1154   CL 103 10/29/2023 1154   CO2 25 10/29/2023 1154   GLUCOSE 91 10/29/2023 1154   GLUCOSE 93 01/01/2018 1312   BUN 41 (H) 10/29/2023 1154   CREATININE 1.25 (H) 10/29/2023 1154   CALCIUM 10.0 10/29/2023 1154   GFRNONAA 55 (L) 10/15/2020 0806   GFRAA 63 10/15/2020 0806   Lab Results  Component Value Date   HGBA1C 5.4 04/23/2023   HGBA1C (L) 03/08/2008    3.6 (NOTE)   The ADA recommends the following therapeutic goals for glycemic   control related to Hgb A1C measurement:   Goal of Therapy:   < 7.0% Hgb A1C   Action Suggested:  > 8.0% Hgb A1C   Ref:  Diabetes Care, 22, Suppl. 1, 1999   Lab Results  Component Value Date   INSULIN 10.6 04/23/2023   INSULIN 16.9 12/30/2018   Lab Results  Component Value Date   TSH 1.370 04/23/2023   CBC    Component Value Date/Time   WBC 4.8 05/15/2022 0000  WBC 6.2 01/01/2018 1215   RBC 4.2 05/15/2022 0000   HGB 14.1 05/15/2022 0000   HGB 14.2 01/13/2022 0729   HGB 15.2 12/12/2008 0804   HCT 42 05/15/2022 0000   HCT 44.1 01/13/2022 0729   HCT 43.8 12/12/2008 0804   PLT 143 (A) 05/15/2022 0000   PLT 155 01/13/2022 0729   MCV 97 01/13/2022 0729   MCV 90.8 12/12/2008 0804   MCH 31.2 01/13/2022 0729   MCH 31.5 01/01/2018 1215   MCHC 32.2 01/13/2022 0729   MCHC 32.0 01/01/2018 1215   RDW 11.7 01/13/2022 0729   RDW 13.6 12/12/2008 0804   Iron Studies    Component Value Date/Time   IRON 76 07/21/2008 0753   TIBC 295 07/21/2008 0753   FERRITIN 219 07/21/2008 0753   IRONPCTSAT 26 07/21/2008  0753   Lipid Panel     Component Value Date/Time   CHOL 274 (H) 04/23/2023 0941   TRIG 199 (H) 04/23/2023 0941   HDL 48 04/23/2023 0941   CHOLHDL 7.3 03/07/2008 0525   VLDL 38 03/07/2008 0525   LDLCALC 188 (H) 04/23/2023 0941   Hepatic Function Panel     Component Value Date/Time   PROT 6.2 10/29/2023 1154   ALBUMIN 4.1 10/29/2023 1154   AST 17 10/29/2023 1154   ALT 11 10/29/2023 1154   ALKPHOS 108 10/29/2023 1154   BILITOT 0.7 10/29/2023 1154   BILIDIR 0.6 (H) 03/09/2008 0500   IBILI 4.2 (H) 03/06/2008 1500      Component Value Date/Time   TSH 1.370 04/23/2023 0941   Nutritional Lab Results  Component Value Date   VD25OH 39.7 04/23/2023   VD25OH 28.0 05/15/2022   VD25OH 78.4 01/13/2022     Assessment and Plan    Obesity Gained seven pounds in the last five weeks, likely due to holiday indulgence. Following a category three plan 60% of the time and increasing physical activity. Discussed the impact of high-carb foods on insulin levels and hunger. Discussed starting Mounjaro 5 mg weekly, including potential GI side effects like queasiness and the importance of proper injection technique. Emphasized the need for increased hydration to at least 80 ounces of calorie-free, caffeine-free liquid daily. - Initiate a two-week low-carb detox plan - Start Mounjaro 5 mg weekly - Educate on proper injection technique for Mounjaro - Increase hydration to at least 80 ounces of calorie-free, caffeine-free liquid daily - Follow-up in four weeks  Emotional Eating Behaviors Reports stress-related eating behaviors, particularly during the holidays. Currently on Prozac. Discussed the impact of stress on eating habits and the importance of stress management. - Continue Prozac - Provide support and counseling for stress management  Hyperlipidemia On Crestor 5 mg every other day but has not been consistent. Recent labs show LDL cholesterol levels need monitoring. Discussed the importance  of consistent medication adherence. - Continue Crestor 5 mg every other day - Monitor cholesterol levels  Hypertension Blood pressure is well-controlled on current medication regimen. - Refill lisinopril  Edema Edema is well-managed on Lasix, but needs to be cautious about hydration, especially on the low-carb plan. Discussed potential dehydration risks and the need to monitor for signs of dehydration. - Continue Lasix every other day or as needed - Monitor for signs of dehydration  Vitamin D Deficiency Vitamin D levels are improving but require ongoing supplementation. - Refill vitamin D prescription  General Health Maintenance Working on increasing physical activity and managing diet. Discussed the importance of hydration and the low-carb diet. - Encourage daily physical  activity - Educate on low-carb diet and hydration - Monitor for any adverse effects from new medication regimen  Follow-up - Schedule follow-up appointment in four weeks - Ensure next two appointments are scheduled.         I have personally spent 40 minutes total time today in preparation, patient care, and documentation for this visit, including the following: review of clinical lab tests; review of medical tests/procedures/services.    She was informed of the importance of frequent follow up visits to maximize her success with intensive lifestyle modifications for her multiple health conditions.    Quillian Quince, MD

## 2023-12-27 ENCOUNTER — Other Ambulatory Visit (INDEPENDENT_AMBULATORY_CARE_PROVIDER_SITE_OTHER): Payer: Self-pay | Admitting: Family Medicine

## 2023-12-27 DIAGNOSIS — I10 Essential (primary) hypertension: Secondary | ICD-10-CM

## 2024-01-07 ENCOUNTER — Encounter (INDEPENDENT_AMBULATORY_CARE_PROVIDER_SITE_OTHER): Payer: Self-pay | Admitting: Family Medicine

## 2024-01-07 ENCOUNTER — Ambulatory Visit (INDEPENDENT_AMBULATORY_CARE_PROVIDER_SITE_OTHER): Payer: Medicare Other | Admitting: Family Medicine

## 2024-01-07 VITALS — BP 98/62 | HR 74 | Temp 98.0°F | Ht 61.0 in | Wt 348.0 lb

## 2024-01-07 DIAGNOSIS — E66813 Obesity, class 3: Secondary | ICD-10-CM

## 2024-01-07 DIAGNOSIS — E559 Vitamin D deficiency, unspecified: Secondary | ICD-10-CM

## 2024-01-07 DIAGNOSIS — Z6841 Body Mass Index (BMI) 40.0 and over, adult: Secondary | ICD-10-CM

## 2024-01-07 DIAGNOSIS — R600 Localized edema: Secondary | ICD-10-CM | POA: Diagnosis not present

## 2024-01-07 DIAGNOSIS — I10 Essential (primary) hypertension: Secondary | ICD-10-CM

## 2024-01-07 DIAGNOSIS — E669 Obesity, unspecified: Secondary | ICD-10-CM

## 2024-01-07 DIAGNOSIS — R7303 Prediabetes: Secondary | ICD-10-CM

## 2024-01-07 MED ORDER — LISINOPRIL 10 MG PO TABS: 10.0000 mg | ORAL_TABLET | Freq: Every day | ORAL | 0 refills | Status: DC

## 2024-01-07 MED ORDER — TIRZEPATIDE 5 MG/0.5ML ~~LOC~~ SOAJ: 5.0000 mg | SUBCUTANEOUS | Status: DC

## 2024-01-07 MED ORDER — FUROSEMIDE 20 MG PO TABS: ORAL_TABLET | ORAL | 0 refills | Status: DC

## 2024-01-07 MED ORDER — VITAMIN D (ERGOCALCIFEROL) 1.25 MG (50000 UNIT) PO CAPS: ORAL_CAPSULE | ORAL | 0 refills | Status: DC

## 2024-01-07 NOTE — Progress Notes (Signed)
 .smr  Office: [phone]  /  Fax: [phone]  WEIGHT SUMMARY AND BIOMETRICS  Anthropometric Measurements Height: 5\' 1"  (1.549 m) Weight: (!) 348 lb (157.9 kg) BMI (Calculated): 65.79 Weight at Last Visit: 364 lb Weight Lost Since Last Visit: 16 lb Weight Gained Since Last Visit: 0 Starting Weight: 348 lb Total Weight Loss (lbs): 45 lb (20.4 kg)   Body Composition  Body Fat %: 66.3 % Fat Mass (lbs): 231 lbs Muscle Mass (lbs): 111.4 lbs Visceral Fat Rating : 34   Other Clinical Data Fasting: yes Labs: no Today's Visit #: 32 Starting Date: 12/30/18    Chief Complaint: OBESITY   Discussed the use of AI scribe software for clinical note transcription with the patient, who gave verbal consent to proceed.  History of Present Illness   Maria Stone is a 68 year old female who presents for obesity treatment plan and progress monitoring.  She has successfully lost 16 pounds since her last visit by following a low-carb eating plan approximately 75% of the time and increasing her physical activity through walking. She is actively working on her obesity treatment plan.  She is currently taking lisinopril for blood pressure management and experienced mild dizziness recently, which she attributes to possible dehydration. She is attempting to increase her water intake. She requests a refill for lisinopril.  She has a history of significant bilateral lower extremity edema, for which she has been treated with Lasix. She is working on weight loss to help improve her symptoms and requests a refill for Lasix.  She has a history of prediabetes and is managing it through diet, exercise, and weight loss. She is not currently on Rybelsus, but instead is taking Mounjaro 5 mg from when her sister had it and is no longer taking it  She has a history of vitamin D deficiency and is on prescription vitamin D. Her last vitamin D level was below goal at 39.7, and she requests a refill for  her vitamin D prescription.  No gastrointestinal upset or nausea. She reports feeling dizzy, which she attributes to dehydration.          PHYSICAL EXAM:  Blood pressure 98/62, pulse 74, temperature 98 F (36.7 C), height 5\' 1"  (1.549 m), weight (!) 348 lb (157.9 kg), SpO2 92%. Body mass index is 65.75 kg/m.  DIAGNOSTIC DATA REVIEWED:  BMET    Component Value Date/Time   NA 142 10/29/2023 1154   K 5.4 (H) 10/29/2023 1154   CL 103 10/29/2023 1154   CO2 25 10/29/2023 1154   GLUCOSE 91 10/29/2023 1154   GLUCOSE 93 01/01/2018 1312   BUN 41 (H) 10/29/2023 1154   CREATININE 1.25 (H) 10/29/2023 1154   CALCIUM 10.0 10/29/2023 1154   GFRNONAA 55 (L) 10/15/2020 0806   GFRAA 63 10/15/2020 0806   Lab Results  Component Value Date   HGBA1C 5.4 04/23/2023   HGBA1C (L) 03/08/2008    3.6 (NOTE)   The ADA recommends the following therapeutic goals for glycemic   control related to Hgb A1C measurement:   Goal of Therapy:   < 7.0% Hgb A1C   Action Suggested:  > 8.0% Hgb A1C   Ref:  Diabetes Care, 22, Suppl. 1, 1999   Lab Results  Component Value Date   INSULIN 10.6 04/23/2023   INSULIN 16.9 12/30/2018   Lab Results  Component Value Date   TSH 1.370 04/23/2023   CBC    Component Value Date/Time   WBC 4.8 05/15/2022 0000  WBC 6.2 01/01/2018 1215   RBC 4.2 05/15/2022 0000   HGB 14.1 05/15/2022 0000   HGB 14.2 01/13/2022 0729   HGB 15.2 12/12/2008 0804   HCT 42 05/15/2022 0000   HCT 44.1 01/13/2022 0729   HCT 43.8 12/12/2008 0804   PLT 143 (A) 05/15/2022 0000   PLT 155 01/13/2022 0729   MCV 97 01/13/2022 0729   MCV 90.8 12/12/2008 0804   MCH 31.2 01/13/2022 0729   MCH 31.5 01/01/2018 1215   MCHC 32.2 01/13/2022 0729   MCHC 32.0 01/01/2018 1215   RDW 11.7 01/13/2022 0729   RDW 13.6 12/12/2008 0804   Iron Studies    Component Value Date/Time   IRON 76 07/21/2008 0753   TIBC 295 07/21/2008 0753   FERRITIN 219 07/21/2008 0753   IRONPCTSAT 26 07/21/2008 0753   Lipid  Panel     Component Value Date/Time   CHOL 274 (H) 04/23/2023 0941   TRIG 199 (H) 04/23/2023 0941   HDL 48 04/23/2023 0941   CHOLHDL 7.3 03/07/2008 0525   VLDL 38 03/07/2008 0525   LDLCALC 188 (H) 04/23/2023 0941   Hepatic Function Panel     Component Value Date/Time   PROT 6.2 10/29/2023 1154   ALBUMIN 4.1 10/29/2023 1154   AST 17 10/29/2023 1154   ALT 11 10/29/2023 1154   ALKPHOS 108 10/29/2023 1154   BILITOT 0.7 10/29/2023 1154   BILIDIR 0.6 (H) 03/09/2008 0500   IBILI 4.2 (H) 03/06/2008 1500      Component Value Date/Time   TSH 1.370 04/23/2023 0941   Nutritional Lab Results  Component Value Date   VD25OH 39.7 04/23/2023   VD25OH 28.0 05/15/2022   VD25OH 78.4 01/13/2022     Assessment and Plan    Obesity She has lost 16 pounds since the last visit by following a low-carb diet and incorporating more walking. This weight loss has improved her overall health, including blood pressure and edema. Discussed the importance of maintaining hydration to prevent dizziness and hypotension. Encouraged her to eat at least three times a day to maintain metabolism and prevent dehydration. - Continue low-carb diet - Encourage eating at least three times a day - Continue walking and other physical activities - Monitor weight and adjust treatment as necessary  Hypertension Her blood pressure today was 90/62, likely due to dehydration from recent weight loss and increased water intake. She is currently on lisinopril. Discussed the need to monitor blood pressure closely and adjust medication if necessary. - Refill lisinopril - Encourage increased water intake - Monitor blood pressure and adjust medication if necessary  Continue low-carb diet - Encourage eating at least three times a day - Continue walking and other physical activities  Bilateral Lower Extremity Edema Edema is being managed with Lasix and weight loss, which is expected to further improve symptoms. - Refill  Lasix - Continue weight loss efforts - Monitor edema and adjust treatment as necessary  Prediabetes She is managing prediabetes with diet, exercise, and weight loss. She is not on Rybelsus. Discussed the importance of continuing lifestyle modifications to manage blood glucose levels. - Continue diet and exercise regimen - Monitor blood glucose levels - Remove Rybelsus from medication list -Add Mounjaro 5 mg to her medication list but cannot prescribe it due to her insurance not covering it. Patient is taking this of her own volition and understands I cannot recommend taking another person's medication. I will however continue to monitor her prediabetes and her progress to make sure she does  not run into any problems.   Vitamin D Deficiency Her vitamin D level was 39.7, below goal. She is on prescription vitamin D. Discussed the need to continue supplementation and monitoring levels. - Refill vitamin D prescription - Continue monitoring vitamin D levels  General Health Maintenance She is managing her conditions well with lifestyle modifications and medications. Discussed the importance of continued adherence to her low-carb diet, regular physical activity, and adequate hydration. - Encourage continued adherence to low-carb diet and regular physical activity - Ensure adequate hydration -follow up in 4 weeks         I have personally spent 40 minutes total time today in preparation, patient care, and documentation for this visit, including the following: review of clinical lab tests; review of medical tests/procedures/services.    She was informed of the importance of frequent follow up visits to maximize her success with intensive lifestyle modifications for her multiple health conditions.    Quillian Quince, MD

## 2024-01-09 ENCOUNTER — Other Ambulatory Visit (INDEPENDENT_AMBULATORY_CARE_PROVIDER_SITE_OTHER): Payer: Self-pay | Admitting: Family Medicine

## 2024-01-09 DIAGNOSIS — F418 Other specified anxiety disorders: Secondary | ICD-10-CM

## 2024-01-18 NOTE — Telephone Encounter (Signed)
 N/a sch error

## 2024-01-21 ENCOUNTER — Other Ambulatory Visit (INDEPENDENT_AMBULATORY_CARE_PROVIDER_SITE_OTHER): Payer: Self-pay | Admitting: Family Medicine

## 2024-01-21 DIAGNOSIS — I10 Essential (primary) hypertension: Secondary | ICD-10-CM

## 2024-02-04 ENCOUNTER — Encounter (INDEPENDENT_AMBULATORY_CARE_PROVIDER_SITE_OTHER): Payer: Self-pay | Admitting: Family Medicine

## 2024-02-04 ENCOUNTER — Ambulatory Visit (INDEPENDENT_AMBULATORY_CARE_PROVIDER_SITE_OTHER): Payer: Medicare Other | Admitting: Family Medicine

## 2024-02-04 VITALS — BP 124/71 | HR 71 | Temp 97.8°F | Ht 61.0 in | Wt 347.0 lb

## 2024-02-04 DIAGNOSIS — F5089 Other specified eating disorder: Secondary | ICD-10-CM

## 2024-02-04 DIAGNOSIS — Z6841 Body Mass Index (BMI) 40.0 and over, adult: Secondary | ICD-10-CM | POA: Diagnosis not present

## 2024-02-04 DIAGNOSIS — I10 Essential (primary) hypertension: Secondary | ICD-10-CM | POA: Diagnosis not present

## 2024-02-04 DIAGNOSIS — Z7985 Long-term (current) use of injectable non-insulin antidiabetic drugs: Secondary | ICD-10-CM

## 2024-02-04 DIAGNOSIS — E669 Obesity, unspecified: Secondary | ICD-10-CM | POA: Diagnosis not present

## 2024-02-04 DIAGNOSIS — F418 Other specified anxiety disorders: Secondary | ICD-10-CM

## 2024-02-04 MED ORDER — LISINOPRIL 10 MG PO TABS: 10.0000 mg | ORAL_TABLET | Freq: Every day | ORAL | 0 refills | Status: DC

## 2024-02-04 MED ORDER — FLUOXETINE HCL 60 MG PO TABS: 1.0000 | ORAL_TABLET | Freq: Every day | ORAL | 0 refills | Status: DC

## 2024-02-04 NOTE — Progress Notes (Signed)
 Office: [phone]  /  Fax: [phone]  WEIGHT SUMMARY AND BIOMETRICS  Anthropometric Measurements Height: 5\' 1"  (1.549 m) Weight: (!) 347 lb (157.4 kg) BMI (Calculated): 65.6 Weight at Last Visit: 348 lb Weight Lost Since Last Visit: 1 lb Weight Gained Since Last Visit: 0 Starting Weight: 348 lb Total Weight Loss (lbs): 46 lb (20.9 kg)   Body Composition  Body Fat %: 68.1 % Fat Mass (lbs): 236.8 lbs Muscle Mass (lbs): 105.4 lbs Visceral Fat Rating : 34   Other Clinical Data Fasting: Yes Labs: No Today's Visit #: 60 Starting Date: 12/30/18    Chief Complaint: OBESITY   History of Present Illness   Maria Stone is a 68 year old female with obesity, hypertension, and type 2 diabetes who presents for obesity treatment and progress assessment.  She has been adhering to a low-carbohydrate diet approximately 80% of the time and is attempting to increase her physical activity, although she is not participating in structured exercise. She has experienced a weight loss of one pound over the past month and a total of 46 pounds since initiating her weight loss efforts. She notes changes in how her clothes fit and perceives a difference in her body, despite the scale not reflecting significant weight loss recently. Her appetite is well-managed with Mounjaro, but she encounters difficulty with protein intake and frequently consumes only one meal per day due to reduced hunger.  She has type 2 diabetes and is currently taking Mounjaro 5 mg weekly, which effectively controls her appetite, aiding in her weight management.  Her hypertension is managed with lisinopril 10 mg daily, and she requests a refill of this medication.  She has a history of emotional eating behaviors and is on Prozac 60 mg daily to assist in managing this condition.          PHYSICAL EXAM:  Blood pressure 124/71, pulse 71, temperature 97.8 F (36.6 C), height 5\' 1"  (1.549 m), weight (!) 347 lb  (157.4 kg), SpO2 98%. Body mass index is 65.57 kg/m.  DIAGNOSTIC DATA REVIEWED:  BMET    Component Value Date/Time   NA 142 10/29/2023 1154   K 5.4 (H) 10/29/2023 1154   CL 103 10/29/2023 1154   CO2 25 10/29/2023 1154   GLUCOSE 91 10/29/2023 1154   GLUCOSE 93 01/01/2018 1312   BUN 41 (H) 10/29/2023 1154   CREATININE 1.25 (H) 10/29/2023 1154   CALCIUM 10.0 10/29/2023 1154   GFRNONAA 55 (L) 10/15/2020 0806   GFRAA 63 10/15/2020 0806   Lab Results  Component Value Date   HGBA1C 5.4 04/23/2023   HGBA1C (L) 03/08/2008    3.6 (NOTE)   The ADA recommends the following therapeutic goals for glycemic   control related to Hgb A1C measurement:   Goal of Therapy:   < 7.0% Hgb A1C   Action Suggested:  > 8.0% Hgb A1C   Ref:  Diabetes Care, 22, Suppl. 1, 1999   Lab Results  Component Value Date   INSULIN 10.6 04/23/2023   INSULIN 16.9 12/30/2018   Lab Results  Component Value Date   TSH 1.370 04/23/2023   CBC    Component Value Date/Time   WBC 4.8 05/15/2022 0000   WBC 6.2 01/01/2018 1215   RBC 4.2 05/15/2022 0000   HGB 14.1 05/15/2022 0000   HGB 14.2 01/13/2022 0729   HGB 15.2 12/12/2008 0804   HCT 42 05/15/2022 0000   HCT 44.1 01/13/2022 0729   HCT 43.8 12/12/2008 0804  PLT 143 (A) 05/15/2022 0000   PLT 155 01/13/2022 0729   MCV 97 01/13/2022 0729   MCV 90.8 12/12/2008 0804   MCH 31.2 01/13/2022 0729   MCH 31.5 01/01/2018 1215   MCHC 32.2 01/13/2022 0729   MCHC 32.0 01/01/2018 1215   RDW 11.7 01/13/2022 0729   RDW 13.6 12/12/2008 0804   Iron Studies    Component Value Date/Time   IRON 76 07/21/2008 0753   TIBC 295 07/21/2008 0753   FERRITIN 219 07/21/2008 0753   IRONPCTSAT 26 07/21/2008 0753   Lipid Panel     Component Value Date/Time   CHOL 274 (H) 04/23/2023 0941   TRIG 199 (H) 04/23/2023 0941   HDL 48 04/23/2023 0941   CHOLHDL 7.3 03/07/2008 0525   VLDL 38 03/07/2008 0525   LDLCALC 188 (H) 04/23/2023 0941   Hepatic Function Panel     Component  Value Date/Time   PROT 6.2 10/29/2023 1154   ALBUMIN 4.1 10/29/2023 1154   AST 17 10/29/2023 1154   ALT 11 10/29/2023 1154   ALKPHOS 108 10/29/2023 1154   BILITOT 0.7 10/29/2023 1154   BILIDIR 0.6 (H) 03/09/2008 0500   IBILI 4.2 (H) 03/06/2008 1500      Component Value Date/Time   TSH 1.370 04/23/2023 0941   Nutritional Lab Results  Component Value Date   VD25OH 39.7 04/23/2023   VD25OH 28.0 05/15/2022   VD25OH 78.4 01/13/2022     Assessment and Plan    Obesity She is actively working on weight loss through a low-carb eating plan and portion control, resulting in a one-pound loss in the last month and a total of forty-six pounds overall. Hunger is controlled with Mounjaro, but protein intake may be insufficient, potentially slowing metabolism. Emphasized the importance of adequate protein and calorie intake to prevent metabolic slowdown due to the body's adaptive response to low calorie and protein intake. - Continue low-carb eating plan - Ensure intake of at least 100 grams of protein daily - Maintain calorie intake around 1400-1500 calories per day - Track food intake using a notebook or My Fitness Pal app, pt shown how to do this in the office today - Follow up on April 24 and May 22  Type 2 Diabetes Mellitus Type 2 diabetes is managed with Mijaro 5 mg weekly. - Continue Mijaro 5 mg weekly  Hypertension Hypertension is well-controlled with current medication regimen. Blood pressure today is 124/71 mmHg. - Refill lisinopril 10 mg - Continue current antihypertensive regimen -Continue diet, exercise and weight loss efforts   Emotional Eating Emotional eating behaviors are managed with Prozac 60 mg daily. - Refill Prozac 60 mg - Continue current regimen and will work on EEB strategies        She was informed of the importance of frequent follow up visits to maximize her success with intensive lifestyle modifications for her multiple health conditions.    Quillian Quince, MD

## 2024-02-10 ENCOUNTER — Other Ambulatory Visit (INDEPENDENT_AMBULATORY_CARE_PROVIDER_SITE_OTHER): Payer: Self-pay | Admitting: Family Medicine

## 2024-02-10 DIAGNOSIS — R600 Localized edema: Secondary | ICD-10-CM

## 2024-03-03 ENCOUNTER — Encounter (INDEPENDENT_AMBULATORY_CARE_PROVIDER_SITE_OTHER): Payer: Self-pay | Admitting: Family Medicine

## 2024-03-03 ENCOUNTER — Telehealth (INDEPENDENT_AMBULATORY_CARE_PROVIDER_SITE_OTHER): Payer: Medicare Other | Admitting: Family Medicine

## 2024-03-03 VITALS — Ht 61.0 in

## 2024-03-03 DIAGNOSIS — R600 Localized edema: Secondary | ICD-10-CM

## 2024-03-03 DIAGNOSIS — Z6841 Body Mass Index (BMI) 40.0 and over, adult: Secondary | ICD-10-CM

## 2024-03-03 DIAGNOSIS — E860 Dehydration: Secondary | ICD-10-CM

## 2024-03-03 DIAGNOSIS — E559 Vitamin D deficiency, unspecified: Secondary | ICD-10-CM

## 2024-03-03 DIAGNOSIS — I10 Essential (primary) hypertension: Secondary | ICD-10-CM

## 2024-03-03 DIAGNOSIS — E669 Obesity, unspecified: Secondary | ICD-10-CM

## 2024-03-03 DIAGNOSIS — R7303 Prediabetes: Secondary | ICD-10-CM | POA: Insufficient documentation

## 2024-03-03 DIAGNOSIS — G4730 Sleep apnea, unspecified: Secondary | ICD-10-CM

## 2024-03-03 DIAGNOSIS — J111 Influenza due to unidentified influenza virus with other respiratory manifestations: Secondary | ICD-10-CM | POA: Diagnosis not present

## 2024-03-03 DIAGNOSIS — F418 Other specified anxiety disorders: Secondary | ICD-10-CM

## 2024-03-03 DIAGNOSIS — R0683 Snoring: Secondary | ICD-10-CM | POA: Insufficient documentation

## 2024-03-03 NOTE — Progress Notes (Addendum)
 Office: [phone]  /  Fax: [phone]  WEIGHT SUMMARY AND BIOMETRICS  Anthropometric Measurements Height: 5\' 1"  (1.549 m) Weight at Last Visit: 347 lb Starting Weight: 348 lb   No data recorded Other Clinical Data Fasting: no Labs: no Today's Visit #: 59 Starting Date: 12/30/18    Chief Complaint: OBESITY  Virtual Visit via A/V Note  I connected with Maria Stone on 03/28/24 at  8:40 AM EDT by audiovisual telehealth and verified that I am speaking with the correct person using two identifiers.  Location: Patient: home Provider: clinic   I discussed the limitations, risks, security and privacy concerns of performing an evaluation and management service by AV telehealth and the availability of in person appointments. I also discussed with the patient that there may be a patient responsible charge related to this service. The patient expressed understanding and agreed to proceed.     History of Present Illness   Maria Stone is a 68 year old female who presents with flu-like symptoms including vomiting, diarrhea, and body aches.  Symptoms began on Sunday with vomiting and diarrhea, accompanied by body aches and an initial headache that has since resolved. She feels cold and unable to get warm, suspecting she may have contracted the illness from a house cleaner and her son. She has been able to keep liquids down, primarily drinking water, but has not been eating much due to feeling unwell. She started feeling better yesterday but then felt worse again.  She has been taking Mucinex D, a 12-hour formulation, and Tylenol  occasionally for symptom relief, being cautious about medication intake, particularly avoiding overlap with acetaminophen .  She mentions feeling dizzy or lightheaded prior to the onset of her current illness, which she attributes to possible dehydration. She has been trying to increase her water intake and reduced her gabapentin dosage,  suspecting it might contribute to her dizziness. She has a history of low blood pressure readings, which she associates with dehydration, and has adjusted her blood pressure medication accordingly, particularly Lasix , to avoid further dehydration.  She is unable to take her Mounjaro  shot this week due to a supply issue with her sister, who is a type 1 diabetic, and expresses concern about the continuity of her treatment with Mounjaro .  She frequently visits her mother and has access to a blood pressure machine at her mother's house.          PHYSICAL EXAM:  Height 5\' 1"  (1.549 m). Body mass index is 65.57 kg/m.  DIAGNOSTIC DATA REVIEWED:  BMET    Component Value Date/Time   NA 142 10/29/2023 1154   K 5.4 (H) 10/29/2023 1154   CL 103 10/29/2023 1154   CO2 25 10/29/2023 1154   GLUCOSE 91 10/29/2023 1154   GLUCOSE 93 01/01/2018 1312   BUN 41 (H) 10/29/2023 1154   CREATININE 1.25 (H) 10/29/2023 1154   CALCIUM 10.0 10/29/2023 1154   GFRNONAA 55 (L) 10/15/2020 0806   GFRAA 63 10/15/2020 0806   Lab Results  Component Value Date   HGBA1C 5.4 04/23/2023   HGBA1C (L) 03/08/2008    3.6 (NOTE)   The ADA recommends the following therapeutic goals for glycemic   control related to Hgb A1C measurement:   Goal of Therapy:   < 7.0% Hgb A1C   Action Suggested:  > 8.0% Hgb A1C   Ref:  Diabetes Care, 22, Suppl. 1, 1999   Lab Results  Component Value Date   INSULIN  10.6 04/23/2023   INSULIN   16.9 12/30/2018   Lab Results  Component Value Date   TSH 1.370 04/23/2023   CBC    Component Value Date/Time   WBC 4.8 05/15/2022 0000   WBC 6.2 01/01/2018 1215   RBC 4.2 05/15/2022 0000   HGB 14.1 05/15/2022 0000   HGB 14.2 01/13/2022 0729   HGB 15.2 12/12/2008 0804   HCT 42 05/15/2022 0000   HCT 44.1 01/13/2022 0729   HCT 43.8 12/12/2008 0804   PLT 143 (A) 05/15/2022 0000   PLT 155 01/13/2022 0729   MCV 97 01/13/2022 0729   MCV 90.8 12/12/2008 0804   MCH 31.2 01/13/2022 0729   MCH  31.5 01/01/2018 1215   MCHC 32.2 01/13/2022 0729   MCHC 32.0 01/01/2018 1215   RDW 11.7 01/13/2022 0729   RDW 13.6 12/12/2008 0804   Iron Studies    Component Value Date/Time   IRON 76 07/21/2008 0753   TIBC 295 07/21/2008 0753   FERRITIN 219 07/21/2008 0753   IRONPCTSAT 26 07/21/2008 0753   Lipid Panel     Component Value Date/Time   CHOL 274 (H) 04/23/2023 0941   TRIG 199 (H) 04/23/2023 0941   HDL 48 04/23/2023 0941   CHOLHDL 7.3 03/07/2008 0525   VLDL 38 03/07/2008 0525   LDLCALC 188 (H) 04/23/2023 0941   Hepatic Function Panel     Component Value Date/Time   PROT 6.2 10/29/2023 1154   ALBUMIN 4.1 10/29/2023 1154   AST 17 10/29/2023 1154   ALT 11 10/29/2023 1154   ALKPHOS 108 10/29/2023 1154   BILITOT 0.7 10/29/2023 1154   BILIDIR 0.6 (H) 03/09/2008 0500   IBILI 4.2 (H) 03/06/2008 1500      Component Value Date/Time   TSH 1.370 04/23/2023 0941   Nutritional Lab Results  Component Value Date   VD25OH 39.7 04/23/2023   VD25OH 28.0 05/15/2022   VD25OH 78.4 01/13/2022     Assessment and Plan    Influenza Symptoms include vomiting, diarrhea, body aches, and chills. Managing with hydration, Mucinex, and Tylenol . Advised to monitor for breathing difficulties. She is not a candidate for antivirals due to length of symptoms. - Continue hydration and rest. - Use Tylenol  for fever and body aches. - Seek further evaluation if breathing difficulties occur.  Dehydration Dehydration likely contributing to hypotension and dizziness. Increasing water intake to address this. - Increase fluid intake. - Adjust medications as needed based on dehydration symptoms.  Hypertension Hypotension likely due to dehydration. Adjusting antihypertensive medication, particularly Lasix , to prevent further dehydration. - Monitor blood pressure. - Adjust antihypertensive medication, especially Lasix , during dehydration.  Obesity Unable to take Mounjaro  due to supply issues.  Discussed potential transition to Zepbound , FDA approved for sleep apnea and may aid in weight loss. Skipping a dose of Mounjaro  is acceptable while ill. -Hold Mounjaro  for now -Refer for sleep study -will get back to her journaling plan when she recovers, 1300 cal and 85+ gm protein goals  Sleep Apnea Higher risk for sleep apnea impacting weight and metabolism. Referral to sleep specialist for evaluation and potential sleep study. - Refer to sleep specialist for consultation and potential sleep study.         She was informed of the importance of frequent follow up visits to maximize her success with intensive lifestyle modifications for her multiple health conditions.    Jasmine Mesi, MD

## 2024-03-11 ENCOUNTER — Other Ambulatory Visit (INDEPENDENT_AMBULATORY_CARE_PROVIDER_SITE_OTHER): Payer: Self-pay | Admitting: Family Medicine

## 2024-03-11 DIAGNOSIS — E559 Vitamin D deficiency, unspecified: Secondary | ICD-10-CM

## 2024-03-30 ENCOUNTER — Encounter: Payer: Self-pay | Admitting: Neurology

## 2024-03-30 ENCOUNTER — Ambulatory Visit (INDEPENDENT_AMBULATORY_CARE_PROVIDER_SITE_OTHER): Admitting: Neurology

## 2024-03-30 VITALS — BP 139/58 | HR 75 | Ht 61.0 in | Wt 337.0 lb

## 2024-03-30 DIAGNOSIS — G478 Other sleep disorders: Secondary | ICD-10-CM | POA: Diagnosis not present

## 2024-03-30 DIAGNOSIS — Z9189 Other specified personal risk factors, not elsewhere classified: Secondary | ICD-10-CM | POA: Diagnosis not present

## 2024-03-30 DIAGNOSIS — R0683 Snoring: Secondary | ICD-10-CM

## 2024-03-30 NOTE — Progress Notes (Signed)
 SLEEP MEDICINE CLINIC    Provider:  Neomia Banner, MD  Primary Care Physician:  Margarete Sharps, MD [address] [place] [address]     Referring Provider: Glenora Laos, Md [address] [place],  [place] [address]          Chief Complaint according to patient   Patient presents with:     New Patient (Initial Visit)     Pt is well, here with her sister -in law-, reports today that her daughter has told her that she snores. She she finds herself gasping and waking herself up.  She does sleep on her back often.  She gets about 4-5 hrs of sleep a night. She never feels well rested.       HISTORY OF PRESENT ILLNESS:  Maria Stone is a 69 y.o. female patient who is seen upon referral on 03/30/2024 from Dr. Alvia Awkward, MD,  for a sleep consultation..  Chief concern according to patient : " I am needing to be checked for sleep apnea to help with weight loss. "   I have the pleasure of seeing Maria Stone 03/30/24 a right-handed female with a possible sleep disorder, who struggles with Obesity. The patient never had sleep study.    Sleep relevant medical history: Snoring and sleep choking, Nocturia 3-4 , no Tonsillectomy, asthma, deviated septum without repair.   Family medical /sleep history: Brother  on CPAP with OSA, father likely had OSA,  CHF, insomnia.    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 Family status is divorced, with one adult daughter,  living in [place].  Tobacco use; quit 40 years ago.  ETOH use ; rare ,  Caffeine intake in form of Coffee( 1 cup / ) Soda( 1 a day) Tea ( /) or energy drinks Exercise in form of walking, limited ability.         Sleep habits are as follows:  The patient's dinner time is between 5-7 PM. The patient goes to bed at 11 PM and continues to struggle many days until 1-2 AM to sleep- to sleep for 5-7 hours, wakes for  many bathroom breaks.  RLS many nights.  The preferred sleep position is supine, reclined , with the support of 2-3 pillows.  Dreams are reportedly infrequent.  The patient wakes up with an alarm. 7.0 - 8  AM is the usual rise time. She reports not feeling refreshed or restored in AM, with symptoms such as dry mouth, morning headaches, and residual fatigue.  She hurts a lot, is stiff.  Naps are taken infrequently, in PM while watching TV, lasting from 10 to 30 minutes and are  refreshing than nocturnal sleep.    Review of Systems: Out of a complete 14 system review, the patient complains of only the following symptoms, and all other reviewed systems are negative.:  Fatigue, sleepiness , snoring, fragmented sleep, RLS more days than not, Nocturia frequently    How likely are you to doze in the following situations: 0 = not likely, 1 = slight chance, 2 = moderate chance, 3 = high chance   Sitting and Reading? Watching Television? Sitting inactive in a public place (theater or meeting)? As a passenger in a car for an hour without a break? Lying down in the afternoon when circumstances permit? Sitting and talking to  someone? Sitting quietly after lunch without alcohol? In a car, while stopped for a few minutes in traffic?   Total = 16/ 24 points   FSS endorsed at NA/ 63 points.  GDS: 4/ 15   Social History   Socioeconomic History   Marital status: Divorced    Spouse name: Not on file   Number of children: Not on file   Years of education: Not on file   Highest education level: Not on file  Occupational History   Occupation: Retired  Tobacco Use   Smoking status: Former    Types: Cigarettes   Smokeless tobacco: Never   Tobacco comments:    07/01/1976  Vaping Use   Vaping status: Never Used  Substance and Sexual Activity   Alcohol use: No   Drug use: No   Sexual activity: Not on file  Other Topics Concern   Not on file  Social History Narrative   Not on file   Social  Drivers of Health   Financial Resource Strain: Not on file  Food Insecurity: Not on file  Transportation Needs: Not on file  Physical Activity: Not on file  Stress: Not on file  Social Connections: Not on file    Family History  Problem Relation Age of Onset   Diabetes Father    Hyperlipidemia Father    Hypertension Father    Heart disease Father    Kidney disease Father    Obesity Father    Sleep apnea Brother    Diabetes Other     Past Medical History:  Diagnosis Date   Anxiety    Arthritis    Asthma    Chronic pain    Chronic pain    Family history of adverse reaction to anesthesia    PONV for pt mother   Heart murmur    Hemolytic anemia (HCC)    Hyperlipidemia    Hypertension    Joint pain    Knee pain, bilateral    Lower extremity edema    Mass    right middle finger   Morbid obesity (HCC)    Osteoarthritis    Osteoporosis    Perirectal abscess    Venous stasis     Past Surgical History:  Procedure Laterality Date   COLONOSCOPY     INCISION AND DRAINAGE PERIRECTAL ABSCESS  05/25/2012   Procedure: IRRIGATION AND DEBRIDEMENT PERIRECTAL ABSCESS;  Surgeon: Azucena Bollard, MD;  Location: [facility];  Service: General;  Laterality: N/A;   MASS EXCISION Right 01/01/2018   Procedure: EXCISION MASS RIGHT MIDDLE FINGER;  Surgeon: Lyanne Sample, MD;  Location: [facility];  Service: Orthopedics;  Laterality: Right;     Current Outpatient Medications on File Prior to Visit  Medication Sig Dispense Refill   acetaminophen  (TYLENOL ) 500 MG tablet Take 1,000 mg by mouth every 6 (six) hours as needed for moderate pain or headache.      allopurinol (ZYLOPRIM) 100 MG tablet Take 100 mg by mouth daily.     Coenzyme Q10 (EQL COQ10) 400 MG CAPS Take 1 capsule by mouth daily. Take 1 capsule by mouth once daily.     FLUoxetine  HCl 60 MG TABS Take 1 capsule by mouth daily. 90 tablet 0   folic acid  (FOLVITE ) 1 MG tablet Take 1 mg by mouth daily.     furosemide  (LASIX ) 20 MG tablet 1  DAILY AS DIRECTED ORAL ONCE A DAY 90 DAYS ORALLY ONCE A DAY 90 DAYS 30 tablet 0   gabapentin (NEURONTIN)  300 MG capsule Take 300 mg by mouth 3 (three) times daily.     gabapentin (NEURONTIN) 600 MG tablet Take 600 mg by mouth 3 (three) times daily.     lisinopril  (ZESTRIL ) 10 MG tablet Take 1 tablet (10 mg total) by mouth daily. 90 tablet 0   loratadine (CLARITIN) 10 MG tablet Take 10 mg by mouth daily.     Multiple Vitamin (MULTIVITAMIN WITH MINERALS) TABS Take 1 tablet by mouth daily.     Multiple Vitamins-Minerals (AIRBORNE GUMMIES PO) Take 1 each by mouth daily.     NON FORMULARY Standard Process E-Z Mg     PROAIR  HFA 108 (90 Base) MCG/ACT inhaler Inhale 2 puffs into the lungs every 6 (six) hours as needed for shortness of breath or wheezing.  3   rosuvastatin (CRESTOR) 5 MG tablet Take 5 mg by mouth daily. Pt takes one every other day, Monday Wednesday and Friday.     tirzepatide  (MOUNJARO ) 5 MG/0.5ML Pen Inject 5 mg into the skin once a week.     Vitamin D , Ergocalciferol , (DRISDOL ) 1.25 MG (50000 UNIT) CAPS capsule TAKE 1 CAPSULE BY MOUTH EVERY 7 DAYS 12 capsule 0   No current facility-administered medications on file prior to visit.    Allergies  Allergen Reactions   Nsaids Anaphylaxis   Tolmetin Anaphylaxis   Contrast Media [Iodinated Contrast Media] Swelling   Penicillins Other (See Comments)    Childhood allergy   Simvastatin     Other reaction(s): joint pain   Codeine Rash   Doxycycline  Rash     DIAGNOSTIC DATA (LABS, IMAGING, TESTING) - I reviewed patient records, labs, notes, testing and imaging myself where available.  Lab Results  Component Value Date   WBC 4.8 05/15/2022   HGB 14.1 05/15/2022   HCT 42 05/15/2022   MCV 97 01/13/2022   PLT 143 (A) 05/15/2022      Component Value Date/Time   NA 142 10/29/2023 1154   K 5.4 (H) 10/29/2023 1154   CL 103 10/29/2023 1154   CO2 25 10/29/2023 1154   GLUCOSE 91 10/29/2023 1154   GLUCOSE 93 01/01/2018 1312   BUN  41 (H) 10/29/2023 1154   CREATININE 1.25 (H) 10/29/2023 1154   CALCIUM 10.0 10/29/2023 1154   PROT 6.2 10/29/2023 1154   ALBUMIN 4.1 10/29/2023 1154   AST 17 10/29/2023 1154   ALT 11 10/29/2023 1154   ALKPHOS 108 10/29/2023 1154   BILITOT 0.7 10/29/2023 1154   GFRNONAA 55 (L) 10/15/2020 0806   GFRAA 63 10/15/2020 0806   Lab Results  Component Value Date   CHOL 274 (H) 04/23/2023   HDL 48 04/23/2023   LDLCALC 188 (H) 04/23/2023   TRIG 199 (H) 04/23/2023   CHOLHDL 7.3 03/07/2008   Lab Results  Component Value Date   HGBA1C 5.4 04/23/2023   Lab Results  Component Value Date   VITAMINB12 921 04/23/2023   Lab Results  Component Value Date   TSH 1.370 04/23/2023    PHYSICAL EXAM:  Today's Vitals   03/30/24 0834  BP: (!) 139/58  Pulse: 75  Weight: (!) 337 lb (152.9 kg)  Height: 5\' 1"  (1.549 m)   Body mass index is 63.68 kg/m.   Wt Readings from Last 3 Encounters:  03/30/24 (!) 337 lb (152.9 kg)  02/04/24 (!) 347 lb (157.4 kg)  01/07/24 (!) 348 lb (157.9 kg)     Ht Readings from Last 3 Encounters:  03/30/24 5\' 1"  (1.549 m)  03/03/24 5\' 1"  (  1.549 m)  02/04/24 5\' 1"  (1.549 m)      General: The patient is awake, alert and appears not in acute distress. The patient is well groomed. Head: Normocephalic, atraumatic. Neck is supple.  Mallampati 2-3,  neck circumference:15 inches .  Nasal airflow  is patent.  Retrognathia is not  seen.  Dental status: biological - Overbite  Cardiovascular:  Regular rate and cardiac rhythm by pulse,  without distended neck veins. Respiratory: Lungs are clear to auscultation.  Skin:  Without evidence of ankle edema, or rash. Trunk: The patient's posture is erect.   NEUROLOGIC EXAM: The patient is awake and alert, oriented to place and time.   Memory subjective described as intact.  Attention span & concentration ability appears normal.  Speech is fluent,  without  dysarthria, dysphonia or aphasia.  Mood and affect are  appropriate.   Cranial nerves: no loss of smell or taste reported  Pupils are equal and briskly reactive to light. Funduscopic exam deferred.  Extraocular movements in vertical and horizontal planes were intact and without nystagmus. No Diplopia. Visual fields by finger perimetry are intact. Hearing was intact to soft voice and finger rubbing. Facial sensation intact to fine touch. Facial motor strength is symmetric and tongue and uvula move midline.  Neck ROM : rotation, tilt and flexion extension were normal for age and shoulder shrug was symmetrical.    Motor exam:  Symmetric bulk, tone and ROM.   Normal tone without cog wheeling, symmetric grip strength .   Sensory:   vibration was present in both ankles.  Proprioception tested in the upper extremities was normal.   Coordination: Rapid alternating movements in the fingers/hands were of normal speed.  The Finger-to-nose maneuver was impaired on the right , with evidence of dysmetria . There is ore pronation on the right.   Gait and station: Patient could  not rise unassisted from a seated position, presents in a wheelchair.  Deep tendon reflexes: in the  lower extremities were not obtained.     ASSESSMENT AND PLAN:  Mrs Groner,  a 68 y.o. year -old  retired Caucasian female was seen here at  request of dr Lenward Railing. She is at high risk for OSA and obesity hypoventilation.  She is not diabetic but has Nocturia, she has been severely limited in her ambulatory ability.    She already lost 60 plus pounds and was able to leave the electric wheelchair and move short distances with a cane.     1) Excessive daytime sleepiness, more fatigued and not depressed by her subjective score ratings.   2) snoring is present, indicated by dry mouth, and her sleep is fragmented by nocturia ( not diabetic !)  but on lasix .  She has significant ankle edema.   3) Her sleep study can be performed in the lab or at home,  she prefers HST-I will order a  watch pat for her.   Goal is to treat apnea if found to help with establishing better sleep habits and to  help with authorization of Zepbound  , FDA approved for moderate sleep apnea patients with obesity and main risk factor of obesity.   We will follow in 3-5 months.   I would like to thank Margarete Sharps, MD and Jasmine Mesi D, Md [address] [place],  [place] [address] for allowing me to meet with and to take care of this pleasant patient.   After spending a total time of  45  minutes face to face  and additional time for physical and neurologic examination, review of laboratory studies,  personal review of imaging studies, reports and results of other testing and review of referral information / records as far as provided in visit,   Electronically signed by: Neomia Banner, MD 03/30/2024 8:47 AM  [facility] Board certified by The [employer] of Sleep Medicine and Diplomate of the [employer] of Sleep Medicine. Board certified In Neurology through the ABPN, Fellow of the [employer] of Neurology.

## 2024-03-30 NOTE — Patient Instructions (Signed)
 ASSESSMENT AND PLAN:    Maria Stone,  a 68 y.o. year -old  retired Caucasian female was seen here at  request of dr Lenward Railing. She is at high risk for OSA and obesity hypoventilation.  She is not diabetic but has Nocturia, she has been severely limited in her ambulatory ability.    She already lost 60 plus pounds and was able to leave the electric wheelchair and move short distances with a cane.      1) Excessive daytime sleepiness, more fatigued and not depressed by her subjective score ratings.    2) snoring is present, indicated by dry mouth, and her sleep is fragmented by nocturia ( not diabetic !)  but on lasix .  She has significant ankle edema.    3) Her sleep study can be performed in the lab or at home,  she prefers [facility]ST-I will order a watch pat for her.    Goal is to treat apnea if found to help with establishing better sleep habits and to  help with authorization of Zepbound  , FDA approved for moderate sleep apnea patients with obesity and main risk factor of obesity.    We will follow in 3-5 months.

## 2024-03-31 ENCOUNTER — Encounter (INDEPENDENT_AMBULATORY_CARE_PROVIDER_SITE_OTHER): Payer: Self-pay | Admitting: Family Medicine

## 2024-03-31 ENCOUNTER — Ambulatory Visit (INDEPENDENT_AMBULATORY_CARE_PROVIDER_SITE_OTHER): Payer: Medicare Other | Admitting: Family Medicine

## 2024-03-31 VITALS — BP 134/71 | HR 90 | Temp 98.2°F | Ht 61.0 in | Wt 333.0 lb

## 2024-03-31 DIAGNOSIS — R600 Localized edema: Secondary | ICD-10-CM | POA: Diagnosis not present

## 2024-03-31 DIAGNOSIS — F32A Depression, unspecified: Secondary | ICD-10-CM

## 2024-03-31 DIAGNOSIS — Z6841 Body Mass Index (BMI) 40.0 and over, adult: Secondary | ICD-10-CM

## 2024-03-31 DIAGNOSIS — F418 Other specified anxiety disorders: Secondary | ICD-10-CM | POA: Diagnosis not present

## 2024-03-31 DIAGNOSIS — R0683 Snoring: Secondary | ICD-10-CM

## 2024-03-31 DIAGNOSIS — I10 Essential (primary) hypertension: Secondary | ICD-10-CM

## 2024-03-31 DIAGNOSIS — E669 Obesity, unspecified: Secondary | ICD-10-CM | POA: Diagnosis not present

## 2024-03-31 MED ORDER — FLUOXETINE HCL 60 MG PO TABS: 1.0000 | ORAL_TABLET | Freq: Every day | ORAL | 0 refills | Status: DC

## 2024-03-31 MED ORDER — FUROSEMIDE 20 MG PO TABS: ORAL_TABLET | ORAL | 0 refills | Status: DC

## 2024-03-31 MED ORDER — LISINOPRIL 10 MG PO TABS
10.0000 mg | ORAL_TABLET | Freq: Every day | ORAL | 0 refills | Status: DC
Start: 2024-03-31 — End: 2024-06-08

## 2024-03-31 NOTE — Progress Notes (Signed)
 Office: [phone]  /  Fax: [phone]  WEIGHT SUMMARY AND BIOMETRICS  Anthropometric Measurements Height: 5\' 1"  (1.549 m) Weight: (!) 333 lb (151 kg) BMI (Calculated): 62.95 Weight at Last Visit: 347lb Weight Lost Since Last Visit: 14lb Weight Gained Since Last Visit: 0 Starting Weight: 348lb Total Weight Loss (lbs): 15 lb (6.804 kg)   Body Composition  Body Fat %: 67.3 % Fat Mass (lbs): 224.2 lbs Muscle Mass (lbs): 103.4 lbs Visceral Fat Rating : 33   Other Clinical Data Fasting: yes Labs: no Today's Visit #: 55 Starting Date: 12/30/18    Chief Complaint: OBESITY    History of Present Illness Maria Stone is a 68 year old female with obesity and hypertension who presents for obesity treatment and progress assessment.  She has been following a low-carb eating plan for the past month, although adherence was affected by a recent illness. She has lost fourteen pounds over the last month. She is working on diet and exercise to aid in weight loss and improve her blood pressure.  She has a history of hypertension and is currently taking lisinopril  10 mg daily. She requests a refill of her medication.  She has a history of depression with emotional eating behaviors and is currently taking fluoxetine  60 mg daily. She requests a refill of this medication as well.  She experienced a prolonged illness with the flu, lasting almost three weeks, which significantly affected her stamina and ability to eat. She felt isolated during this time, unable to spend time with her family during Easter and her daughter's birthday.  She recently visited another doctor for evaluation of sleep apnea and a home sleep test was ordered; she is awaiting the opportunity to complete the test.      PHYSICAL EXAM:  Blood pressure 134/71, pulse 90, temperature 98.2 F (36.8 C), height 5\' 1"  (1.549 m), weight (!) 333 lb (151 kg), SpO2 95%. Body mass index is 62.92 kg/m.  DIAGNOSTIC  DATA REVIEWED:  BMET    Component Value Date/Time   NA 142 10/29/2023 1154   K 5.4 (H) 10/29/2023 1154   CL 103 10/29/2023 1154   CO2 25 10/29/2023 1154   GLUCOSE 91 10/29/2023 1154   GLUCOSE 93 01/01/2018 1312   BUN 41 (H) 10/29/2023 1154   CREATININE 1.25 (H) 10/29/2023 1154   CALCIUM 10.0 10/29/2023 1154   GFRNONAA 55 (L) 10/15/2020 0806   GFRAA 63 10/15/2020 0806   Lab Results  Component Value Date   HGBA1C 5.4 04/23/2023   HGBA1C (L) 03/08/2008    3.6 (NOTE)   The ADA recommends the following therapeutic goals for glycemic   control related to Hgb A1C measurement:   Goal of Therapy:   < 7.0% Hgb A1C   Action Suggested:  > 8.0% Hgb A1C   Ref:  Diabetes Care, 22, Suppl. 1, 1999   Lab Results  Component Value Date   INSULIN  10.6 04/23/2023   INSULIN  16.9 12/30/2018   Lab Results  Component Value Date   TSH 1.370 04/23/2023   CBC    Component Value Date/Time   WBC 4.8 05/15/2022 0000   WBC 6.2 01/01/2018 1215   RBC 4.2 05/15/2022 0000   HGB 14.1 05/15/2022 0000   HGB 14.2 01/13/2022 0729   HGB 15.2 12/12/2008 0804   HCT 42 05/15/2022 0000   HCT 44.1 01/13/2022 0729   HCT 43.8 12/12/2008 0804   PLT 143 (A) 05/15/2022 0000   PLT 155 01/13/2022 0729   MCV  97 01/13/2022 0729   MCV 90.8 12/12/2008 0804   MCH 31.2 01/13/2022 0729   MCH 31.5 01/01/2018 1215   MCHC 32.2 01/13/2022 0729   MCHC 32.0 01/01/2018 1215   RDW 11.7 01/13/2022 0729   RDW 13.6 12/12/2008 0804   Iron Studies    Component Value Date/Time   IRON 76 07/21/2008 0753   TIBC 295 07/21/2008 0753   FERRITIN 219 07/21/2008 0753   IRONPCTSAT 26 07/21/2008 0753   Lipid Panel     Component Value Date/Time   CHOL 274 (H) 04/23/2023 0941   TRIG 199 (H) 04/23/2023 0941   HDL 48 04/23/2023 0941   CHOLHDL 7.3 03/07/2008 0525   VLDL 38 03/07/2008 0525   LDLCALC 188 (H) 04/23/2023 0941   Hepatic Function Panel     Component Value Date/Time   PROT 6.2 10/29/2023 1154   ALBUMIN 4.1 10/29/2023  1154   AST 17 10/29/2023 1154   ALT 11 10/29/2023 1154   ALKPHOS 108 10/29/2023 1154   BILITOT 0.7 10/29/2023 1154   BILIDIR 0.6 (H) 03/09/2008 0500   IBILI 4.2 (H) 03/06/2008 1500      Component Value Date/Time   TSH 1.370 04/23/2023 0941   Nutritional Lab Results  Component Value Date   VD25OH 39.7 04/23/2023   VD25OH 28.0 05/15/2022   VD25OH 78.4 01/13/2022     Assessment and Plan Assessment & Plan Obesity Obesity management focuses on weight loss through dietary changes and exercise. She has lost 14 pounds in the last month, partly due to illness. She follows a low-carb diet and is considering transitioning to a category three eating plan for more variety, including fruits. Emphasized adherence to the plan for continued weight loss and the benefits of incorporating exercise, such as chair yoga, to improve overall health. - Transition to category three eating plan. - Provide a copy of the category three eating plan and a grocery list. - Encourage exploration of chair yoga videos for exercise.  Snoring with potential sleep apnea She is being evaluated for sleep apnea, and a home sleep test has been ordered. The results will determine eligibility for Zepbound  treatment, FDA approved for sleep apnea. Awaiting the home sleep test kit, expected in 1-2 weeks. Discussed the test process and the importance of timely result submission for evaluation. - Await home sleep test kit and complete the test. - Ensure the test results are sent for evaluation. - If criteria are met, consider Zepbound  treatment for sleep apnea.  Hypertension Hypertension is managed with lisinopril  10 mg daily. Blood pressure is 134/71 mmHg, indicating good control. Weight loss and dietary changes are part of the strategy to further improve blood pressure control. - Refill lisinopril  10 mg daily.  Depression Depression is managed with fluoxetine  60 mg daily. Emotional eating behaviors are addressed as part of  obesity management. - Refill fluoxetine  60 mg daily.      She was informed of the importance of frequent follow up visits to maximize her success with intensive lifestyle modifications for her multiple health conditions.    Jasmine Mesi, MD

## 2024-04-19 ENCOUNTER — Ambulatory Visit (INDEPENDENT_AMBULATORY_CARE_PROVIDER_SITE_OTHER): Admitting: Neurology

## 2024-04-19 DIAGNOSIS — G478 Other sleep disorders: Secondary | ICD-10-CM

## 2024-04-19 DIAGNOSIS — G4733 Obstructive sleep apnea (adult) (pediatric): Secondary | ICD-10-CM

## 2024-04-19 DIAGNOSIS — R0683 Snoring: Secondary | ICD-10-CM

## 2024-04-19 DIAGNOSIS — Z6841 Body Mass Index (BMI) 40.0 and over, adult: Secondary | ICD-10-CM

## 2024-04-19 DIAGNOSIS — Z9189 Other specified personal risk factors, not elsewhere classified: Secondary | ICD-10-CM

## 2024-04-20 ENCOUNTER — Encounter (INDEPENDENT_AMBULATORY_CARE_PROVIDER_SITE_OTHER): Payer: Self-pay | Admitting: Family Medicine

## 2024-04-20 DIAGNOSIS — G4733 Obstructive sleep apnea (adult) (pediatric): Secondary | ICD-10-CM

## 2024-04-20 DIAGNOSIS — R0683 Snoring: Secondary | ICD-10-CM

## 2024-04-20 NOTE — Telephone Encounter (Signed)
 2 messages, aware that Dr Alvia Awkward is out of the office

## 2024-04-20 NOTE — Telephone Encounter (Signed)
 See message about sleep study

## 2024-04-20 NOTE — Progress Notes (Signed)
 [facility]  Maria Stone 68 year old female 1956-10-10   HOME SLEEP TEST REPORT ( by Watch PAT)   STUDY DATE: 6 - 10 - 25   ORDERING CLINICIAN:  REFERRING CLINICIAN:    CLINICAL INFORMATION/HISTORY: This patient was seen on 30 Mar 2024, referred by Dr. Loralyn Stone, by the [facility].  The concern for this patient is that she is extremely obese and at risk for alveolar hypoventilation, her BMI exceeded 63,.  In order to facilitate weight loss it could be beneficial to identify if sleep apnea is present and then to treat sleep apnea accordingly.  There are also medications now FDA approved for the treatment of sleep apnea in obese patients..  The patient endorsed snoring, sleep choking, nocturia 3-4 times each night.  She also has a strong family history in her brother she reports that her father very likely had obstructive sleep apnea and died of congestive heart failure.  The patient is a former shift Financial controller.     Epworth sleepiness score:16/ 24 points   FSS endorsed at NA/ 63 points.  GDS: 4/ 15      BMI: kg/m 64   Neck Circumference: 15 inches , with a mild overbite   FINDINGS:   Sleep Summary:   Total Recording Time (hours, min):     9 hours 5 minutes   Total Sleep Time (hours, min): 8 hours 3 minutes             Percent REM (%):    25%                                    Respiratory Indices:   Calculated pAHI (per CMS guideline): 15.4/h                     REM pAHI:      29/h                                           NREM pAHI:     11/h                         Positional AHI:   The patient slept the majority of the night in supine position,  there was loud snoring noted snoring was loudest during REM sleep.                                       Oxygen Saturation Statistics:   Oxygen Saturation (%) Mean:      91%          O2 Saturation Range (%):     Between the nadir of 77% with a  maximal saturation of 98%.                                  O2 Saturation (minutes) <89%:   20 minutes        Pulse Rate Statistics:   Pulse Mean (bpm):     65 bpm  Pulse Range:      Ranging from 53 to 100 bpm.           IMPRESSION:  This HST confirms the presence of moderate all obstructive sleep apnea with a strong REM sleep dependency which can be seen in obesity hypoventilation patients.  The nadir of oxygen and the total duration of hypoxia also speak for an moderate severity of hypoxia.   RECOMMENDATION: I like for this patient to start as soon as possible on positive airway pressure therapy, I will order an auto titration CPAP device by [employer], S11, with a setting between 6 and 16 cm water pressure, 2 cm EPR, heated humidification and an interface to be fitted.  Compliance with CPAP is defined as 4 hours or more of nightly use.  A follow-up visit sleep clinic would be needed around day 90 of CPAP therapy.  The long-term goal for this patient needs to be weight loss and the diagnosis of obstructive sleep apnea itself may be helpful in obtaining medication to facilitate weight loss.    INTERPRETING PHYSICIAN:   Neomia Banner, MD  [facility] Sleep Board certified by The [employer] of Sleep Medicine and Diplomate of the [employer] of Sleep Medicine. Board certified In Neurology through the ABPN, Fellow of the [employer] of Neu[employer].

## 2024-04-24 ENCOUNTER — Encounter (INDEPENDENT_AMBULATORY_CARE_PROVIDER_SITE_OTHER): Payer: Self-pay | Admitting: Family Medicine

## 2024-04-24 ENCOUNTER — Ambulatory Visit: Payer: Self-pay | Admitting: Neurology

## 2024-04-24 DIAGNOSIS — G4733 Obstructive sleep apnea (adult) (pediatric): Secondary | ICD-10-CM

## 2024-04-24 DIAGNOSIS — Z9189 Other specified personal risk factors, not elsewhere classified: Secondary | ICD-10-CM

## 2024-04-24 DIAGNOSIS — R0683 Snoring: Secondary | ICD-10-CM

## 2024-04-24 NOTE — Procedures (Signed)
 [facility]  Maria Stone 68 year old female 27-Jul-1956   HOME SLEEP TEST REPORT ( by Watch PAT)   STUDY DATE: 6 - 10 - 25   ORDERING CLINICIAN:  REFERRING CLINICIAN:    CLINICAL INFORMATION/HISTORY: This patient was seen on 30 Mar 2024, referred by Dr. Loralyn Rochester, by the [facility] [place].  The concern for this patient is that she is extremely obese and at risk for alveolar hypoventilation, her BMI exceeded 63,.  In order to facilitate weight loss it could be beneficial to identify if sleep apnea is present and then to treat sleep apnea accordingly.  There are also medications now FDA approved for the treatment of sleep apnea in obese patients..  The patient endorsed snoring, sleep choking, nocturia 3-4 times each night.  She also has a strong family history in her brother she reports that her father very likely had obstructive sleep apnea and died of congestive heart failure.  The patient is a former shift [employer].     Epworth sleepiness score:16/ 24 points   FSS endorsed at NA/ 63 points.  GDS: 4/ 15      BMI: kg/m 64   Neck Circumference: 15 inches , with a mild overbite   FINDINGS:   Sleep Summary:   Total Recording Time (hours, min):     9 hours 5 minutes   Total Sleep Time (hours, min): 8 hours 3 minutes             Percent REM (%):    25%                                    Respiratory Indices:   Calculated pAHI (per CMS guideline): 15.4/h                     REM pAHI:      29/h                                           NREM pAHI:     11/h                         Positional AHI:   The patient slept the majority of the night in supine position,  there was loud snoring noted snoring was loudest during REM sleep.                                       Oxygen Saturation Statistics:   Oxygen Saturation (%) Mean:      91%          O2 Saturation Range (%):     Between the nadir of 77% with a maximal  saturation of 98%.                                  O2 Saturation (minutes) <89%:   20 minutes        Pulse Rate Statistics:   Pulse Mean (bpm):     65 bpm  Pulse Range:      Ranging from 53 to 100 bpm.           IMPRESSION:  This HST confirms the presence of moderate all obstructive sleep apnea with a strong REM sleep dependency which can be seen in obesity hypoventilation patients.  The nadir of oxygen and the total duration of hypoxia also speak for an moderate severity of hypoxia.   RECOMMENDATION: I like for this patient to start as soon as possible on positive airway pressure therapy, I will order an auto titration CPAP device by [employer], S11, with a setting between 6 and 16 cm water pressure, 2 cm EPR, heated humidification and an interface to be fitted.  Compliance with CPAP is defined as 4 hours or more of nightly use.  A follow-up visit sleep clinic would be needed around day 90 of CPAP therapy.  The long-term goal for this patient needs to be weight loss and the diagnosis of obstructive sleep apnea itself may be helpful in obtaining medication to facilitate weight loss.    INTERPRETING PHYSICIAN:   Neomia Banner, MD  [facility] Sleep Board certified by The [employer] of Sleep Medicine and Diplomate of the [employer] of Sleep Medicine. Board certified In Neurology through the ABPN, Fellow of the [employer] of Neu[employer].

## 2024-04-25 NOTE — Telephone Encounter (Signed)
 Please start PA for Zepbound  5 mg x 1 month for OSA with AHI 15.4 per recent sleep study in Epic

## 2024-04-25 NOTE — Telephone Encounter (Signed)
 Good afternoon!  Patient states that the sleep study has been uploaded to MyChart and patient is requesting a quick call from Dr. B or for the prescription to be called in. I told her she has to be seen for the prescription to be called in and got her on the schedule for next week.  I also told her Dr. Geraldene Kleine may not have time today for a call.    Thanks!

## 2024-04-26 ENCOUNTER — Telehealth (INDEPENDENT_AMBULATORY_CARE_PROVIDER_SITE_OTHER): Payer: Self-pay

## 2024-04-26 NOTE — Telephone Encounter (Signed)
 Prior auth started for Zepbound  5 mg.  Awaiting questions.

## 2024-04-26 NOTE — Telephone Encounter (Signed)
 Prior auth for Zepbound  has been submitted.  Awaiting response.

## 2024-04-27 ENCOUNTER — Other Ambulatory Visit (INDEPENDENT_AMBULATORY_CARE_PROVIDER_SITE_OTHER): Payer: Self-pay | Admitting: Family Medicine

## 2024-04-27 DIAGNOSIS — R600 Localized edema: Secondary | ICD-10-CM

## 2024-04-27 DIAGNOSIS — G4733 Obstructive sleep apnea (adult) (pediatric): Secondary | ICD-10-CM

## 2024-04-27 MED ORDER — TIRZEPATIDE-WEIGHT MANAGEMENT 5 MG/0.5ML ~~LOC~~ SOAJ: 5.0000 mg | SUBCUTANEOUS | 0 refills | Status: DC

## 2024-04-27 NOTE — Telephone Encounter (Signed)
 Called patient to discuss sleep study results. No answer at this time. LVM for the patient to call back.  Will send a mychart message as well.

## 2024-04-27 NOTE — Telephone Encounter (Signed)
 Message from Plan Request Reference Number: [id number]. ZEPBOUND  INJ 5/0.5ML is approved through 11/09/2024. Your patient may now fill this prescription and it will be covered.. Authorization Expiration Date: November 09, 2024. Patient has been updated thru MyChart.

## 2024-04-27 NOTE — Telephone Encounter (Signed)
 Message from Plan:   Request Reference Number: [id number]. ZEPBOUND  INJ 5/0.5ML is approved through 11/09/2024. Your patient may now fill this prescription and it will be covered.. Authorization Expiration Date: November 09, 2024

## 2024-04-27 NOTE — Telephone Encounter (Signed)
-----   Message from Sunnyside Dohmeier sent at 04/24/2024 10:21 PM EDT ----- This HST confirms the presence of moderate all obstructive sleep apnea with a strong REM sleep dependency which can be seen in obesity hypoventilation patients. The nadir of oxygen and the total  duration of hypoxia also speak for an moderate severity of REM dependent hypoxia.  I like for this patient to start as soon as possible on positive airway pressure therapy, I will order an auto titration CPAP device by [employer], S11, with a setting between 6 and 16 cm water pressure,  2 cm EPR, heated humidification and an interface to be fitted.   Compliance with CPAP is defined as 4 hours or more of nightly use.  A follow-up visit sleep clinic would be needed around day 90 of CPAP therapy.   ----- Message ----- From: Neomia Banner, MD Sent: 04/24/2024  10:18 PM EDT To: Neomia Banner, MD

## 2024-04-27 NOTE — Telephone Encounter (Signed)
 Dr Alvia Awkward, do you know anything about an Epipen? Does that need to be sent in, or was she referring to the Zepbound ?

## 2024-05-02 ENCOUNTER — Ambulatory Visit (INDEPENDENT_AMBULATORY_CARE_PROVIDER_SITE_OTHER): Admitting: Family Medicine

## 2024-05-02 ENCOUNTER — Other Ambulatory Visit (INDEPENDENT_AMBULATORY_CARE_PROVIDER_SITE_OTHER): Payer: Self-pay | Admitting: Family Medicine

## 2024-05-02 DIAGNOSIS — E559 Vitamin D deficiency, unspecified: Secondary | ICD-10-CM

## 2024-05-05 ENCOUNTER — Encounter (INDEPENDENT_AMBULATORY_CARE_PROVIDER_SITE_OTHER): Payer: Self-pay | Admitting: Family Medicine

## 2024-05-05 ENCOUNTER — Ambulatory Visit (INDEPENDENT_AMBULATORY_CARE_PROVIDER_SITE_OTHER): Admitting: Family Medicine

## 2024-05-05 ENCOUNTER — Other Ambulatory Visit (HOSPITAL_COMMUNITY): Payer: Self-pay

## 2024-05-05 VITALS — BP 121/72 | HR 72 | Temp 98.2°F | Ht 61.0 in | Wt 327.0 lb

## 2024-05-05 DIAGNOSIS — G4733 Obstructive sleep apnea (adult) (pediatric): Secondary | ICD-10-CM | POA: Diagnosis not present

## 2024-05-05 DIAGNOSIS — R600 Localized edema: Secondary | ICD-10-CM

## 2024-05-05 DIAGNOSIS — E669 Obesity, unspecified: Secondary | ICD-10-CM

## 2024-05-05 DIAGNOSIS — F5089 Other specified eating disorder: Secondary | ICD-10-CM | POA: Diagnosis not present

## 2024-05-05 DIAGNOSIS — G250 Essential tremor: Secondary | ICD-10-CM | POA: Diagnosis not present

## 2024-05-05 DIAGNOSIS — F418 Other specified anxiety disorders: Secondary | ICD-10-CM

## 2024-05-05 DIAGNOSIS — Z6841 Body Mass Index (BMI) 40.0 and over, adult: Secondary | ICD-10-CM

## 2024-05-05 MED ORDER — FUROSEMIDE 20 MG PO TABS: ORAL_TABLET | ORAL | 0 refills | Status: DC

## 2024-05-05 MED ORDER — ZEPBOUND 5 MG/0.5ML ~~LOC~~ SOAJ
5.0000 mg | SUBCUTANEOUS | 0 refills | Status: DC
Start: 2024-05-05 — End: 2024-07-06
  Filled 2024-05-05 – 2024-05-20 (×3): qty 2, 28d supply, fill #0

## 2024-05-05 MED ORDER — FLUOXETINE HCL 60 MG PO TABS: 1.0000 | ORAL_TABLET | Freq: Every day | ORAL | 0 refills | Status: DC

## 2024-05-05 NOTE — Progress Notes (Signed)
 Office: [phone]  /  Fax: [phone]  WEIGHT SUMMARY AND BIOMETRICS  Anthropometric Measurements Height: 5' 1 (1.549 m) Weight: (!) 327 lb (148.3 kg) BMI (Calculated): 61.82 Weight at Last Visit: 333 lb Weight Lost Since Last Visit: 20 lb Weight Gained Since Last Visit: 0 Starting Weight: 348 lb Total Weight Loss (lbs): 21 lb (9.526 kg)   Body Composition  Body Fat %: 66.3 % Fat Mass (lbs): 217.4 lbs Muscle Mass (lbs): 104.8 lbs Visceral Fat Rating : 32   Other Clinical Data Fasting: No Labs: No Today's Visit #: 7 Starting Date: 12/30/18    Chief Complaint: OBESITY  History of Present Illness Maria Stone is a 68 year old female who presents for obesity treatment plan discussion.  She has been adhering to a category three eating plan 75% of the time and engages in chair exercises for 20 minutes, four times a week. She has lost six pounds over the last five weeks and is motivated to continue losing weight due to her recent progress.  She is currently on a new prescription of Zepbound  for obesity, having taken one dose so far. There was a delay in obtaining the medication due to pharmacy issues, but she plans to take the next dose tomorrow. She is also on fluoxetine  60 mg for emotional eating and Lasix  20 mg for lower extremity edema.  She experiences tremors in her hands, particularly noticeable when writing, which her family has observed. She suspects a possible link to her gabapentin use, which she has been attempting to reduce without noticing any improvement.  She has moderate sleep apnea and is contemplating the use of a CPAP machine, though she has some reservations.      PHYSICAL EXAM:  Blood pressure 121/72, pulse 72, temperature 98.2 F (36.8 C), height 5' 1 (1.549 m), weight (!) 327 lb (148.3 kg), SpO2 97%. Body mass index is 61.79 kg/m.  DIAGNOSTIC DATA REVIEWED:  BMET    Component Value Date/Time   NA 142 10/29/2023 1154   K 5.4  (H) 10/29/2023 1154   CL 103 10/29/2023 1154   CO2 25 10/29/2023 1154   GLUCOSE 91 10/29/2023 1154   GLUCOSE 93 01/01/2018 1312   BUN 41 (H) 10/29/2023 1154   CREATININE 1.25 (H) 10/29/2023 1154   CALCIUM 10.0 10/29/2023 1154   GFRNONAA 55 (L) 10/15/2020 0806   GFRAA 63 10/15/2020 0806   Lab Results  Component Value Date   HGBA1C 5.4 04/23/2023   HGBA1C (L) 03/08/2008    3.6 (NOTE)   The ADA recommends the following therapeutic goals for glycemic   control related to Hgb A1C measurement:   Goal of Therapy:   < 7.0% Hgb A1C   Action Suggested:  > 8.0% Hgb A1C   Ref:  Diabetes Care, 22, Suppl. 1, 1999   Lab Results  Component Value Date   INSULIN  10.6 04/23/2023   INSULIN  16.9 12/30/2018   Lab Results  Component Value Date   TSH 1.370 04/23/2023   CBC    Component Value Date/Time   WBC 4.8 05/15/2022 0000   WBC 6.2 01/01/2018 1215   RBC 4.2 05/15/2022 0000   HGB 14.1 05/15/2022 0000   HGB 14.2 01/13/2022 0729   HGB 15.2 12/12/2008 0804   HCT 42 05/15/2022 0000   HCT 44.1 01/13/2022 0729   HCT 43.8 12/12/2008 0804   PLT 143 (A) 05/15/2022 0000   PLT 155 01/13/2022 0729   MCV 97 01/13/2022 0729   MCV 90.8  12/12/2008 0804   MCH 31.2 01/13/2022 0729   MCH 31.5 01/01/2018 1215   MCHC 32.2 01/13/2022 0729   MCHC 32.0 01/01/2018 1215   RDW 11.7 01/13/2022 0729   RDW 13.6 12/12/2008 0804   Iron Studies    Component Value Date/Time   IRON 76 07/21/2008 0753   TIBC 295 07/21/2008 0753   FERRITIN 219 07/21/2008 0753   IRONPCTSAT 26 07/21/2008 0753   Lipid Panel     Component Value Date/Time   CHOL 274 (H) 04/23/2023 0941   TRIG 199 (H) 04/23/2023 0941   HDL 48 04/23/2023 0941   CHOLHDL 7.3 03/07/2008 0525   VLDL 38 03/07/2008 0525   LDLCALC 188 (H) 04/23/2023 0941   Hepatic Function Panel     Component Value Date/Time   PROT 6.2 10/29/2023 1154   ALBUMIN 4.1 10/29/2023 1154   AST 17 10/29/2023 1154   ALT 11 10/29/2023 1154   ALKPHOS 108 10/29/2023 1154    BILITOT 0.7 10/29/2023 1154   BILIDIR 0.6 (H) 03/09/2008 0500   IBILI 4.2 (H) 03/06/2008 1500      Component Value Date/Time   TSH 1.370 04/23/2023 0941   Nutritional Lab Results  Component Value Date   VD25OH 39.7 04/23/2023   VD25OH 28.0 05/15/2022   VD25OH 78.4 01/13/2022     Assessment and Plan Assessment & Plan Obesity She is actively working on weight loss, having lost six pounds in the last five weeks. She follows a category three eating plan 75% of the time and engages in chair exercises for 20 minutes, four times a week. She is motivated to continue losing weight. Zepbound  is prescribed to aid in weight loss, with challenges in insurance coverage and pharmacy issues discussed. The prescription is sent to [facility], which reliably stocks the medication and offers free mailing services. - Continue Zepbound  for weight loss - Send Zepbound  prescription to [facility] pharmacy - Encourage adherence to the eating plan and exercise regimen - Discuss insurance and pharmacy options for medication access  Obstructive Sleep Apnea Diagnosed with moderate obstructive sleep apnea. Emphasized the importance of CPAP use to prevent metabolic slowdown and aid in weight loss. She is hesitant but advised to try CPAP due to its potential benefits on metabolism and overall health. Provided tips for acclimating to CPAP use, such as wearing the mask while watching TV. - Encourage CPAP use - Provide tips for acclimating to CPAP use, such as wearing the mask while watching TV  Emotional Eating Being treated for emotional eating with fluoxetine  60 mg. Acknowledged her progress and encouraged continuation of the current treatment plan. - Continue fluoxetine  60 mg for emotional eating  Lower Extremity Edema Experiencing lower extremity edema, managed with Lasix  20 mg. - Continue Lasix  20 mg for lower extremity edema  Benign Essential Tremor Reports hand tremors, particularly  during tasks. Assessed as likely benign essential tremor, occurring during muscle use and not indicative of a severe condition. Advised to monitor the tremor and report if it worsens. Discussed potential gabapentin contribution to the tremor and recommended contacting her neurologist for further evaluation if needed - Monitor tremor symptoms - Advise contacting neurologist if tremor worsens - Discuss potential gabapentin contribution to tremor with Dr Onita      She was informed of the importance of frequent follow up visits to maximize her success with intensive lifestyle modifications for her multiple health conditions.    Louann Penton, MD

## 2024-05-06 ENCOUNTER — Other Ambulatory Visit (HOSPITAL_COMMUNITY): Payer: Self-pay

## 2024-05-10 ENCOUNTER — Other Ambulatory Visit (HOSPITAL_COMMUNITY): Payer: Self-pay

## 2024-05-12 ENCOUNTER — Other Ambulatory Visit (HOSPITAL_COMMUNITY): Payer: Self-pay

## 2024-05-15 ENCOUNTER — Other Ambulatory Visit (INDEPENDENT_AMBULATORY_CARE_PROVIDER_SITE_OTHER): Payer: Self-pay | Admitting: Family Medicine

## 2024-05-15 DIAGNOSIS — F418 Other specified anxiety disorders: Secondary | ICD-10-CM

## 2024-05-20 ENCOUNTER — Other Ambulatory Visit: Payer: Self-pay

## 2024-05-20 ENCOUNTER — Other Ambulatory Visit (HOSPITAL_COMMUNITY): Payer: Self-pay

## 2024-05-20 ENCOUNTER — Encounter: Payer: Self-pay | Admitting: Pharmacist

## 2024-05-23 ENCOUNTER — Other Ambulatory Visit: Payer: Self-pay

## 2024-05-24 LAB — HEPATIC FUNCTION PANEL
ALT: 9 U/L (ref 7–35)
AST: 14 (ref 13–35)
Alkaline Phosphatase: 85 (ref 25–125)
Bilirubin, Total: 0.8

## 2024-05-24 LAB — COMPREHENSIVE METABOLIC PANEL WITH GFR
Albumin: 3.8 (ref 3.5–5.0)
Calcium: 9.6 (ref 8.7–10.7)
eGFR: 29.8

## 2024-05-24 LAB — TSH: TSH: 1.16 (ref 0.41–5.90)

## 2024-05-24 LAB — LIPID PANEL
Cholesterol: 187 (ref 0–200)
HDL: 39 (ref 35–70)
LDL Cholesterol: 118
LDl/HDL Ratio: 3
Triglycerides: 149 (ref 40–160)

## 2024-05-24 LAB — BASIC METABOLIC PANEL WITH GFR
BUN: 38 — AB (ref 4–21)
CO2: 28 — AB (ref 13–22)
Chloride: 105 (ref 99–108)
Creatinine: 1.7 — AB (ref 0.5–1.1)
Glucose: 87
Potassium: 4.9 meq/L (ref 3.5–5.1)
Sodium: 141 (ref 137–147)

## 2024-05-24 LAB — HEMOGLOBIN A1C: Hemoglobin A1C: 5

## 2024-05-24 LAB — IRON,TIBC AND FERRITIN PANEL
Ferritin: 416
Iron: 136
TIBC: 258
UIBC: 122

## 2024-05-24 LAB — VITAMIN D 25 HYDROXY (VIT D DEFICIENCY, FRACTURES): Vit D, 25-Hydroxy: 29.5

## 2024-05-24 LAB — CBC AND DIFFERENTIAL
HCT: 39 (ref 36–46)
Hemoglobin: 12.8 (ref 12.0–16.0)
Platelets: 106 K/uL — AB (ref 150–400)
WBC: 5.8

## 2024-05-24 LAB — CBC: RBC: 4.1 (ref 3.87–5.11)

## 2024-05-31 ENCOUNTER — Other Ambulatory Visit (INDEPENDENT_AMBULATORY_CARE_PROVIDER_SITE_OTHER): Payer: Self-pay | Admitting: Family Medicine

## 2024-05-31 DIAGNOSIS — I10 Essential (primary) hypertension: Secondary | ICD-10-CM

## 2024-06-05 ENCOUNTER — Other Ambulatory Visit (INDEPENDENT_AMBULATORY_CARE_PROVIDER_SITE_OTHER): Payer: Self-pay | Admitting: Family Medicine

## 2024-06-05 DIAGNOSIS — R600 Localized edema: Secondary | ICD-10-CM

## 2024-06-05 DIAGNOSIS — E559 Vitamin D deficiency, unspecified: Secondary | ICD-10-CM

## 2024-06-05 DIAGNOSIS — F418 Other specified anxiety disorders: Secondary | ICD-10-CM

## 2024-06-05 DIAGNOSIS — I10 Essential (primary) hypertension: Secondary | ICD-10-CM

## 2024-06-06 ENCOUNTER — Other Ambulatory Visit (HOSPITAL_COMMUNITY): Payer: Self-pay

## 2024-06-06 ENCOUNTER — Other Ambulatory Visit (HOSPITAL_BASED_OUTPATIENT_CLINIC_OR_DEPARTMENT_OTHER): Payer: Self-pay

## 2024-06-06 MED ORDER — ZEPBOUND 7.5 MG/0.5ML ~~LOC~~ SOAJ
7.5000 mg | SUBCUTANEOUS | 5 refills | Status: DC
  Filled 2024-06-06 – 2024-06-07 (×2): qty 2, 28d supply, fill #0

## 2024-06-06 MED ORDER — ZEPBOUND 7.5 MG/0.5ML ~~LOC~~ SOAJ
7.5000 mg | SUBCUTANEOUS | 5 refills | Status: DC
  Filled 2024-06-30: qty 2, 28d supply, fill #0
  Filled 2024-07-28: qty 2, 28d supply, fill #1
  Filled 2024-08-28: qty 2, 28d supply, fill #2

## 2024-06-07 ENCOUNTER — Other Ambulatory Visit: Payer: Self-pay

## 2024-06-07 ENCOUNTER — Other Ambulatory Visit (HOSPITAL_BASED_OUTPATIENT_CLINIC_OR_DEPARTMENT_OTHER): Payer: Self-pay

## 2024-06-07 ENCOUNTER — Other Ambulatory Visit (HOSPITAL_COMMUNITY): Payer: Self-pay

## 2024-06-08 ENCOUNTER — Encounter (INDEPENDENT_AMBULATORY_CARE_PROVIDER_SITE_OTHER): Payer: Self-pay | Admitting: Family Medicine

## 2024-06-08 ENCOUNTER — Ambulatory Visit (INDEPENDENT_AMBULATORY_CARE_PROVIDER_SITE_OTHER): Admitting: Family Medicine

## 2024-06-08 VITALS — BP 111/67 | HR 75 | Temp 98.2°F | Ht 61.0 in | Wt 321.0 lb

## 2024-06-08 DIAGNOSIS — Z6841 Body Mass Index (BMI) 40.0 and over, adult: Secondary | ICD-10-CM

## 2024-06-08 DIAGNOSIS — E559 Vitamin D deficiency, unspecified: Secondary | ICD-10-CM | POA: Diagnosis not present

## 2024-06-08 DIAGNOSIS — I10 Essential (primary) hypertension: Secondary | ICD-10-CM

## 2024-06-08 DIAGNOSIS — E66813 Obesity, class 3: Secondary | ICD-10-CM

## 2024-06-08 DIAGNOSIS — I129 Hypertensive chronic kidney disease with stage 1 through stage 4 chronic kidney disease, or unspecified chronic kidney disease: Secondary | ICD-10-CM

## 2024-06-08 DIAGNOSIS — E669 Obesity, unspecified: Secondary | ICD-10-CM

## 2024-06-08 DIAGNOSIS — F5089 Other specified eating disorder: Secondary | ICD-10-CM

## 2024-06-08 DIAGNOSIS — N189 Chronic kidney disease, unspecified: Secondary | ICD-10-CM

## 2024-06-08 DIAGNOSIS — R600 Localized edema: Secondary | ICD-10-CM

## 2024-06-08 DIAGNOSIS — E785 Hyperlipidemia, unspecified: Secondary | ICD-10-CM

## 2024-06-08 DIAGNOSIS — E7849 Other hyperlipidemia: Secondary | ICD-10-CM

## 2024-06-08 DIAGNOSIS — F418 Other specified anxiety disorders: Secondary | ICD-10-CM

## 2024-06-08 DIAGNOSIS — F339 Major depressive disorder, recurrent, unspecified: Secondary | ICD-10-CM

## 2024-06-08 MED ORDER — FUROSEMIDE 20 MG PO TABS: ORAL_TABLET | ORAL | 0 refills | Status: DC

## 2024-06-08 MED ORDER — LISINOPRIL 10 MG PO TABS: 10.0000 mg | ORAL_TABLET | Freq: Every day | ORAL | 0 refills | Status: DC

## 2024-06-08 MED ORDER — VITAMIN D (ERGOCALCIFEROL) 1.25 MG (50000 UNIT) PO CAPS: ORAL_CAPSULE | ORAL | 0 refills | Status: DC

## 2024-06-08 MED ORDER — FLUOXETINE HCL 60 MG PO TABS: 1.0000 | ORAL_TABLET | Freq: Every day | ORAL | 0 refills | Status: DC

## 2024-06-08 NOTE — Progress Notes (Signed)
 Office: [phone]  /  Fax: [phone]  WEIGHT SUMMARY AND BIOMETRICS  Anthropometric Measurements Height: 5' 1 (1.549 m) Weight: (!) 321 lb (145.6 kg) BMI (Calculated): 60.68 Weight at Last Visit: 321 lb Weight Lost Since Last Visit: 6 lb Weight Gained Since Last Visit: 0 Starting Weight: 348 lb Total Weight Loss (lbs): 72 lb (32.7 kg) Peak Weight: 404 lb   Body Composition  Body Fat %: 65.9 % Fat Mass (lbs): 211.8 lbs Muscle Mass (lbs): 104 lbs Visceral Fat Rating : 31   Other Clinical Data Fasting: no Labs: no Today's Visit #: 18 Starting Date: 12/30/18    Chief Complaint: OBESITY   Discussed the use of AI scribe software for clinical note transcription with the patient, who gave verbal consent to proceed.  History of Present Illness Maria Stone is a 68 year old female who presents for a follow-up on her treatment progress for obesity.  She has been following a category three eating plan with approximately 75% adherence, resulting in a weight loss of six pounds over the past month. She is not participating in formal exercise but is making an effort to increase her mobility in daily activities.  Her cholesterol level is 189 mg/dL. Her A1c is 5.0. She has not experienced any issues with her medication, only feeling hungry when she forgets to eat.  Her current medications include furosemide  20 mg every other day, adjusted due to a creatinine level of 1.7. She is also on fluoxetine  60 mg, which she ran out of two weeks ago, and Crestor 5 mg taken three times a week. She has reduced her gabapentin intake to once in the morning and once at night due to experiencing shakes. Her blood pressure has been stable at 111/67.  Her vitamin D  level is 29.5 ng/mL, and she plans to refill her vitamin D  supplement. Her BUN was 38, and she plans to increase her water intake. She is using flavor drops to help with hydration.  Socially, she is trying to stay out of the  heat and uses flavor drops like Crystal Light and Mio to help with water intake.      PHYSICAL EXAM:  Blood pressure 111/67, pulse 75, temperature 98.2 F (36.8 C), height 5' 1 (1.549 m), weight (!) 321 lb (145.6 kg), SpO2 96%. Body mass index is 60.65 kg/m.  DIAGNOSTIC DATA REVIEWED:  BMET    Component Value Date/Time   NA 142 10/29/2023 1154   K 5.4 (H) 10/29/2023 1154   CL 103 10/29/2023 1154   CO2 25 10/29/2023 1154   GLUCOSE 91 10/29/2023 1154   GLUCOSE 93 01/01/2018 1312   BUN 41 (H) 10/29/2023 1154   CREATININE 1.25 (H) 10/29/2023 1154   CALCIUM 10.0 10/29/2023 1154   GFRNONAA 55 (L) 10/15/2020 0806   GFRAA 63 10/15/2020 0806   Lab Results  Component Value Date   HGBA1C 5.4 04/23/2023   HGBA1C (L) 03/08/2008    3.6 (NOTE)   The ADA recommends the following therapeutic goals for glycemic   control related to Hgb A1C measurement:   Goal of Therapy:   < 7.0% Hgb A1C   Action Suggested:  > 8.0% Hgb A1C   Ref:  Diabetes Care, 22, Suppl. 1, 1999   Lab Results  Component Value Date   INSULIN  10.6 04/23/2023   INSULIN  16.9 12/30/2018   Lab Results  Component Value Date   TSH 1.370 04/23/2023   CBC    Component Value Date/Time   WBC  4.8 05/15/2022 0000   WBC 6.2 01/01/2018 1215   RBC 4.2 05/15/2022 0000   HGB 14.1 05/15/2022 0000   HGB 14.2 01/13/2022 0729   HGB 15.2 12/12/2008 0804   HCT 42 05/15/2022 0000   HCT 44.1 01/13/2022 0729   HCT 43.8 12/12/2008 0804   PLT 143 (A) 05/15/2022 0000   PLT 155 01/13/2022 0729   MCV 97 01/13/2022 0729   MCV 90.8 12/12/2008 0804   MCH 31.2 01/13/2022 0729   MCH 31.5 01/01/2018 1215   MCHC 32.2 01/13/2022 0729   MCHC 32.0 01/01/2018 1215   RDW 11.7 01/13/2022 0729   RDW 13.6 12/12/2008 0804   Iron Studies    Component Value Date/Time   IRON 76 07/21/2008 0753   TIBC 295 07/21/2008 0753   FERRITIN 219 07/21/2008 0753   IRONPCTSAT 26 07/21/2008 0753   Lipid Panel     Component Value Date/Time   CHOL 274  (H) 04/23/2023 0941   TRIG 199 (H) 04/23/2023 0941   HDL 48 04/23/2023 0941   CHOLHDL 7.3 03/07/2008 0525   VLDL 38 03/07/2008 0525   LDLCALC 188 (H) 04/23/2023 0941   Hepatic Function Panel     Component Value Date/Time   PROT 6.2 10/29/2023 1154   ALBUMIN 4.1 10/29/2023 1154   AST 17 10/29/2023 1154   ALT 11 10/29/2023 1154   ALKPHOS 108 10/29/2023 1154   BILITOT 0.7 10/29/2023 1154   BILIDIR 0.6 (H) 03/09/2008 0500   IBILI 4.2 (H) 03/06/2008 1500      Component Value Date/Time   TSH 1.370 04/23/2023 0941   Nutritional Lab Results  Component Value Date   VD25OH 39.7 04/23/2023   VD25OH 28.0 05/15/2022   VD25OH 78.4 01/13/2022     Assessment and Plan Assessment & Plan Obesity Obesity management is ongoing with a focus on weight loss and dietary modifications. She has lost six pounds in the last month. Currently on a Category 3 eating plan, which she follows 75% of the time. Considering increasing Zepbound  from 5 mg to 7.5 mg due to possible increased hunger between meals. Discussed the importance of maintaining nutritional intake to avoid malnutrition and its consequences, such as hair loss and metabolic slowdown. Emphasized the importance of finding the 'sweet spot' for medication dosage to manage hunger without causing malnutrition. - Continue Category 3 eating plan - Increase Zepbound  to 7.5 mg and monitor for ability to meet nutritional guidelines - Monitor hunger levels and nutritional intake - Send message if experiencing queasiness or inability to eat  Chronic kidney disease Chronic kidney disease management includes monitoring kidney function. Creatinine level is 1.7, indicating possible dehydration. BUN is 38, suggesting dehydration. Furosemide  dosage reduced to 20 mg every other day to reduce strain on kidneys. Discussed the role of kidneys in filtering blood and the impact of furosemide  on kidney function. Emphasized the importance of hydration to prevent further  kidney function decline. - Reduce furosemide  to 20 mg every other day - Monitor kidney function at next visit - Encourage increased water intake to address dehydration  Hyperlipidemia Hyperlipidemia management is ongoing with improvements noted. Total cholesterol is 187, and LDL is 118. Apolipoprotein B is 90, which is at the upper limit of the desirable range. Current treatment includes Crestor 5 mg three times a week. Discussed the importance of maintaining cholesterol levels and the role of diet and medication in management. - Continue Crestor 5 mg on Monday, Wednesday, and Friday  Hypertension Hypertension is well-controlled with current medication regimen.  Blood pressure readings are excellent, with a recent reading of 111/67. Currently on lisinopril  at a low dose. Discussed the potential for future medication adjustments if blood pressure remains stable. 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 She has been out of fluoxetine  for two weeks. - Refill fluoxetine  60 mg for 90 days - Send fluoxetine  prescription to [facility]  B LE Edema Improving with weight loss Decrease furosemide  to every other day for now with the goal of stopping when no longer needed, in light of her renal function  Vitamin D  deficiency Vitamin D  deficiency is being addressed with supplementation. Current vitamin D  level is 29.5, which is below the desired level of 30. Aim to increase vitamin D  level to around 50 for optimal health. - Refill vitamin D  supplementation     She was informed of the importance of frequent follow up visits to maximize her success with intensive lifestyle modifications for her multiple health conditions.    Louann Penton, MD

## 2024-06-30 ENCOUNTER — Other Ambulatory Visit (HOSPITAL_COMMUNITY): Payer: Self-pay

## 2024-07-01 ENCOUNTER — Other Ambulatory Visit (HOSPITAL_COMMUNITY): Payer: Self-pay

## 2024-07-01 ENCOUNTER — Other Ambulatory Visit: Payer: Self-pay

## 2024-07-02 ENCOUNTER — Other Ambulatory Visit (HOSPITAL_COMMUNITY): Payer: Self-pay

## 2024-07-06 ENCOUNTER — Ambulatory Visit (INDEPENDENT_AMBULATORY_CARE_PROVIDER_SITE_OTHER): Admitting: Family Medicine

## 2024-07-06 ENCOUNTER — Other Ambulatory Visit (HOSPITAL_COMMUNITY): Payer: Self-pay

## 2024-07-06 ENCOUNTER — Encounter (INDEPENDENT_AMBULATORY_CARE_PROVIDER_SITE_OTHER): Payer: Self-pay | Admitting: Family Medicine

## 2024-07-06 VITALS — BP 104/69 | HR 77 | Temp 97.7°F | Ht 61.0 in | Wt 318.0 lb

## 2024-07-06 DIAGNOSIS — G8929 Other chronic pain: Secondary | ICD-10-CM

## 2024-07-06 DIAGNOSIS — F418 Other specified anxiety disorders: Secondary | ICD-10-CM

## 2024-07-06 DIAGNOSIS — E559 Vitamin D deficiency, unspecified: Secondary | ICD-10-CM | POA: Diagnosis not present

## 2024-07-06 DIAGNOSIS — M2559 Pain in other specified joint: Secondary | ICD-10-CM

## 2024-07-06 DIAGNOSIS — M170 Bilateral primary osteoarthritis of knee: Secondary | ICD-10-CM

## 2024-07-06 DIAGNOSIS — R600 Localized edema: Secondary | ICD-10-CM

## 2024-07-06 DIAGNOSIS — E669 Obesity, unspecified: Secondary | ICD-10-CM

## 2024-07-06 DIAGNOSIS — F5089 Other specified eating disorder: Secondary | ICD-10-CM

## 2024-07-06 DIAGNOSIS — G4733 Obstructive sleep apnea (adult) (pediatric): Secondary | ICD-10-CM | POA: Diagnosis not present

## 2024-07-06 DIAGNOSIS — E66813 Obesity, class 3: Secondary | ICD-10-CM

## 2024-07-06 DIAGNOSIS — N189 Chronic kidney disease, unspecified: Secondary | ICD-10-CM | POA: Diagnosis not present

## 2024-07-06 DIAGNOSIS — E860 Dehydration: Secondary | ICD-10-CM

## 2024-07-06 DIAGNOSIS — Z6841 Body Mass Index (BMI) 40.0 and over, adult: Secondary | ICD-10-CM

## 2024-07-06 MED ORDER — FUROSEMIDE 20 MG PO TABS: ORAL_TABLET | ORAL | 0 refills | Status: DC

## 2024-07-06 MED ORDER — FLUOXETINE HCL 60 MG PO TABS: 1.0000 | ORAL_TABLET | Freq: Every day | ORAL | 0 refills | Status: DC

## 2024-07-06 MED ORDER — ZEPBOUND 7.5 MG/0.5ML ~~LOC~~ SOAJ
7.5000 mg | SUBCUTANEOUS | 5 refills | Status: DC
  Filled 2024-07-06 – 2024-08-28 (×3): qty 2, 28d supply, fill #0

## 2024-07-06 NOTE — Progress Notes (Signed)
 Office: [phone]  /  Fax: [phone]  WEIGHT SUMMARY AND BIOMETRICS  Anthropometric Measurements Height: 5' 1 (1.549 m) Weight: (!) 318 lb (144.2 kg) BMI (Calculated): 60.12 Weight at Last Visit: 321 lb Weight Lost Since Last Visit: 3 lb Weight Gained Since Last Visit: 0 Starting Weight: 348 lb Total Weight Loss (lbs): 75 lb (34 kg) Peak Weight: 404 lb   Body Composition  Body Fat %: 65.3 % Fat Mass (lbs): 208 lbs Muscle Mass (lbs): 105 lbs Visceral Fat Rating : 31   Other Clinical Data Fasting: yes Labs: no Today's Visit #: 65 Starting Date: 12/30/18    Chief Complaint: OBESITY   History of Present Illness Maria Stone is a 68 year old female with obesity and obstructive sleep apnea who presents for obesity treatment and progress assessment.  She is adhering to the category three plan 75% of the time and has increased her physical activity, including more walking. She reports pain in multiple weight-bearing joints. She has lost three pounds in the last month. She is currently on Zepbound  and is working on following a diet and exercise plan, along with behavior modification techniques. She requests a refill for Zepbound .  She has a history of obstructive sleep apnea, which is being treated alongside her obesity. Details of her current treatment regimen for sleep apnea were not discussed.  She has a history of lower extremity edema and is on Lasix  620 mg daily, for which she requests a refill. She experiences swelling in her knees, ankles, and legs, and has difficulty using compression garments due to the size of her legs.  She has a history of depression with emotional eating behaviors and is stable on fluoxetine  60 mg, for which she requests a refill.  She is concerned about her elevated ferritin levels, as her sister was recently diagnosed with hemochromatosis. Her sister advised her to have it checked due to the potential familial risk.  She  discusses her hydration status, noting a tendency to drink soda instead of water, which may contribute to dehydration. She is trying to switch to sparkling water to improve her hydration.  She mentions taking a supplement designed for those on GLP-1 medications to prevent muscle loss, which contains iron and other nutrients. She has been using it for two weeks and is concerned about its impact on her health.      PHYSICAL EXAM:  Blood pressure 104/69, pulse 77, temperature 97.7 F (36.5 C), height 5' 1 (1.549 m), weight (!) 318 lb (144.2 kg), SpO2 95%. Body mass index is 60.09 kg/m.  DIAGNOSTIC DATA REVIEWED:  BMET    Component Value Date/Time   NA 142 10/29/2023 1154   K 5.4 (H) 10/29/2023 1154   CL 103 10/29/2023 1154   CO2 25 10/29/2023 1154   GLUCOSE 91 10/29/2023 1154   GLUCOSE 93 01/01/2018 1312   BUN 41 (H) 10/29/2023 1154   CREATININE 1.25 (H) 10/29/2023 1154   CALCIUM 10.0 10/29/2023 1154   GFRNONAA 55 (L) 10/15/2020 0806   GFRAA 63 10/15/2020 0806   Lab Results  Component Value Date   HGBA1C 5.4 04/23/2023   HGBA1C (L) 03/08/2008    3.6 (NOTE)   The ADA recommends the following therapeutic goals for glycemic   control related to Hgb A1C measurement:   Goal of Therapy:   < 7.0% Hgb A1C   Action Suggested:  > 8.0% Hgb A1C   Ref:  Diabetes Care, 22, Suppl. 1, 1999   Lab Results  Component Value Date   INSULIN  10.6 04/23/2023   INSULIN  16.9 12/30/2018   Lab Results  Component Value Date   TSH 1.370 04/23/2023   CBC    Component Value Date/Time   WBC 4.8 05/15/2022 0000   WBC 6.2 01/01/2018 1215   RBC 4.2 05/15/2022 0000   HGB 14.1 05/15/2022 0000   HGB 14.2 01/13/2022 0729   HGB 15.2 12/12/2008 0804   HCT 42 05/15/2022 0000   HCT 44.1 01/13/2022 0729   HCT 43.8 12/12/2008 0804   PLT 143 (A) 05/15/2022 0000   PLT 155 01/13/2022 0729   MCV 97 01/13/2022 0729   MCV 90.8 12/12/2008 0804   MCH 31.2 01/13/2022 0729   MCH 31.5 01/01/2018 1215   MCHC  32.2 01/13/2022 0729   MCHC 32.0 01/01/2018 1215   RDW 11.7 01/13/2022 0729   RDW 13.6 12/12/2008 0804   Iron Studies    Component Value Date/Time   IRON 76 07/21/2008 0753   TIBC 295 07/21/2008 0753   FERRITIN 219 07/21/2008 0753   IRONPCTSAT 26 07/21/2008 0753   Lipid Panel     Component Value Date/Time   CHOL 274 (H) 04/23/2023 0941   TRIG 199 (H) 04/23/2023 0941   HDL 48 04/23/2023 0941   CHOLHDL 7.3 03/07/2008 0525   VLDL 38 03/07/2008 0525   LDLCALC 188 (H) 04/23/2023 0941   Hepatic Function Panel     Component Value Date/Time   PROT 6.2 10/29/2023 1154   ALBUMIN 4.1 10/29/2023 1154   AST 17 10/29/2023 1154   ALT 11 10/29/2023 1154   ALKPHOS 108 10/29/2023 1154   BILITOT 0.7 10/29/2023 1154   BILIDIR 0.6 (H) 03/09/2008 0500   IBILI 4.2 (H) 03/06/2008 1500      Component Value Date/Time   TSH 1.370 04/23/2023 0941   Nutritional Lab Results  Component Value Date   VD25OH 39.7 04/23/2023   VD25OH 28.0 05/15/2022   VD25OH 78.4 01/13/2022     Assessment and Plan Assessment & Plan Obesity Obesity management with a category three plan, adherence at 75%. Weight loss of three pounds in the last month. Discussion on the effects of Zepbound , including hunger management and the importance of maintaining appropriate nutrition. Emphasis on the importance of hydration and the impact of weight loss on joint pressure and pain. Discussion on the potential need to adjust Zepbound  dosage based on hunger control and nutritional intake. - Continue category three plan for obesity management - Refill Zepbound  at 7.5 mg - Monitor weight and adjust Zepbound  dosage if necessary - Encourage hydration and appropriate nutrition - Discuss potential use of compression garments for joint support  Obstructive sleep apnea Obstructive sleep apnea management with Zepbound . - Continue Zepbound  for obstructive sleep apnea - Continue diet, exercise and weight loss as discussed today as an  important part of the treatment plan  Chronic lower extremity edema Chronic lower extremity edema managed with Lasix  620 mg daily. Concerns about kidney function and dehydration affecting creatinine levels. Discussion on the potential need to discontinue Lasix  if kidney function does not improve. Consideration of compression garments for edema management. - Refill Lasix  20 mg but take every other day - Monitor kidney function and hydration status. Labs done today - Consider discontinuing Lasix  if kidney function does not improve - Discuss use of compression garments for edema management  Chronic kidney disease Elevated creatinine level at 1.7 and BUN at 38 indicating dehydration as well. Discussion on the impact of Lasix  on kidney function and  the importance of hydration. Consideration of discontinuing Lasix  if kidney function does not improve. - Monitor kidney function with repeat labs - Encourage increased water intake - Consider discontinuing Lasix  if kidney function does not improve  Emotional eating behaviors Mood and EEB managed with fluoxetine  60 mg. No new symptoms reported. - Refill fluoxetine  60 mg  Vitamin D  deficiency Vitamin D  level at 29, below the desired level of 50. Discussion on the importance of maintaining adequate vitamin D  levels. - Increase vitamin D  intake to reach desired level of 50  Chronic pain in multiple weight-bearing joints Chronic pain in multiple weight-bearing joints, exacerbated by obesity. Discussion on the benefits of weight loss in reducing joint pressure and pain. Emphasis on the impact of weight loss on joint pressure, with each pound lost reducing knee pressure by four pounds. - Encourage weight loss to reduce joint pressure and pain - Consider use of compression garments for joint support     I have personally spent 43 minutes total time today in preparation, patient care, and documentation for this visit, including the following: review of  clinical lab tests; review of medical history, nu   She was informed of the importance of frequent follow up visits to maximize her success with intensive lifestyle modifications for her multiple health conditions.    Louann Penton, MD

## 2024-07-07 ENCOUNTER — Other Ambulatory Visit (INDEPENDENT_AMBULATORY_CARE_PROVIDER_SITE_OTHER): Payer: Self-pay

## 2024-07-07 ENCOUNTER — Ambulatory Visit (INDEPENDENT_AMBULATORY_CARE_PROVIDER_SITE_OTHER): Payer: Self-pay | Admitting: Family Medicine

## 2024-07-07 LAB — CMP14+EGFR
ALT: 10 IU/L (ref 0–32)
AST: 17 IU/L (ref 0–40)
Albumin: 4.1 g/dL (ref 3.9–4.9)
Alkaline Phosphatase: 111 IU/L (ref 44–121)
BUN/Creatinine Ratio: 17 (ref 12–28)
BUN: 25 mg/dL (ref 8–27)
Bilirubin Total: 0.8 mg/dL (ref 0.0–1.2)
CO2: 24 mmol/L (ref 20–29)
Calcium: 10.1 mg/dL (ref 8.7–10.3)
Chloride: 101 mmol/L (ref 96–106)
Creatinine, Ser: 1.43 mg/dL — ABNORMAL HIGH (ref 0.57–1.00)
Globulin, Total: 2.2 g/dL (ref 1.5–4.5)
Glucose: 95 mg/dL (ref 70–99)
Potassium: 5.1 mmol/L (ref 3.5–5.2)
Sodium: 141 mmol/L (ref 134–144)
Total Protein: 6.3 g/dL (ref 6.0–8.5)
eGFR: 40 mL/min/1.73 — ABNORMAL LOW (ref >59)

## 2024-07-15 ENCOUNTER — Encounter: Payer: Self-pay | Admitting: Internal Medicine

## 2024-07-28 ENCOUNTER — Other Ambulatory Visit (HOSPITAL_COMMUNITY): Payer: Self-pay

## 2024-08-03 ENCOUNTER — Encounter (INDEPENDENT_AMBULATORY_CARE_PROVIDER_SITE_OTHER): Payer: Self-pay | Admitting: Family Medicine

## 2024-08-03 ENCOUNTER — Ambulatory Visit (INDEPENDENT_AMBULATORY_CARE_PROVIDER_SITE_OTHER): Admitting: Family Medicine

## 2024-08-03 VITALS — BP 99/64 | HR 77 | Temp 98.0°F | Ht 61.0 in | Wt 319.0 lb

## 2024-08-03 DIAGNOSIS — Z6841 Body Mass Index (BMI) 40.0 and over, adult: Secondary | ICD-10-CM | POA: Diagnosis not present

## 2024-08-03 DIAGNOSIS — G4733 Obstructive sleep apnea (adult) (pediatric): Secondary | ICD-10-CM | POA: Diagnosis not present

## 2024-08-03 DIAGNOSIS — E669 Obesity, unspecified: Secondary | ICD-10-CM | POA: Diagnosis not present

## 2024-08-03 DIAGNOSIS — Z636 Dependent relative needing care at home: Secondary | ICD-10-CM

## 2024-08-03 DIAGNOSIS — E66813 Obesity, class 3: Secondary | ICD-10-CM

## 2024-08-03 NOTE — Progress Notes (Signed)
 Office: [phone]  /  Fax: [phone]  WEIGHT SUMMARY AND BIOMETRICS  Anthropometric Measurements Height: 5' 1 (1.549 m) Weight: (!) 319 lb (144.7 kg) BMI (Calculated): 60.31 Weight at Last Visit: 318 lb Weight Lost Since Last Visit: 0 Weight Gained Since Last Visit: 1 lb Starting Weight: 348 lb Total Weight Loss (lbs): 74 lb (33.6 kg) Peak Weight: 404 lb   Body Composition  Body Fat %: 66 % Fat Mass (lbs): 210.6 lbs Muscle Mass (lbs): 103 lbs Visceral Fat Rating : 31   Other Clinical Data Fasting: yes Labs: no Today's Visit #: 69 Starting Date: 12/30/18    Chief Complaint: OBESITY    History of Present Illness Maria Stone is a 68 year old female with obesity and obstructive sleep apnea who presents for obesity treatment and progress assessment.  She has been following the category three eating plan 75% of the time, focusing on increasing her intake of fruits and vegetables, but struggles to meet the recommended amount of lean protein. She occasionally skips meals and does not consistently achieve seven to nine hours of sleep per night. Although she is not engaging in formal exercise, she is attempting to be more active, which is challenging due to chronic pain issues. She has gained one pound in the last month since her last visit.  She is currently taking Zepbound  7.5 mg, which she has tolerated well. This medication is primarily prescribed to aid in weight loss to improve her obstructive sleep apnea symptoms. She maintains good hydration but occasionally skips meals due to scheduling conflicts, particularly related to caring for her mother.  She reported that her responsibilities as a caregiver for her mother, who recently experienced a concerning episode at night, have impacted her own schedule and health behaviors. She is actively involved in her mother's care, which affects her own daily routine and health behaviors.  Her sister, Arland, has several  chronic health conditions, which adds to the patient's caregiving responsibilities and stress. She is concerned about balancing her responsibilities between her own health, her mother's care, and supporting her sister.      PHYSICAL EXAM:  Blood pressure 99/64, pulse 77, temperature 98 F (36.7 C), height 5' 1 (1.549 m), weight (!) 319 lb (144.7 kg), SpO2 96%. Body mass index is 60.27 kg/m.  DIAGNOSTIC DATA REVIEWED:  BMET    Component Value Date/Time   NA 141 07/06/2024 0846   K 5.1 07/06/2024 0846   CL 101 07/06/2024 0846   CO2 24 07/06/2024 0846   GLUCOSE 95 07/06/2024 0846   GLUCOSE 93 01/01/2018 1312   BUN 25 07/06/2024 0846   CREATININE 1.43 (H) 07/06/2024 0846   CALCIUM 10.1 07/06/2024 0846   GFRNONAA 55 (L) 10/15/2020 0806   GFRAA 63 10/15/2020 0806   Lab Results  Component Value Date   HGBA1C 5.0 05/24/2024   HGBA1C (L) 03/08/2008    3.6 (NOTE)   The ADA recommends the following therapeutic goals for glycemic   control related to Hgb A1C measurement:   Goal of Therapy:   < 7.0% Hgb A1C   Action Suggested:  > 8.0% Hgb A1C   Ref:  Diabetes Care, 22, Suppl. 1, 1999   Lab Results  Component Value Date   INSULIN  10.6 04/23/2023   INSULIN  16.9 12/30/2018   Lab Results  Component Value Date   TSH 1.16 05/24/2024   CBC    Component Value Date/Time   WBC 5.8 05/24/2024 0000   WBC 6.2 01/01/2018 1215  RBC 4.1 05/24/2024 0000   HGB 12.8 05/24/2024 0000   HGB 14.2 01/13/2022 0729   HGB 15.2 12/12/2008 0804   HCT 39 05/24/2024 0000   HCT 44.1 01/13/2022 0729   HCT 43.8 12/12/2008 0804   PLT 106 (A) 05/24/2024 0000   PLT 155 01/13/2022 0729   MCV 97 01/13/2022 0729   MCV 90.8 12/12/2008 0804   MCH 31.2 01/13/2022 0729   MCH 31.5 01/01/2018 1215   MCHC 32.2 01/13/2022 0729   MCHC 32.0 01/01/2018 1215   RDW 11.7 01/13/2022 0729   RDW 13.6 12/12/2008 0804   Iron Studies    Component Value Date/Time   IRON 136 05/24/2024 0000   TIBC 258 05/24/2024 0000    FERRITIN 416 05/24/2024 0000   IRONPCTSAT 26 07/21/2008 0753   Lipid Panel     Component Value Date/Time   CHOL 187 05/24/2024 0000   CHOL 274 (H) 04/23/2023 0941   TRIG 149 05/24/2024 0000   HDL 39 05/24/2024 0000   HDL 48 04/23/2023 0941   CHOLHDL 7.3 03/07/2008 0525   VLDL 38 03/07/2008 0525   LDLCALC 118 05/24/2024 0000   LDLCALC 188 (H) 04/23/2023 0941   Hepatic Function Panel     Component Value Date/Time   PROT 6.3 07/06/2024 0846   ALBUMIN 4.1 07/06/2024 0846   AST 17 07/06/2024 0846   ALT 10 07/06/2024 0846   ALKPHOS 111 07/06/2024 0846   BILITOT 0.8 07/06/2024 0846   BILIDIR 0.6 (H) 03/09/2008 0500   IBILI 4.2 (H) 03/06/2008 1500      Component Value Date/Time   TSH 1.16 05/24/2024 0000   TSH 1.370 04/23/2023 0941   Nutritional Lab Results  Component Value Date   VD25OH 29.5 05/24/2024   VD25OH 39.7 04/23/2023   VD25OH 28.0 05/15/2022     Assessment and Plan Assessment & Plan Obesity Obesity management with a category three eating plan, adherence at 75%. Struggling with recommended intake of lean protein and skipping meals. No formal exercise due to chronic pain, but attempting to increase activity. Gained one pound in the last month. Zepbound  7.5 mg is well tolerated and prescribed primarily for weight loss to improve obstructive sleep apnea symptoms. - Continue Zepbound  7.5 mg - Encourage adherence to eating plan, focusing on increasing lean protein intake - Advise against skipping meals; consider protein bars as a supplement - Encourage increased physical activity as tolerated  Obstructive sleep apnea Obstructive sleep apnea managed with Zepbound , primarily through weight loss. CPAP usage is not currently in place due to technical issues with equipment procurement and order processing. - Contact CPAP provider to resolve equipment procurement issues - Continue monitoring weight loss as a primary method to alleviate symptoms -Will continue to  manage her Zepbound  prescription   Caregiver burden/Fatigue She was offered support and encouragement to help her care for her mother and other family members. Importance of self care emphasized.  Sundowning symptoms discussed. Will continue to monitor and watch for signs she is overwhelmed.       Edye was informed of the importance of frequent follow up visits to maximize her success with intensive lifestyle modifications for her obesity and obesity related health conditions as recommended by USPSTF and CMS guidelines   Louann Penton, MD

## 2024-08-05 ENCOUNTER — Other Ambulatory Visit (INDEPENDENT_AMBULATORY_CARE_PROVIDER_SITE_OTHER): Payer: Self-pay | Admitting: Family Medicine

## 2024-08-05 DIAGNOSIS — R600 Localized edema: Secondary | ICD-10-CM

## 2024-08-28 ENCOUNTER — Other Ambulatory Visit (HOSPITAL_COMMUNITY): Payer: Self-pay

## 2024-08-29 ENCOUNTER — Other Ambulatory Visit: Payer: Self-pay

## 2024-08-31 ENCOUNTER — Other Ambulatory Visit (HOSPITAL_COMMUNITY): Payer: Self-pay

## 2024-08-31 ENCOUNTER — Encounter (INDEPENDENT_AMBULATORY_CARE_PROVIDER_SITE_OTHER): Payer: Self-pay | Admitting: Family Medicine

## 2024-08-31 ENCOUNTER — Ambulatory Visit (INDEPENDENT_AMBULATORY_CARE_PROVIDER_SITE_OTHER): Admitting: Family Medicine

## 2024-08-31 VITALS — BP 101/62 | HR 65 | Temp 97.7°F | Ht 61.0 in | Wt 315.0 lb

## 2024-08-31 DIAGNOSIS — I10 Essential (primary) hypertension: Secondary | ICD-10-CM

## 2024-08-31 DIAGNOSIS — E66813 Obesity, class 3: Secondary | ICD-10-CM

## 2024-08-31 DIAGNOSIS — E559 Vitamin D deficiency, unspecified: Secondary | ICD-10-CM

## 2024-08-31 DIAGNOSIS — G4733 Obstructive sleep apnea (adult) (pediatric): Secondary | ICD-10-CM | POA: Diagnosis not present

## 2024-08-31 DIAGNOSIS — F5089 Other specified eating disorder: Secondary | ICD-10-CM | POA: Diagnosis not present

## 2024-08-31 DIAGNOSIS — F418 Other specified anxiety disorders: Secondary | ICD-10-CM

## 2024-08-31 DIAGNOSIS — Z6841 Body Mass Index (BMI) 40.0 and over, adult: Secondary | ICD-10-CM

## 2024-08-31 DIAGNOSIS — R600 Localized edema: Secondary | ICD-10-CM

## 2024-08-31 MED ORDER — FUROSEMIDE 20 MG PO TABS: ORAL_TABLET | ORAL | 0 refills | Status: DC

## 2024-08-31 MED ORDER — FLUOXETINE HCL 60 MG PO TABS: 1.0000 | ORAL_TABLET | Freq: Every day | ORAL | 0 refills | Status: DC

## 2024-08-31 MED ORDER — LISINOPRIL 5 MG PO TABS: 5.0000 mg | ORAL_TABLET | Freq: Every day | ORAL | 3 refills | Status: DC

## 2024-08-31 MED ORDER — VITAMIN D (ERGOCALCIFEROL) 1.25 MG (50000 UNIT) PO CAPS: ORAL_CAPSULE | ORAL | 0 refills | Status: DC

## 2024-08-31 MED ORDER — ZEPBOUND 7.5 MG/0.5ML ~~LOC~~ SOAJ
7.5000 mg | SUBCUTANEOUS | 0 refills | Status: DC
  Filled 2024-08-31 – 2024-09-15 (×2): qty 6, 84d supply, fill #0
  Filled 2024-09-21: qty 2, 28d supply, fill #0

## 2024-08-31 NOTE — Addendum Note (Signed)
 Addended by: LAFE BAKER CROME on: 08/31/2024 10:23 AM   Modules accepted: Level of Service

## 2024-08-31 NOTE — Progress Notes (Signed)
 Office: [phone]  /  Fax: [phone]  WEIGHT SUMMARY AND BIOMETRICS  Anthropometric Measurements Height: 5' 1 (1.549 m) Weight: (!) 315 lb (142.9 kg) BMI (Calculated): 59.55 Weight at Last Visit: 319 lb Weight Lost Since Last Visit: 4 lb Weight Gained Since Last Visit: 0 Starting Weight: 393 lb Total Weight Loss (lbs): 78 lb (35.4 kg) Peak Weight: 404 lb   Body Composition  Body Fat %: 66.7 % Fat Mass (lbs): 210.4 lbs Muscle Mass (lbs): 99.6 lbs Visceral Fat Rating : 31   Other Clinical Data Fasting: yes Labs: no Today's Visit #: 70 Starting Date: 12/30/18    Chief Complaint: OBESITY    History of Present Illness Maria Stone is a 68 year old female with obesity and obstructive sleep apnea who presents for obesity treatment and progress assessment.  She is following the category three obesity treatment plan approximately 75% of the time. She is working on increasing her intake of fruits and vegetables but struggles to meet the recommended protein intake. She is adequately hydrating but often skips meals and does not consistently get seven or more hours of sleep. She has lost four pounds in the last month, and her BMI has decreased from 65 to 59. She is motivated to continue losing weight to reach her goal of getting below 300 pounds.  She is being treated for obstructive sleep apnea with a CPAP machine and Zepbound , currently at a dose of 7.5 mg. She has been on this dose for almost two months. She missed a dose recently and was two days late taking her last shot, but did not experience any adverse effects such as nausea or vomiting. Her sleep is disrupted due to her sister's medical alert system and her mother's nighttime activities.  She has a history of hypertension and is on lisinopril  10 mg daily. She is also on Lasix . She is on fluoxetine  and vitamin D  supplements.  Her hunger levels are manageable and she is not experiencing significant hunger.  Initially, she experienced nausea and diarrhea, which she attributes to stress, but these symptoms have resolved. She is trying to increase her protein intake with protein bars.      PHYSICAL EXAM:  Blood pressure 101/62, pulse 65, temperature 97.7 F (36.5 C), height 5' 1 (1.549 m), weight (!) 315 lb (142.9 kg), SpO2 96%. Body mass index is 59.52 kg/m.  DIAGNOSTIC DATA REVIEWED:  BMET    Component Value Date/Time   NA 141 07/06/2024 0846   K 5.1 07/06/2024 0846   CL 101 07/06/2024 0846   CO2 24 07/06/2024 0846   GLUCOSE 95 07/06/2024 0846   GLUCOSE 93 01/01/2018 1312   BUN 25 07/06/2024 0846   CREATININE 1.43 (H) 07/06/2024 0846   CALCIUM 10.1 07/06/2024 0846   GFRNONAA 55 (L) 10/15/2020 0806   GFRAA 63 10/15/2020 0806   Lab Results  Component Value Date   HGBA1C 5.0 05/24/2024   HGBA1C (L) 03/08/2008    3.6 (NOTE)   The ADA recommends the following therapeutic goals for glycemic   control related to Hgb A1C measurement:   Goal of Therapy:   < 7.0% Hgb A1C   Action Suggested:  > 8.0% Hgb A1C   Ref:  Diabetes Care, 22, Suppl. 1, 1999   Lab Results  Component Value Date   INSULIN  10.6 04/23/2023   INSULIN  16.9 12/30/2018   Lab Results  Component Value Date   TSH 1.16 05/24/2024   CBC    Component Value Date/Time  WBC 5.8 05/24/2024 0000   WBC 6.2 01/01/2018 1215   RBC 4.1 05/24/2024 0000   HGB 12.8 05/24/2024 0000   HGB 14.2 01/13/2022 0729   HGB 15.2 12/12/2008 0804   HCT 39 05/24/2024 0000   HCT 44.1 01/13/2022 0729   HCT 43.8 12/12/2008 0804   PLT 106 (A) 05/24/2024 0000   PLT 155 01/13/2022 0729   MCV 97 01/13/2022 0729   MCV 90.8 12/12/2008 0804   MCH 31.2 01/13/2022 0729   MCH 31.5 01/01/2018 1215   MCHC 32.2 01/13/2022 0729   MCHC 32.0 01/01/2018 1215   RDW 11.7 01/13/2022 0729   RDW 13.6 12/12/2008 0804   Iron Studies    Component Value Date/Time   IRON 136 05/24/2024 0000   TIBC 258 05/24/2024 0000   FERRITIN 416 05/24/2024 0000    IRONPCTSAT 26 07/21/2008 0753   Lipid Panel     Component Value Date/Time   CHOL 187 05/24/2024 0000   CHOL 274 (H) 04/23/2023 0941   TRIG 149 05/24/2024 0000   HDL 39 05/24/2024 0000   HDL 48 04/23/2023 0941   CHOLHDL 7.3 03/07/2008 0525   VLDL 38 03/07/2008 0525   LDLCALC 118 05/24/2024 0000   LDLCALC 188 (H) 04/23/2023 0941   Hepatic Function Panel     Component Value Date/Time   PROT 6.3 07/06/2024 0846   ALBUMIN 4.1 07/06/2024 0846   AST 17 07/06/2024 0846   ALT 10 07/06/2024 0846   ALKPHOS 111 07/06/2024 0846   BILITOT 0.8 07/06/2024 0846   BILIDIR 0.6 (H) 03/09/2008 0500   IBILI 4.2 (H) 03/06/2008 1500      Component Value Date/Time   TSH 1.16 05/24/2024 0000   TSH 1.370 04/23/2023 0941   Nutritional Lab Results  Component Value Date   VD25OH 29.5 05/24/2024   VD25OH 39.7 04/23/2023   VD25OH 28.0 05/15/2022     Assessment and Plan Assessment & Plan Obesity and Emotional Eating Behaviors She is on a category three obesity treatment plan, adhering approximately 75% of the time, resulting in a four-pound weight loss in the last month. She is increasing fruit and vegetable intake but struggles to meet the recommended 100 grams of protein daily. She is adequately hydrating but skips meals and lacks consistent sleep. Her goal is to reduce weight to below 300 pounds before the holidays. She has been on Zepbound  7.5 mg for almost two months, missed a dose by two days without adverse effects, and was advised on dosing schedule adjustments to prevent nausea and ensure adequate protein intake to maintain metabolism and prevent muscle loss. - Continue category three plan with a focus on increasing protein intake to at least 100 grams per day. - Ensure daily caloric intake is between 1200 to 1500 calories to prevent metabolic slowdown. - Incorporate more protein-rich foods such as chicken, dairy, and protein bars into the diet. (Behavior modification) - Consider using  protein drinks made with real food ingredients to meet protein goals. - Avoid skipping meals and aim for at least seven hours of sleep per night. - Continue Zepbound  7.5 mg weekly, adjust dosing schedule if a dose is missed by two or more days to avoid nausea. - Refill prozac  and continue to monitor closely.  Obstructive Sleep Apnea She uses a CPAP machine for treatment. External sleep disruptions due to her sister's and mother's needs may affect her sleep quality and contribute to weight management challenges. - Continue CPAP therapy. -Continue Zepbound  for OSA - Continue nutritional intake  as described above  Essential hypertension Hypertension is well-controlled with lisinopril  10 mg daily. Blood pressure readings today were 79/54 and 101/62. She is also on Lasix , which may contribute to lower blood pressure readings. Monitoring is important to prevent hypotension. - Change lisinopril  to 5 mg once daily. - Adjust lisinopril  dosage if blood pressure remains low. - Continue diet, exercise and weight loss as discussed today as an important part of the treatment plan\  Vit D deficiency Stable on vit D Rx -Refill vit D and continue to follow.      Hortense was informed of the importance of frequent follow up visits to maximize her success with intensive lifestyle modifications for her obesity and obesity related health conditions as recommended by USPSTF and CMS guidelines   Louann Penton, MD

## 2024-09-15 ENCOUNTER — Other Ambulatory Visit (HOSPITAL_COMMUNITY): Payer: Self-pay

## 2024-09-15 ENCOUNTER — Other Ambulatory Visit: Payer: Self-pay

## 2024-09-21 ENCOUNTER — Other Ambulatory Visit (HOSPITAL_COMMUNITY): Payer: Self-pay

## 2024-09-21 ENCOUNTER — Other Ambulatory Visit: Payer: Self-pay

## 2024-09-22 ENCOUNTER — Other Ambulatory Visit (HOSPITAL_COMMUNITY): Payer: Self-pay

## 2024-09-28 ENCOUNTER — Ambulatory Visit (INDEPENDENT_AMBULATORY_CARE_PROVIDER_SITE_OTHER): Payer: Self-pay | Admitting: Family Medicine

## 2024-09-28 ENCOUNTER — Encounter (INDEPENDENT_AMBULATORY_CARE_PROVIDER_SITE_OTHER): Payer: Self-pay | Admitting: Family Medicine

## 2024-09-28 VITALS — BP 123/64 | HR 69 | Temp 98.4°F | Ht 61.0 in | Wt 322.0 lb

## 2024-09-28 DIAGNOSIS — G4733 Obstructive sleep apnea (adult) (pediatric): Secondary | ICD-10-CM

## 2024-09-28 DIAGNOSIS — E559 Vitamin D deficiency, unspecified: Secondary | ICD-10-CM

## 2024-09-28 DIAGNOSIS — Z6841 Body Mass Index (BMI) 40.0 and over, adult: Secondary | ICD-10-CM

## 2024-09-28 DIAGNOSIS — F5089 Other specified eating disorder: Secondary | ICD-10-CM

## 2024-09-28 DIAGNOSIS — R600 Localized edema: Secondary | ICD-10-CM | POA: Diagnosis not present

## 2024-09-28 DIAGNOSIS — F418 Other specified anxiety disorders: Secondary | ICD-10-CM

## 2024-09-28 DIAGNOSIS — E669 Obesity, unspecified: Secondary | ICD-10-CM

## 2024-09-28 DIAGNOSIS — F3289 Other specified depressive episodes: Secondary | ICD-10-CM | POA: Diagnosis not present

## 2024-09-28 MED ORDER — VITAMIN D (ERGOCALCIFEROL) 1.25 MG (50000 UNIT) PO CAPS: ORAL_CAPSULE | ORAL | 0 refills | Status: DC

## 2024-09-28 MED ORDER — ZEPBOUND 7.5 MG/0.5ML ~~LOC~~ SOAJ: 7.5000 mg | SUBCUTANEOUS | 0 refills | Status: DC

## 2024-09-28 MED ORDER — FLUOXETINE HCL 60 MG PO TABS: 1.0000 | ORAL_TABLET | Freq: Every day | ORAL | 0 refills | Status: DC

## 2024-09-28 MED ORDER — FUROSEMIDE 20 MG PO TABS: ORAL_TABLET | ORAL | 0 refills | Status: DC

## 2024-09-28 NOTE — Progress Notes (Signed)
 Office: [phone]  /  Fax: [phone]  WEIGHT SUMMARY AND BIOMETRICS  Anthropometric Measurements Height: 5' 1 (1.549 m) Weight: (!) 322 lb (146.1 kg) BMI (Calculated): 60.87 Weight at Last Visit: 315 lb Weight Lost Since Last Visit: 0 Weight Gained Since Last Visit: 7 lb Starting Weight: 393 lb Total Weight Loss (lbs): 71 lb (32.2 kg) Peak Weight: 404 lb   Body Composition  Body Fat %: 68.5 % Fat Mass (lbs): 220.6 lbs Muscle Mass (lbs): 96.2 lbs Visceral Fat Rating : 32   Other Clinical Data Fasting: yes Labs: no Today's Visit #: 11 Starting Date: 12/30/18    Chief Complaint: OBESITY    History of Present Illness Maria Stone is a 68 year old female who presents for obesity treatment and progress assessment.  She has been following the category three eating plan approximately 75% of the time. Despite her efforts, she has gained seven pounds in the last month but remains down 71 pounds from her starting weight. She is not currently engaging in exercise but is attempting to be more active overall. She is concerned about the recent weight gain and questions whether it might be due to fluid retention, although this has not been confirmed.  She is being treated for vitamin D  deficiency with prescription ergocalciferol  50,000 IU weekly and requests a refill. Additionally, she is on fluoxetine  60 mg daily for depression associated with emotional eating and requests a refill.  She is taking Lasix  20 mg every other day for edema. She feels fluid retention.  She is on Zepbound  7.5 mg for her obstructive sleep apnea.      PHYSICAL EXAM:  Blood pressure 123/64, pulse 69, temperature 98.4 F (36.9 C), height 5' 1 (1.549 m), weight (!) 322 lb (146.1 kg), SpO2 97%. Body mass index is 60.84 kg/m.  DIAGNOSTIC DATA REVIEWED:  BMET    Component Value Date/Time   NA 141 07/06/2024 0846   K 5.1 07/06/2024 0846   CL 101 07/06/2024 0846   CO2 24 07/06/2024  0846   GLUCOSE 95 07/06/2024 0846   GLUCOSE 93 01/01/2018 1312   BUN 25 07/06/2024 0846   CREATININE 1.43 (H) 07/06/2024 0846   CALCIUM 10.1 07/06/2024 0846   GFRNONAA 55 (L) 10/15/2020 0806   GFRAA 63 10/15/2020 0806   Lab Results  Component Value Date   HGBA1C 5.0 05/24/2024   HGBA1C (L) 03/08/2008    3.6 (NOTE)   The ADA recommends the following therapeutic goals for glycemic   control related to Hgb A1C measurement:   Goal of Therapy:   < 7.0% Hgb A1C   Action Suggested:  > 8.0% Hgb A1C   Ref:  Diabetes Care, 22, Suppl. 1, 1999   Lab Results  Component Value Date   INSULIN  10.6 04/23/2023   INSULIN  16.9 12/30/2018   Lab Results  Component Value Date   TSH 1.16 05/24/2024   CBC    Component Value Date/Time   WBC 5.8 05/24/2024 0000   WBC 6.2 01/01/2018 1215   RBC 4.1 05/24/2024 0000   HGB 12.8 05/24/2024 0000   HGB 14.2 01/13/2022 0729   HGB 15.2 12/12/2008 0804   HCT 39 05/24/2024 0000   HCT 44.1 01/13/2022 0729   HCT 43.8 12/12/2008 0804   PLT 106 (A) 05/24/2024 0000   PLT 155 01/13/2022 0729   MCV 97 01/13/2022 0729   MCV 90.8 12/12/2008 0804   MCH 31.2 01/13/2022 0729   MCH 31.5 01/01/2018 1215   MCHC  32.2 01/13/2022 0729   MCHC 32.0 01/01/2018 1215   RDW 11.7 01/13/2022 0729   RDW 13.6 12/12/2008 0804   Iron Studies    Component Value Date/Time   IRON 136 05/24/2024 0000   TIBC 258 05/24/2024 0000   FERRITIN 416 05/24/2024 0000   IRONPCTSAT 26 07/21/2008 0753   Lipid Panel     Component Value Date/Time   CHOL 187 05/24/2024 0000   CHOL 274 (H) 04/23/2023 0941   TRIG 149 05/24/2024 0000   HDL 39 05/24/2024 0000   HDL 48 04/23/2023 0941   CHOLHDL 7.3 03/07/2008 0525   VLDL 38 03/07/2008 0525   LDLCALC 118 05/24/2024 0000   LDLCALC 188 (H) 04/23/2023 0941   Hepatic Function Panel     Component Value Date/Time   PROT 6.3 07/06/2024 0846   ALBUMIN 4.1 07/06/2024 0846   AST 17 07/06/2024 0846   ALT 10 07/06/2024 0846   ALKPHOS 111  07/06/2024 0846   BILITOT 0.8 07/06/2024 0846   BILIDIR 0.6 (H) 03/09/2008 0500   IBILI 4.2 (H) 03/06/2008 1500      Component Value Date/Time   TSH 1.16 05/24/2024 0000   TSH 1.370 04/23/2023 0941   Nutritional Lab Results  Component Value Date   VD25OH 29.5 05/24/2024   VD25OH 39.7 04/23/2023   VD25OH 28.0 05/15/2022     Assessment and Plan Assessment & Plan Obesity Gained 7 pounds in the last month, but overall weight loss of 71 pounds from starting weight. Following category three eating plan 75% of the time. Not currently exercising but trying to be more active. Muscle mass has decreased slightly. Hunger levels are well-managed. Current Zepbound  dose is 7.5 mg, and she feels it is appropriate. Discussed potential inaccuracies in water readings due to muscle mass and the impact of sodium intake on weight. - Continue category three eating plan - Encouraged regular exercise to maintain muscle mass - Monitor hidden calories, especially when eating out - Continue Zepbound  7.5 mg - Encouraged hydration and mindful sodium intake - Holiday eating strategies discussed  Vitamin D  deficiency Currently on prescription ergocalciferol  50,000 IU weekly. - Refilled ergocalciferol  50,000 IU weekly  Depression with emotional eating Currently managed with fluoxetine  60 mg daily. - Refilled fluoxetine  60 mg daily  Obstructive sleep apnea Currently managed with Zepbound  7.5 mg. - Continue Zepbound  7.5 mg  Edema Currently on Lasix  20 mg every other day. Discussed the possibility of increasing Lasix  to four consecutive days if fluid retention is significant. Creatinine levels have improved, and kidney function is being monitored. - Continue Lasix  20 mg every other day - Consider increasing Lasix  to four consecutive days if fluid retention is significant - Monitor kidney function and creatinine levels     Maria Stone was counseled on the importance of maintaining healthy lifestyle habits,  including balanced nutrition, regular physical activity, and behavioral modifications, while taking antiobesity medication.  Patient verbalized understanding that medication is an adjunct to, not a replacement for, lifestyle changes and that the long-term success and weight maintenance depend on continued adherence to these strategies.   Loda was informed of the importance of frequent follow up visits to maximize her success with intensive lifestyle modifications for her obesity and obesity related health conditions as recommended by USPSTF and CMS guidelines   Louann Penton, MD

## 2024-10-24 ENCOUNTER — Telehealth (INDEPENDENT_AMBULATORY_CARE_PROVIDER_SITE_OTHER): Payer: Self-pay

## 2024-10-24 NOTE — Telephone Encounter (Signed)
 Maria Stone (Key: [id number]) Zepbound  5MG /0.5ML pen-injectors

## 2024-10-25 NOTE — Telephone Encounter (Signed)
 Message from Plan Request Reference Number: [id number]. ZEPBOUND  INJ 5/0.5ML is approved through 11/09/2025.  Your patient may now fill this prescription and it will be covered.. Authorization Expiration Date: November 09, 2025.

## 2024-11-07 ENCOUNTER — Other Ambulatory Visit: Payer: Self-pay

## 2024-11-07 ENCOUNTER — Other Ambulatory Visit (HOSPITAL_COMMUNITY): Payer: Self-pay

## 2024-11-07 ENCOUNTER — Ambulatory Visit (INDEPENDENT_AMBULATORY_CARE_PROVIDER_SITE_OTHER): Payer: Self-pay | Admitting: Family Medicine

## 2024-11-07 ENCOUNTER — Encounter (INDEPENDENT_AMBULATORY_CARE_PROVIDER_SITE_OTHER): Payer: Self-pay | Admitting: Family Medicine

## 2024-11-07 DIAGNOSIS — E559 Vitamin D deficiency, unspecified: Secondary | ICD-10-CM

## 2024-11-07 DIAGNOSIS — E669 Obesity, unspecified: Secondary | ICD-10-CM

## 2024-11-07 DIAGNOSIS — G4733 Obstructive sleep apnea (adult) (pediatric): Secondary | ICD-10-CM | POA: Diagnosis not present

## 2024-11-07 DIAGNOSIS — F418 Other specified anxiety disorders: Secondary | ICD-10-CM | POA: Diagnosis not present

## 2024-11-07 DIAGNOSIS — I10 Essential (primary) hypertension: Secondary | ICD-10-CM | POA: Diagnosis not present

## 2024-11-07 DIAGNOSIS — Z6841 Body Mass Index (BMI) 40.0 and over, adult: Secondary | ICD-10-CM

## 2024-11-07 MED ORDER — VITAMIN D (ERGOCALCIFEROL) 1.25 MG (50000 UNIT) PO CAPS
ORAL_CAPSULE | ORAL | 0 refills | Status: AC
  Filled 2024-11-07: qty 12, 84d supply, fill #0

## 2024-11-07 MED ORDER — ZEPBOUND 7.5 MG/0.5ML ~~LOC~~ SOAJ
7.5000 mg | SUBCUTANEOUS | 0 refills | Status: DC
  Filled 2024-11-07 – 2024-11-30 (×3): qty 2, 28d supply, fill #0
  Filled ????-??-??: fill #0

## 2024-11-07 MED ORDER — LISINOPRIL 5 MG PO TABS
5.0000 mg | ORAL_TABLET | Freq: Every day | ORAL | 3 refills | Status: AC
  Filled 2024-11-07: qty 90, 90d supply, fill #0

## 2024-11-07 MED ORDER — FLUOXETINE HCL 60 MG PO TABS
1.0000 | ORAL_TABLET | Freq: Every day | ORAL | 0 refills | Status: AC
  Filled 2024-11-07: qty 90, 90d supply, fill #0

## 2024-11-07 NOTE — Progress Notes (Signed)
 "  Office: [phone]  /  Fax: [phone]  WEIGHT SUMMARY AND BIOMETRICS  Anthropometric Measurements Height: 5' 1 (1.549 m) Weight: (!) 321 lb (145.6 kg) BMI (Calculated): 60.68 Weight at Last Visit: 322lb Weight Lost Since Last Visit: 1lb Weight Gained Since Last Visit: 0lb Starting Weight: 393lb Total Weight Loss (lbs): 72 lb (32.7 kg) Peak Weight: 404lb   Body Composition  Body Fat %: 68.5 % Fat Mass (lbs): 220.4 lbs Muscle Mass (lbs): 96.2 lbs Visceral Fat Rating : 32   Other Clinical Data Fasting: yes Labs: No Today's Visit #: 79 Starting Date: 12/30/18    Chief Complaint: OBESITY   History of Present Illness Maria Stone is a 68 year old female who presents for a follow-up on her obesity treatment and progress.  She is adhering to a category three eating plan approximately 75% of the time and is attempting to increase her physical activity, although she is not engaging in formal exercise. She has lost one pound over the last five weeks, including the holiday period of Thanksgiving and Christmas. During the holidays, she maintained her weight loss by preparing healthier meals, such as cold cuts and fruit platters, and involving her family in meal preparation.  She is being treated for hypertension, currently controlled with lisinopril  5 mg daily. She requests a refill for this medication.  She has a vitamin D  deficiency managed with prescription ergocalciferol  50,000 IU weekly and requests a refill.  She is on Zepbound  7.5 mg for obstructive sleep apnea and requests a refill.  She takes fluoxetine  60 mg daily for depression with emotional eating behaviors and requests a refill. She experiences stress related to caregiving responsibilities for her mother, who is experiencing cognitive decline, and her friend Arland, who has diabetes.      PHYSICAL EXAM:  Blood pressure 123/63, pulse 84, temperature 98.4 F (36.9 C), height 5' 1 (1.549 m), weight  (!) 321 lb (145.6 kg), SpO2 99%. Body mass index is 60.65 kg/m.  DIAGNOSTIC DATA REVIEWED:  BMET    Component Value Date/Time   NA 141 07/06/2024 0846   K 5.1 07/06/2024 0846   CL 101 07/06/2024 0846   CO2 24 07/06/2024 0846   GLUCOSE 95 07/06/2024 0846   GLUCOSE 93 01/01/2018 1312   BUN 25 07/06/2024 0846   CREATININE 1.43 (H) 07/06/2024 0846   CALCIUM 10.1 07/06/2024 0846   GFRNONAA 55 (L) 10/15/2020 0806   GFRAA 63 10/15/2020 0806   Lab Results  Component Value Date   HGBA1C 5.0 05/24/2024   HGBA1C (L) 03/08/2008    3.6 (NOTE)   The ADA recommends the following therapeutic goals for glycemic   control related to Hgb A1C measurement:   Goal of Therapy:   < 7.0% Hgb A1C   Action Suggested:  > 8.0% Hgb A1C   Ref:  Diabetes Care, 22, Suppl. 1, 1999   Lab Results  Component Value Date   INSULIN  10.6 04/23/2023   INSULIN  16.9 12/30/2018   Lab Results  Component Value Date   TSH 1.16 05/24/2024   CBC    Component Value Date/Time   WBC 5.8 05/24/2024 0000   WBC 6.2 01/01/2018 1215   RBC 4.1 05/24/2024 0000   HGB 12.8 05/24/2024 0000   HGB 14.2 01/13/2022 0729   HGB 15.2 12/12/2008 0804   HCT 39 05/24/2024 0000   HCT 44.1 01/13/2022 0729   HCT 43.8 12/12/2008 0804   PLT 106 (A) 05/24/2024 0000   PLT 155 01/13/2022 0729  MCV 97 01/13/2022 0729   MCV 90.8 12/12/2008 0804   MCH 31.2 01/13/2022 0729   MCH 31.5 01/01/2018 1215   MCHC 32.2 01/13/2022 0729   MCHC 32.0 01/01/2018 1215   RDW 11.7 01/13/2022 0729   RDW 13.6 12/12/2008 0804   Iron Studies    Component Value Date/Time   IRON 136 05/24/2024 0000   TIBC 258 05/24/2024 0000   FERRITIN 416 05/24/2024 0000   IRONPCTSAT 26 07/21/2008 0753   Lipid Panel     Component Value Date/Time   CHOL 187 05/24/2024 0000   CHOL 274 (H) 04/23/2023 0941   TRIG 149 05/24/2024 0000   HDL 39 05/24/2024 0000   HDL 48 04/23/2023 0941   CHOLHDL 7.3 03/07/2008 0525   VLDL 38 03/07/2008 0525   LDLCALC 118 05/24/2024  0000   LDLCALC 188 (H) 04/23/2023 0941   Hepatic Function Panel     Component Value Date/Time   PROT 6.3 07/06/2024 0846   ALBUMIN 4.1 07/06/2024 0846   AST 17 07/06/2024 0846   ALT 10 07/06/2024 0846   ALKPHOS 111 07/06/2024 0846   BILITOT 0.8 07/06/2024 0846   BILIDIR 0.6 (H) 03/09/2008 0500   IBILI 4.2 (H) 03/06/2008 1500      Component Value Date/Time   TSH 1.16 05/24/2024 0000   TSH 1.370 04/23/2023 0941   Nutritional Lab Results  Component Value Date   VD25OH 29.5 05/24/2024   VD25OH 39.7 04/23/2023   VD25OH 28.0 05/15/2022     Assessment and Plan Assessment & Plan Obesity Management is ongoing with a focus on dietary changes and increased physical activity. She has lost one pound over the last five weeks, which is notable given the holiday period. Muscle mass has remained stable, which is positive given her weight loss. She is not currently engaging in formal exercise but is encouraged to increase physical activity. - Continue category three eating plan - Encouraged 150 minutes of cardio exercise per week, starting with 10 minutes daily - Suggested chair yoga or chair aerobics as starting points for exercise - Sent prescription for Zepbound  to [facility]  Obstructive sleep apnea Managed with Zepbound  at a dose of 7.5 mg. She requests a refill of her medication. She is using her CPAP regularly. - Sent prescription for Zepbound  to [facility], exercise and weight loss as discussed today as an important part of the treatment plan   Hypertension Well-controlled with a blood pressure reading of 123/63 mmHg. She is on lisinopril  5 mg daily and requests a refill. - Refilled lisinopril  5 mg daily  Depression with anxiety and caregiver fatigue Managed with fluoxetine  60 mg daily. She requests a refill of her medication. She is handleing her stress level reasonably well currently. - Refilled fluoxetine  60 mg daily  Vitamin D  deficiency Managed with  prescription ergocalciferol  50,000 IU weekly. She requests a refill of her medication. - Refilled ergocalciferol  50,000 IU weekly      Patients who are on anti-obesity medications are counseled on the importance of maintaining healthy lifestyle habits, including balanced nutrition, regular physical activity, and behavioral modifications,  Medication is an adjunct to, not a replacement for, lifestyle changes and that the long-term success and weight maintenance depend on continued adherence to these strategies.   Amiyrah was informed of the importance of frequent follow up visits to maximize her success with intensive lifestyle modifications for her obesity and obesity related health conditions as recommended by USPSTF and CMS guidelines   [facility],  MD   "

## 2024-11-08 ENCOUNTER — Other Ambulatory Visit: Payer: Self-pay

## 2024-11-09 ENCOUNTER — Other Ambulatory Visit (HOSPITAL_COMMUNITY): Payer: Self-pay

## 2024-11-09 ENCOUNTER — Other Ambulatory Visit: Payer: Self-pay

## 2024-11-09 ENCOUNTER — Encounter: Payer: Self-pay | Admitting: Pharmacist

## 2024-11-21 ENCOUNTER — Other Ambulatory Visit: Payer: Self-pay

## 2024-11-21 ENCOUNTER — Other Ambulatory Visit (HOSPITAL_COMMUNITY): Payer: Self-pay

## 2024-11-30 ENCOUNTER — Other Ambulatory Visit: Payer: Self-pay

## 2024-11-30 ENCOUNTER — Other Ambulatory Visit (HOSPITAL_COMMUNITY): Payer: Self-pay

## 2024-12-05 ENCOUNTER — Ambulatory Visit (INDEPENDENT_AMBULATORY_CARE_PROVIDER_SITE_OTHER): Admitting: Family Medicine

## 2024-12-05 ENCOUNTER — Encounter (INDEPENDENT_AMBULATORY_CARE_PROVIDER_SITE_OTHER): Payer: Self-pay

## 2024-12-06 ENCOUNTER — Other Ambulatory Visit (INDEPENDENT_AMBULATORY_CARE_PROVIDER_SITE_OTHER): Payer: Self-pay

## 2024-12-06 ENCOUNTER — Other Ambulatory Visit (HOSPITAL_COMMUNITY): Payer: Self-pay

## 2024-12-06 DIAGNOSIS — R600 Localized edema: Secondary | ICD-10-CM

## 2024-12-06 MED ORDER — ZEPBOUND 7.5 MG/0.5ML ~~LOC~~ SOAJ
7.5000 mg | SUBCUTANEOUS | 0 refills | Status: AC
  Filled 2024-12-06 – 2024-12-07 (×2): qty 2, 28d supply, fill #0

## 2024-12-06 MED ORDER — FUROSEMIDE 20 MG PO TABS
ORAL_TABLET | ORAL | 0 refills | Status: AC
  Filled 2024-12-06: qty 30, 30d supply, fill #0

## 2024-12-06 NOTE — Telephone Encounter (Signed)
 LAST APPOINTMENT DATE: 11/07/2024 NEXT APPOINTMENT DATE: 12/05/2024 (weather) 01/04/2025   [id number] - [place], [place] - [address] [place] [address] Phone: [phone] Fax: [phone]  [facility] - [place], [place] - [address] [facility] [address] [place] [address] [place] [address] Phone: [phone] Fax: [phone]  [facility] - [place], [place] - [address] [place] [address] Phone: [phone] Fax: [phone]  [facility] [address] [place] [address] Phone: [phone] Fax: [phone]  Patient is requesting a refill of the following medications: Requested Prescriptions   Pending Prescriptions Disp Refills   tirzepatide  (ZEPBOUND ) 7.5 MG/0.5ML Pen 2 mL 0    Sig: Inject 7.5 mg into the skin once a week.   furosemide  (LASIX ) 20 MG tablet 30 tablet 0    Sig: 1 DAILY AS DIRECTED ORAL ONCE A DAY 90 DAYS ORALLY ONCE A DAY 90 DAYS    Date last filled: 09/28/2024 & 11/07/2024 Previously prescribed by Dr Verdon  Lab Results  Component Value Date   HGBA1C 5.0 05/24/2024   HGBA1C 5.4 04/23/2023   HGBA1C 5.2 05/15/2022   Lab Results  Component Value Date   LDLCALC 118 05/24/2024   CREATININE 1.43 (H) 07/06/2024   Lab Results  Component Value Date   VD25OH 29.5 05/24/2024   VD25OH 39.7 04/23/2023   VD25OH 28.0 05/15/2022    BP Readings from Last 3 Encounters:  11/07/24 123/63  09/28/24 123/64  08/31/24 101/62

## 2024-12-07 ENCOUNTER — Other Ambulatory Visit (HOSPITAL_COMMUNITY): Payer: Self-pay

## 2025-01-04 ENCOUNTER — Ambulatory Visit (INDEPENDENT_AMBULATORY_CARE_PROVIDER_SITE_OTHER): Admitting: Family Medicine
